# Patient Record
Sex: Female | Born: 1970
Health system: Southern US, Community
[De-identification: ages and names within clinical notes are randomized; demographics above are authoritative.]

## PROBLEM LIST (undated history)

## (undated) DIAGNOSIS — K219 Gastro-esophageal reflux disease without esophagitis: Secondary | ICD-10-CM

## (undated) DIAGNOSIS — R079 Chest pain, unspecified: Secondary | ICD-10-CM

## (undated) DIAGNOSIS — F419 Anxiety disorder, unspecified: Secondary | ICD-10-CM

## (undated) DIAGNOSIS — N390 Urinary tract infection, site not specified: Secondary | ICD-10-CM

## (undated) DIAGNOSIS — K7460 Unspecified cirrhosis of liver: Secondary | ICD-10-CM

## (undated) DIAGNOSIS — E538 Deficiency of other specified B group vitamins: Secondary | ICD-10-CM

## (undated) DIAGNOSIS — R7303 Prediabetes: Secondary | ICD-10-CM

## (undated) DIAGNOSIS — K760 Fatty (change of) liver, not elsewhere classified: Secondary | ICD-10-CM

## (undated) DIAGNOSIS — E559 Vitamin D deficiency, unspecified: Secondary | ICD-10-CM

## (undated) DIAGNOSIS — M549 Dorsalgia, unspecified: Secondary | ICD-10-CM

## (undated) DIAGNOSIS — Z8711 Personal history of peptic ulcer disease: Secondary | ICD-10-CM

## (undated) DIAGNOSIS — M2550 Pain in unspecified joint: Secondary | ICD-10-CM

## (undated) DIAGNOSIS — R1310 Dysphagia, unspecified: Secondary | ICD-10-CM

## (undated) DIAGNOSIS — F32A Depression, unspecified: Secondary | ICD-10-CM

## (undated) DIAGNOSIS — F329 Major depressive disorder, single episode, unspecified: Secondary | ICD-10-CM

## (undated) DIAGNOSIS — E669 Obesity, unspecified: Secondary | ICD-10-CM

## (undated) DIAGNOSIS — K829 Disease of gallbladder, unspecified: Secondary | ICD-10-CM

## (undated) DIAGNOSIS — K8020 Calculus of gallbladder without cholecystitis without obstruction: Secondary | ICD-10-CM

## (undated) DIAGNOSIS — K589 Irritable bowel syndrome without diarrhea: Secondary | ICD-10-CM

## (undated) HISTORY — DX: Personal history of peptic ulcer disease: Z87.11

## (undated) HISTORY — DX: Calculus of gallbladder without cholecystitis without obstruction: K80.20

## (undated) HISTORY — PX: TOTAL VAGINAL HYSTERECTOMY: SHX2548

## (undated) HISTORY — DX: Gastro-esophageal reflux disease without esophagitis: K21.9

## (undated) HISTORY — DX: Irritable bowel syndrome, unspecified: K58.9

## (undated) HISTORY — DX: Obesity, unspecified: E66.9

## (undated) HISTORY — PX: IUD REMOVAL: SHX5392

## (undated) HISTORY — DX: Urinary tract infection, site not specified: N39.0

## (undated) HISTORY — PX: BREAST BIOPSY: SHX20

## (undated) HISTORY — DX: Dysphagia, unspecified: R13.10

## (undated) HISTORY — DX: Fatty (change of) liver, not elsewhere classified: K76.0

## (undated) HISTORY — PX: OTHER SURGICAL HISTORY: SHX169

## (undated) HISTORY — DX: Vitamin D deficiency, unspecified: E55.9

## (undated) HISTORY — DX: Disease of gallbladder, unspecified: K82.9

## (undated) HISTORY — DX: Chest pain, unspecified: R07.9

## (undated) HISTORY — DX: Deficiency of other specified B group vitamins: E53.8

## (undated) HISTORY — PX: UPPER GASTROINTESTINAL ENDOSCOPY: SHX188

## (undated) HISTORY — DX: Anxiety disorder, unspecified: F41.9

## (undated) HISTORY — DX: Unspecified cirrhosis of liver: K74.60

## (undated) HISTORY — PX: REPAIR RECTOCELE: SUR1206

## (undated) HISTORY — DX: Prediabetes: R73.03

## (undated) HISTORY — DX: Depression, unspecified: F32.A

## (undated) HISTORY — DX: Pain in unspecified joint: M25.50

## (undated) HISTORY — DX: Major depressive disorder, single episode, unspecified: F32.9

## (undated) HISTORY — DX: Dorsalgia, unspecified: M54.9

---

## 2002-11-11 DIAGNOSIS — K8020 Calculus of gallbladder without cholecystitis without obstruction: Secondary | ICD-10-CM

## 2002-11-11 HISTORY — PX: CHOLECYSTECTOMY: SHX55

## 2002-11-11 HISTORY — DX: Calculus of gallbladder without cholecystitis without obstruction: K80.20

## 2006-11-21 ENCOUNTER — Ambulatory Visit: Payer: Self-pay | Admitting: Oncology

## 2007-01-13 ENCOUNTER — Ambulatory Visit: Payer: Self-pay | Admitting: Oncology

## 2008-05-31 HISTORY — PX: ESOPHAGOGASTRODUODENOSCOPY: SHX1529

## 2009-08-07 HISTORY — PX: COLONOSCOPY: SHX174

## 2011-05-27 ENCOUNTER — Other Ambulatory Visit: Payer: Self-pay | Admitting: Emergency Medicine

## 2011-05-27 ENCOUNTER — Ambulatory Visit
Admission: RE | Admit: 2011-05-27 | Discharge: 2011-05-27 | Disposition: A | Discharge status: Home / Self Care | Payer: Self-pay | Source: Ambulatory Visit | Attending: Emergency Medicine | Admitting: Emergency Medicine

## 2011-05-27 MED ORDER — IOHEXOL 300 MG/ML  SOLN: 125.0000 mL | Freq: Once | INTRAMUSCULAR | Status: AC | PRN

## 2018-08-10 ENCOUNTER — Encounter: Payer: Self-pay | Admitting: Gastroenterology

## 2018-08-18 ENCOUNTER — Encounter: Payer: Self-pay | Admitting: Gastroenterology

## 2018-08-19 ENCOUNTER — Ambulatory Visit: Payer: BLUE CROSS/BLUE SHIELD | Admitting: Gastroenterology

## 2018-08-19 ENCOUNTER — Encounter: Payer: Self-pay | Admitting: Gastroenterology

## 2018-08-19 VITALS — BP 108/70 | HR 74 | Ht 66.0 in | Wt 293.5 lb

## 2018-08-19 DIAGNOSIS — K760 Fatty (change of) liver, not elsewhere classified: Secondary | ICD-10-CM | POA: Diagnosis not present

## 2018-08-19 DIAGNOSIS — K580 Irritable bowel syndrome with diarrhea: Secondary | ICD-10-CM | POA: Diagnosis not present

## 2018-08-19 DIAGNOSIS — K625 Hemorrhage of anus and rectum: Secondary | ICD-10-CM | POA: Diagnosis not present

## 2018-08-19 MED ORDER — DICYCLOMINE HCL 20 MG PO TABS: ORAL_TABLET | ORAL | 3 refills | Status: DC

## 2018-08-19 NOTE — Patient Instructions (Signed)
If you are age 47 or older, your body mass index should be between 23-30. Your Body mass index is 47.37 kg/m. If this is out of the aforementioned range listed, please consider follow up with your Primary Care Provider.  If you are age 7 or younger, your body mass index should be between 19-25. Your Body mass index is 47.37 kg/m. If this is out of the aformentioned range listed, please consider follow up with your Primary Care Provider.   We have sent the following medications to your pharmacy for you to pick up at your convenience: Bentyl 20 mg 1/2 hour before lunch and at bedtime.   Please have your primary care doctor to add celiac screen to the labs he has drawn.   Please call Dr. Leland Her nurse Vaughan Basta, RN)  in 2 weeks at [phone]  to let her now how you are doing.   You will be due for a recall colonoscopy in 11/2018. We will send you a reminder in the mail when it gets closer to that time.   Thank you,  Dr. [name]

## 2018-08-19 NOTE — Progress Notes (Signed)
Chief Complaint: Diarrhea  Referring Provider:  Dr Jannette Fogo      ASSESSMENT AND PLAN;   #1. IBS with diarrhea (occ constipation), neg stool studies.  Element of Postcholecystectomy diarrhea, neg colon with Bx 07/2009.  #2. Rectal bleeding (resolved).  #3. Fatty liver with Nl LFTs (CT 04/2009)  Plan: - Proceed with "Diet 56" then, if still with problems, gluten free diet. - Gradual weight loss. - Bentyl 20 mg p.o. twice daily half an hour before meals and bedtime. - Proceed with colonoscopy for further evaluation.  This will be scheduled in January 2020. Earlier, if still with problems. - If still with problems, trial of cholestyramine 4 g twice daily. - if still with problems, will give her a trial of amitriptyline 25 mg p.o. nightly. - Patient will make appointment with Dr. Jannette Fogo- for annual physical examination and labs. Also, given her written instructions to get celiac panel. HPI:    Jacqueline Fowler is a 47 y.o. female  Seen at req of Dr Jannette Fogo C/O  diarhea with rare constipation x several years, getting worse over the last 1 year. 2-3/day after eating foods esp salads She also has minimal lower abdominal pain which gets better with defecation. Bloating which gets better with bowel movements as well No nocturnal symptoms No melena or hematochezia Previously had colonoscopy 07/2009-normal to TI, negative random colonic and TI biopsies. Rare bright red blood per rectum. No nausea, vomiting, heartburn, regurgitation, odynophagia or dysphagia. There is no melena. No unintentional weight loss.    Past Medical History:  Diagnosis Date  . Anxiety   . Depression   . Gallstones 2004  . IBS (irritable bowel syndrome)   . Obesity   . UTI (urinary tract infection)     Past Surgical History:  Procedure Laterality Date  . BREAST BIOPSY Right    age 17 or 31 cyst that was removed  . CHOLECYSTECTOMY  2004  . COLONOSCOPY  08/07/2009   Small internal hemorrhoids. Otherwise normal  colonoscopy to TI.   Marland Kitchen ESOPHAGOGASTRODUODENOSCOPY  05/31/2008   Esophageal stricture status post dilation. Mild gastritis.   . IUD REMOVAL      Family History  Problem Relation Age of Onset  . Colon cancer Neg Hx   . Esophageal cancer Neg Hx     Social History   Tobacco Use  . Smoking status: Former Research scientist (life sciences)  . Smokeless tobacco: Never Used  . Tobacco comment: quit 2001 smoking  Substance Use Topics  . Alcohol use: Yes    Comment: ocassionally  . Drug use: Never    Current Outpatient Medications  Medication Sig Dispense Refill  . Brexpiprazole (REXULTI) 2 MG TABS 1 tablet daily.    . busPIRone (BUSPAR) 15 MG tablet     . diazepam (VALIUM) 2 MG tablet   4  . omeprazole (PRILOSEC) 20 MG capsule daily.    . sertraline (ZOLOFT) 100 MG tablet daily.     No current facility-administered medications for this visit.     Not on File  Review of Systems:  Constitutional: Denies fever, chills, diaphoresis, appetite change and fatigue.  HEENT: Denies photophobia, eye pain, redness, hearing loss, ear pain, congestion, sore throat, rhinorrhea, sneezing, mouth sores, neck pain, neck stiffness and tinnitus.   Respiratory: Denies SOB, DOE, chest tightness,  and wheezing.   Cardiovascular: Denies chest pain, palpitations and leg swelling.  Genitourinary: Denies dysuria, urgency, frequency, hematuria, flank pain and difficulty urinating.  Musculoskeletal: Denies myalgias, has back pain. No joint  swelling, arthralgias and gait problem.  Skin: No rash.  Neurological: Denies dizziness, seizures, syncope, weakness, light-headedness, numbness and has headaches.  Hematological: Denies adenopathy. Easy bruising, personal or family bleeding history  Psychiatric/Behavioral: Has anxiety, depression, sleeping problems     Physical Exam:    BP 108/70   Pulse 74   Ht 5\' 6"  (1.676 m)   Wt 293 lb 8 oz (133.1 kg)   BMI 47.37 kg/m  Filed Weights   08/19/18 1010  Weight: 293 lb 8 oz (133.1 kg)     Constitutional:  Well-developed, in no acute distress. Psychiatric: Normal mood and affect. Behavior is normal. HEENT: Pupils normal.  Conjunctivae are normal. No scleral icterus. Neck supple.  Cardiovascular: Normal rate, regular rhythm. No edema Pulmonary/chest: Effort normal and breath sounds normal. No wheezing, rales or rhonchi. Abdominal: Soft, nondistended. Nontender. Bowel sounds active throughout. There are no masses palpable. No hepatomegaly. Rectal:  defered Neurological: Alert and oriented to person place and time. Skin: Skin is warm and dry. No rashes noted.    Carmell Austria, MD 08/19/2018, 10:25 AM  Cc: Dr Jannette Fogo

## 2018-10-02 ENCOUNTER — Telehealth: Payer: Self-pay

## 2018-10-02 NOTE — Telephone Encounter (Signed)
PA approved for Rexulti 2mg  through 09/30/2021

## 2018-12-15 ENCOUNTER — Encounter: Payer: Self-pay | Admitting: Emergency Medicine

## 2018-12-15 DIAGNOSIS — F39 Unspecified mood [affective] disorder: Secondary | ICD-10-CM | POA: Insufficient documentation

## 2018-12-15 DIAGNOSIS — F411 Generalized anxiety disorder: Secondary | ICD-10-CM | POA: Insufficient documentation

## 2019-01-07 ENCOUNTER — Ambulatory Visit: Payer: BLUE CROSS/BLUE SHIELD | Admitting: Physician Assistant

## 2019-01-07 ENCOUNTER — Encounter (INDEPENDENT_AMBULATORY_CARE_PROVIDER_SITE_OTHER): Payer: Self-pay

## 2019-01-07 ENCOUNTER — Other Ambulatory Visit (INDEPENDENT_AMBULATORY_CARE_PROVIDER_SITE_OTHER): Payer: BLUE CROSS/BLUE SHIELD

## 2019-01-07 ENCOUNTER — Encounter: Payer: Self-pay | Admitting: Physician Assistant

## 2019-01-07 VITALS — BP 112/74 | HR 78 | Ht 66.0 in | Wt 306.1 lb

## 2019-01-07 DIAGNOSIS — R194 Change in bowel habit: Secondary | ICD-10-CM

## 2019-01-07 DIAGNOSIS — R109 Unspecified abdominal pain: Secondary | ICD-10-CM

## 2019-01-07 DIAGNOSIS — K625 Hemorrhage of anus and rectum: Secondary | ICD-10-CM | POA: Diagnosis not present

## 2019-01-07 LAB — IGA: IgA: 114 mg/dL (ref 68–378)

## 2019-01-07 MED ORDER — CHOLESTYRAMINE 4 G PO PACK: 4.0000 g | PACK | Freq: Two times a day (BID) | ORAL | 12 refills | Status: DC

## 2019-01-07 MED ORDER — SOD PICOSULFATE-MAG OX-CIT ACD 10-3.5-12 MG-GM -GM/160ML PO SOLN: 1.0000 | ORAL | 0 refills | Status: DC

## 2019-01-07 NOTE — Progress Notes (Signed)
Thx for taking care of her. Agree with the plan

## 2019-01-07 NOTE — Progress Notes (Signed)
Chief Complaint: "Feeling tired and having irregular bowel movements"  HPI:    [name] is a 48 year old female, known to Dr. Lyndel Safe, with a past medical history of irritable bowel syndrome with diarrhea, depression and anxiety, who presents to clinic today with a complaint of "feeling tired and having irregular bowel movements".    08/19/2018 office visit with Dr. Lyndel Safe.  At that time complained of diarrhea with rare constipation over the past several years getting worse over the past year, also with minimal lower abdominal pain with defecation and some bloating.  Previously had colonoscopy 07/2009-normal TI, negative random colonic and TI biopsies, rare bright red blood per rectum.  At that time, it was discussed patient had an element of postcholecystectomy diarrhea with a negative colon biopsy 07/2009 and negative stool studies.  She was given Bentyl 20 mg p.o. twice daily half an hour before meals and bedtime and scheduled for a colonoscopy.  If still with problems recommended trial of Cholestyramine 4 g twice daily and then if still with problems Amitriptyline 25 mg p.o. nightly.    Today, the patient presents clinic and explains that the Bentyl is not helping her.  Admittedly she is only typically taking 20 mg at night and forgets to take one before eating lunch in the middle of the day.  Continues with urgent mostly loose stools at least 8-10 times a day associated with a generalized abdominal pressure/cramping and occasional blood when she wipes.  New to her is some mucus that she is also seeing at times.  Associated symptoms include nausea and fatigue.    Denies weight loss, vomiting or symptoms that awaken her from sleep.     Past Medical History:  Diagnosis Date  . Anxiety   . Depression   . Gallstones 2004  . IBS (irritable bowel syndrome)   . Obesity   . UTI (urinary tract infection)     Past Surgical History:  Procedure Laterality Date  . BREAST BIOPSY Right    age 57 or 40 cyst  that was removed  . CHOLECYSTECTOMY  2004  . COLONOSCOPY  08/07/2009   Small internal hemorrhoids. Otherwise normal colonoscopy to TI.   Marland Kitchen ESOPHAGOGASTRODUODENOSCOPY  05/31/2008   Esophageal stricture status post dilation. Mild gastritis.   . IUD REMOVAL      Current Outpatient Medications  Medication Sig Dispense Refill  . Brexpiprazole (REXULTI) 2 MG TABS 1 tablet daily.    . busPIRone (BUSPAR) 15 MG tablet     . diazepam (VALIUM) 2 MG tablet   4  . dicyclomine (BENTYL) 20 MG tablet Take one 1/2 hour before lunch and at bedtime. 60 tablet 3  . omeprazole (PRILOSEC) 20 MG capsule daily.    . sertraline (ZOLOFT) 100 MG tablet daily.     No current facility-administered medications for this visit.     Allergies as of 01/07/2019  . (No Known Allergies)    Family History  Problem Relation Age of Onset  . Colon cancer Neg Hx   . Esophageal cancer Neg Hx     Social History   Socioeconomic History  . Marital status: Married    Spouse name: Not on file  . Number of children: 4  . Years of education: Not on file  . Highest education level: Not on file  Occupational History  . Not on file  Social Needs  . Financial resource strain: Not on file  . Food insecurity:    Worry: Not on file  Inability: Not on file  . Transportation needs:    Medical: Not on file    Non-medical: Not on file  Tobacco Use  . Smoking status: Former Research scientist (life sciences)  . Smokeless tobacco: Never Used  . Tobacco comment: quit 2001 smoking  Substance and Sexual Activity  . Alcohol use: Yes    Comment: ocassionally  . Drug use: Never  . Sexual activity: Not on file  Lifestyle  . Physical activity:    Days per week: Not on file    Minutes per session: Not on file  . Stress: Not on file  Relationships  . Social connections:    Talks on phone: Not on file    Gets together: Not on file    Attends religious service: Not on file    Active member of club or organization: Not on file    Attends meetings  of clubs or organizations: Not on file    Relationship status: Not on file  . Intimate partner violence:    Fear of current or ex partner: Not on file    Emotionally abused: Not on file    Physically abused: Not on file    Forced sexual activity: Not on file  Other Topics Concern  . Not on file  Social History Narrative  . Not on file    Review of Systems:    Constitutional: No weight loss, fever or chills Cardiovascular: No chest pain  Respiratory: No SOB  Gastrointestinal: See HPI and otherwise negative   Physical Exam:  Vital signs: BP 112/74   Pulse 78   Ht 5\' 6"  (1.676 m)   Wt (!) 306 lb 2 oz (138.9 kg)   BMI 49.41 kg/m   Constitutional:   Pleasant obese Caucasian female appears to be in NAD, Well developed, Well nourished, alert and cooperative Respiratory: Respirations even and unlabored. Lungs clear to auscultation bilaterally.   No wheezes, crackles, or rhonchi.  Cardiovascular: Normal S1, S2. No MRG. Regular rate and rhythm. No peripheral edema, cyanosis or pallor.  Gastrointestinal:  Soft, nondistended, mild generalized ttp. No rebound or guarding. Normal bowel sounds. No appreciable masses or hepatomegaly. Psychiatric: Demonstrates good judgement and reason without abnormal affect or behaviors.  No recent labs or imaging.  Assessment: 1.  Change in bowel habits: 8-10 loose urgent stools per day, no better with Dicyclomine daily; Consider IBS-D +/- postcholecystectomy diarrhea 2.  Abdominal pain: Generalized, typically worse before bowel movement; likely IBS 3.  Rectal bleeding: Occasionally when the patient wipes, last colonoscopy in 2010; likely hemorrhoidal from frequent stools  Plan: 1.  Scheduled patient for colonoscopy in the [place] with Dr. Lyndel Safe, as this was recommended at her last appointment and never got scheduled.  Did discuss risks, benefits, limitations and alternatives and the patient agrees to proceed. 2.  Discussed that I still feel as though this is  likely IBS +/- some postcholecystectomy diarrhea. 3.  At this time will try Cholestyramine 4 g twice daily.  If this does not work could try increasing Dicyclomine to 20 mg 4 times daily to see if this helps with cramping and diarrhea, pending colonoscopy results. 4.  Ordered labs to consider celiac disease 5.  Patient to follow in clinic per recommendations from Dr. Lyndel Safe after time of procedure.  Ellouise Newer, PA-C [facility] 01/07/2019, 1:34 PM  Cc: Raelyn Number, MD

## 2019-01-07 NOTE — Patient Instructions (Addendum)
If you are age 48 or older, your body mass index should be between 23-30. Your Body mass index is 49.41 kg/m. If this is out of the aforementioned range listed, please consider follow up with your Primary Care Provider.  If you are age 35 or younger, your body mass index should be between 19-25. Your Body mass index is 49.41 kg/m. If this is out of the aformentioned range listed, please consider follow up with your Primary Care Provider.   You have been scheduled for a colonoscopy. Please follow written instructions given to you at your visit today.  Please pick up your prep supplies at the pharmacy within the next 1-3 days. If you use inhalers (even only as needed), please bring them with you on the day of your procedure. Your physician has requested that you go to www.startemmi.com and enter the access code given to you at your visit today. This web site gives a general overview about your procedure. However, you should still follow specific instructions given to you by our office regarding your preparation for the procedure.  We have sent the following medications to your pharmacy for you to pick up at your convenience: Clenpiq Cholestyramine  Your provider has requested that you go to the basement level for lab work before leaving today. Press "B" on the elevator. The lab is located at the first door on the left as you exit the elevator.   Thank you for choosing me and [facility].    Ellouise Newer, PA-C

## 2019-01-08 ENCOUNTER — Telehealth: Payer: Self-pay | Admitting: Physician Assistant

## 2019-01-08 NOTE — Telephone Encounter (Signed)
PT states that she needs pre Authorization for the suprep that was sent.

## 2019-01-08 NOTE — Telephone Encounter (Signed)
PA completed and coupon code given to pharmacist for [facility]. Patient advised.

## 2019-01-08 NOTE — Telephone Encounter (Signed)
Any medication related calls need to be forwarded to the [place].  Please route accordingly.  Thank you

## 2019-01-08 NOTE — Telephone Encounter (Signed)
Pt stated that pharmacy required PA even after pt gave them the Suprep coupon.

## 2019-01-13 ENCOUNTER — Encounter: Payer: Self-pay | Admitting: Gastroenterology

## 2019-01-13 ENCOUNTER — Other Ambulatory Visit: Payer: Self-pay

## 2019-01-13 ENCOUNTER — Ambulatory Visit (AMBULATORY_SURGERY_CENTER): Payer: BLUE CROSS/BLUE SHIELD | Admitting: Gastroenterology

## 2019-01-13 VITALS — BP 131/76 | HR 80 | Temp 97.3°F | Resp 16 | Ht 66.0 in | Wt 306.0 lb

## 2019-01-13 DIAGNOSIS — K648 Other hemorrhoids: Secondary | ICD-10-CM

## 2019-01-13 DIAGNOSIS — K635 Polyp of colon: Secondary | ICD-10-CM | POA: Diagnosis not present

## 2019-01-13 DIAGNOSIS — D122 Benign neoplasm of ascending colon: Secondary | ICD-10-CM

## 2019-01-13 DIAGNOSIS — R194 Change in bowel habit: Secondary | ICD-10-CM

## 2019-01-13 MED ORDER — SODIUM CHLORIDE 0.9 % IV SOLN: 500.0000 mL | Freq: Once | INTRAVENOUS | Status: DC

## 2019-01-13 NOTE — Progress Notes (Signed)
No problems noted in the recovery room. maw 

## 2019-01-13 NOTE — Progress Notes (Signed)
Pt awake. VSS report given to RN. No anesthetic complications noted 

## 2019-01-13 NOTE — Patient Instructions (Addendum)
YOU HAD AN ENDOSCOPIC PROCEDURE TODAY AT [facility]:   Refer to the procedure report that was given to you for any specific questions about what was found during the examination.  If the procedure report does not answer your questions, please call your gastroenterologist to clarify.  If you requested that your care partner not be given the details of your procedure findings, then the procedure report has been included in a sealed envelope for you to review at your convenience later.  YOU SHOULD EXPECT: Some feelings of bloating in the abdomen. Passage of more gas than usual.  Walking can help get rid of the air that was put into your GI tract during the procedure and reduce the bloating. If you had a lower endoscopy (such as a colonoscopy or flexible sigmoidoscopy) you may notice spotting of blood in your stool or on the toilet paper. If you underwent a bowel prep for your procedure, you may not have a normal bowel movement for a few days.  Please Note:  You might notice some irritation and congestion in your nose or some drainage.  This is from the oxygen used during your procedure.  There is no need for concern and it should clear up in a day or so.  SYMPTOMS TO REPORT IMMEDIATELY:   Following lower endoscopy (colonoscopy or flexible sigmoidoscopy):  Excessive amounts of blood in the stool  Significant tenderness or worsening of abdominal pains  Swelling of the abdomen that is new, acute  Fever of 100F or higher    For urgent or emergent issues, a gastroenterologist can be reached at any hour by calling [phone].   DIET:  We do recommend a small meal at first, but then you may proceed to your regular diet.  Drink plenty of fluids but you should avoid alcoholic beverages for 24 hours.  ACTIVITY:  You should plan to take it easy for the rest of today and you should NOT DRIVE or use heavy machinery until tomorrow (because of the sedation medicines used during the test).     FOLLOW UP: Our staff will call the number listed on your records the next business day following your procedure to check on you and address any questions or concerns that you may have regarding the information given to you following your procedure. If we do not reach you, we will leave a message.  However, if you are feeling well and you are not experiencing any problems, there is no need to return our call.  We will assume that you have returned to your regular daily activities without incident.  If any biopsies were taken you will be contacted by phone or by letter within the next 1-3 weeks.  Please call us at [phone] if you have not heard about the biopsies in 3 weeks.    SIGNATURES/CONFIDENTIALITY: You and/or your care partner have signed paperwork which will be entered into your electronic medical record.  These signatures attest to the fact that that the information above on your After Visit Summary has been reviewed and is understood.  Full responsibility of the confidentiality of this discharge information lies with you and/or your care-partner.    Handouts were given to your care partner on polyps and hemorrhoids. You may resume your current medications today. Await biopsy results. Return to GI clinic in 12 weeks.  Dr. Steve Rattler staff will call you with this appointment. Please call if any questions or concerns.

## 2019-01-13 NOTE — Op Note (Signed)
[facility] Patient Name: Jacqueline Fowler Procedure Date: 01/13/2019 10:03 AM MRN: [id number] Endoscopist: [name] , MD Age: 48 Referring MD:  Date of Birth: [birth year] Gender: Female Account #: [id number] Procedure:                Colonoscopy Indications:              Clinically significant diarrhea of unexplained                            origin, history of rectal bleeding. Medicines:                Monitored Anesthesia Care Procedure:                Pre-Anesthesia Assessment:                           - Prior to the procedure, a History and Physical                            was performed, and patient medications and                            allergies were reviewed. The patient's tolerance of                            previous anesthesia was also reviewed. The risks                            and benefits of the procedure and the sedation                            options and risks were discussed with the patient.                            All questions were answered, and informed consent                            was obtained. Prior Anticoagulants: The patient has                            taken no previous anticoagulant or antiplatelet                            agents. ASA Grade Assessment: II - A patient with                            mild systemic disease. After reviewing the risks                            and benefits, the patient was deemed in                            satisfactory condition to undergo the procedure.  After obtaining informed consent, the colonoscope                            was passed under direct vision. Throughout the                            procedure, the patient's blood pressure, pulse, and                            oxygen saturations were monitored continuously. The                            Colonoscope was introduced through the anus and                            advanced to the 2 cm into the  ileum. The                            colonoscopy was performed without difficulty. The                            patient tolerated the procedure well. The quality                            of the bowel preparation was excellent. The                            terminal ileum, ileocecal valve, appendiceal                            orifice, and rectum were photographed. Scope In: 10:26:32 AM Scope Out: 10:37:27 AM Scope Withdrawal Time: 0 hours 9 minutes 4 seconds  Total Procedure Duration: 0 hours 10 minutes 55 seconds  Findings:                 A 6 mm polyp was found in the mid ascending colon,                            best visualized on the retroflexed exam. The polyp                            was sessile. The polyp was removed with a cold                            snare. Resection and retrieval were complete.                            Estimated blood loss: none.                           Non-bleeding internal hemorrhoids were found during                            retroflexion. The hemorrhoids were small.  Biopsies for histology were taken with a cold                            forceps from the entire colon for evaluation of                            microscopic colitis. Estimated blood loss: none.                           The terminal ileum appeared normal. Biopsies were                            taken with a cold forceps for histology. Estimated                            blood loss: none.                           The exam was otherwise without abnormality on                            direct and retroflexion views. Complications:            No immediate complications. Estimated Blood Loss:     Estimated blood loss: none. Impression:               -Colonic polyp status post polypectomy.                           -Small internal hemorrhoids.                           -Otherwise normal colonoscopy to TI. Recommendation:           - Patient has a  contact number available for                            emergencies. The signs and symptoms of potential                            delayed complications were discussed with the                            patient. Return to normal activities tomorrow.                            Written discharge instructions were provided to the                            patient.                           - Resume previous diet.                           - Await pathology results.                           -  Repeat colonoscopy for surveillance based on                            pathology results.                           - Return to GI clinic in 12 weeks. [name], MD 01/13/2019 10:44:06 AM This report has been signed electronically.

## 2019-01-13 NOTE — Progress Notes (Signed)
Called to room to assist during endoscopic procedure.  Patient ID and intended procedure confirmed with present staff. Received instructions for my participation in the procedure from the performing physician.  

## 2019-01-14 ENCOUNTER — Telehealth: Payer: Self-pay | Admitting: *Deleted

## 2019-01-14 ENCOUNTER — Telehealth: Payer: Self-pay

## 2019-01-14 NOTE — Telephone Encounter (Signed)
  Follow up Call-  Call back number 01/13/2019  Post procedure Call Back phone  # [phone]  Permission to leave phone message Yes  Some recent data might be hidden     Patient questions:  Do you have a fever, pain , or abdominal swelling? No. Pain Score  0 *  Have you tolerated food without any problems? Yes.    Have you been able to return to your normal activities? Yes.    Do you have any questions about your discharge instructions: Diet   No. Medications  No. Follow up visit  No.  Do you have questions or concerns about your Care? No.  Actions: * If pain score is 4 or above: No action needed, pain <4.

## 2019-01-14 NOTE — Telephone Encounter (Signed)
Called [phone] and left a messaged we tried to reach pt for a follow up call. maw

## 2019-01-15 ENCOUNTER — Ambulatory Visit: Payer: BLUE CROSS/BLUE SHIELD | Admitting: Gastroenterology

## 2019-01-15 ENCOUNTER — Encounter

## 2019-01-17 ENCOUNTER — Encounter: Payer: Self-pay | Admitting: Gastroenterology

## 2019-01-21 ENCOUNTER — Telehealth: Payer: Self-pay | Admitting: Gastroenterology

## 2019-01-21 NOTE — Telephone Encounter (Signed)
Pt requested a CB to discuss test results concerning Crohn's.

## 2019-01-21 NOTE — Telephone Encounter (Signed)
Left message on machine to call back  

## 2019-01-22 NOTE — Telephone Encounter (Signed)
Pt is returning your call

## 2019-01-25 NOTE — Telephone Encounter (Signed)
I spoke with the pt and explained the r/o was Rule our crohns and we discussed the letter she received with results.  The pt has been advised of the information and verbalized understanding.

## 2019-01-28 NOTE — Telephone Encounter (Signed)
Can try Levbid 0.375mg  po bid (#60), 6 refills to see if it helps. Pl call in prescription

## 2019-01-29 ENCOUNTER — Other Ambulatory Visit: Payer: Self-pay

## 2019-01-29 MED ORDER — HYOSCYAMINE SULFATE ER 0.375 MG PO TB12: 0.3750 mg | ORAL_TABLET | Freq: Two times a day (BID) | ORAL | 6 refills | Status: DC

## 2019-01-29 NOTE — Progress Notes (Unsigned)
Sent prescription to patients pharmacy.  

## 2019-03-15 ENCOUNTER — Other Ambulatory Visit: Payer: Self-pay | Admitting: Psychiatry

## 2019-03-25 ENCOUNTER — Other Ambulatory Visit: Payer: Self-pay

## 2019-03-25 ENCOUNTER — Encounter: Payer: Self-pay | Admitting: Psychiatry

## 2019-03-25 ENCOUNTER — Ambulatory Visit: Payer: BLUE CROSS/BLUE SHIELD | Admitting: Psychiatry

## 2019-03-25 DIAGNOSIS — F3342 Major depressive disorder, recurrent, in full remission: Secondary | ICD-10-CM

## 2019-03-25 MED ORDER — BREXPIPRAZOLE 2 MG PO TABS: 2.0000 mg | ORAL_TABLET | Freq: Every day | ORAL | 3 refills | Status: DC

## 2019-03-25 MED ORDER — SERTRALINE HCL 100 MG PO TABS: 150.0000 mg | ORAL_TABLET | Freq: Every day | ORAL | 3 refills | Status: DC

## 2019-03-25 NOTE — Progress Notes (Signed)
Jacqueline Fowler [id number] 08-11-1971 48 y.o.  Virtual Visit via Telephone Note  I connected with@ on 03/25/19 at 10:00 AM EDT by telephone and verified that I am speaking with the correct person using two identifiers.   I discussed the limitations, risks, security and privacy concerns of performing an evaluation and management service by telephone and the availability of in person appointments. I also discussed with the patient that there may be a patient responsible charge related to this service. The patient expressed understanding and agreed to proceed.   I discussed the assessment and treatment plan with the patient. The patient was provided an opportunity to ask questions and all were answered. The patient agreed with the plan and demonstrated an understanding of the instructions.   The patient was advised to call back or seek an in-person evaluation if the symptoms worsen or if the condition fails to improve as anticipated.  I provided 30 minutes of non-face-to-face time during this encounter.  The patient was located at home.  The provider was located at home.   Thayer Headings, PMHNP   Subjective:   Patient ID:  Jacqueline Fowler is a 48 y.o. (DOB 1971/03/24) female.  Chief Complaint:  Chief Complaint  Patient presents with  . Follow-up    h/o Anxiety and Depression    HPI Jacqueline Fowler presents for follow-up of depression and anxiety. "I'm in such a better place." She reports that her mood has been "good." She reports that she has felt discouraged some days due to frustration of trying to lose weight. She reports that she lost 15 lbs in 16 days. She reports that her appetite has been good. She reports that her sleep has improved and sleeping through the night now. Denies recent anxiety. She reports that she was able to wean herself off Diazepam and has not needed to take it prn in a long-time. She reports that her energy and motivation have been good. She reports adequate  concentration. Denies SI.   She reports that she typically only takes Buspar once daily because it is difficult for her to take BID.   She reports improved relationships with her older children and gets to see grandchildren regularly. Reports that she is about to celebrate her 70th wedding anniversary. She reports that she has good social support.    Review of Systems:  Review of Systems  Musculoskeletal: Negative for gait problem.  Neurological: Negative for tremors and headaches.  Psychiatric/Behavioral:       Please refer to HPI    Medications: I have reviewed the patient's current medications.  Current Outpatient Medications  Medication Sig Dispense Refill  . Brexpiprazole (REXULTI) 2 MG TABS Take 2 mg by mouth daily. 90 tablet 3  . busPIRone (BUSPAR) 15 MG tablet Take 15 mg by mouth daily.     . hyoscyamine (LEVBID) 0.375 MG 12 hr tablet Take 1 tablet (0.375 mg total) by mouth 2 (two) times daily. 60 tablet 6  . omeprazole (PRILOSEC) 20 MG capsule daily.    . cholestyramine (QUESTRAN) 4 g packet Take 1 packet (4 g total) by mouth 2 (two) times daily. (Patient not taking: Reported on 03/25/2019) 60 each 12  . diazepam (VALIUM) 2 MG tablet Take 2 mg by mouth as needed.   4  . dicyclomine (BENTYL) 20 MG tablet Take one 1/2 hour before lunch and at bedtime. (Patient not taking: Reported on 01/13/2019) 60 tablet 3  . sertraline (ZOLOFT) 100 MG tablet Take 1.5 tablets (150  mg total) by mouth daily. 135 tablet 3   No current facility-administered medications for this visit.     Medication Side Effects: Other: Dry mouth upon awakening  Allergies: No Known Allergies  Past Medical History:  Diagnosis Date  . Anxiety   . Depression   . Gallstones 2004  . IBS (irritable bowel syndrome)   . Obesity   . UTI (urinary tract infection)     Family History  Problem Relation Age of Onset  . Colon cancer Neg Hx   . Esophageal cancer Neg Hx   . Rectal cancer Neg Hx   . Stomach cancer  Neg Hx     Social History   Socioeconomic History  . Marital status: Married    Spouse name: Not on file  . Number of children: 4  . Years of education: Not on file  . Highest education level: Not on file  Occupational History  . Not on file  Social Needs  . Financial resource strain: Not on file  . Food insecurity:    Worry: Not on file    Inability: Not on file  . Transportation needs:    Medical: Not on file    Non-medical: Not on file  Tobacco Use  . Smoking status: Former Research scientist (life sciences)  . Smokeless tobacco: Never Used  . Tobacco comment: quit 2001 smoking  Substance and Sexual Activity  . Alcohol use: Yes    Alcohol/week: 1.0 standard drinks    Types: 1 Glasses of wine per week    Comment: ocassionally  . Drug use: Never  . Sexual activity: Not on file  Lifestyle  . Physical activity:    Days per week: Not on file    Minutes per session: Not on file  . Stress: Not on file  Relationships  . Social connections:    Talks on phone: Not on file    Gets together: Not on file    Attends religious service: Not on file    Active member of club or organization: Not on file    Attends meetings of clubs or organizations: Not on file    Relationship status: Not on file  . Intimate partner violence:    Fear of current or ex partner: Not on file    Emotionally abused: Not on file    Physically abused: Not on file    Forced sexual activity: Not on file  Other Topics Concern  . Not on file  Social History Narrative  . Not on file    Past Medical History, Surgical history, Social history, and Family history were reviewed and updated as appropriate.   Please see review of systems for further details on the patient's review from today.   Objective:   Physical Exam:  There were no vitals taken for this visit.  Physical Exam Neurological:     Mental Status: She is alert and oriented to person, place, and time.     Cranial Nerves: No dysarthria.  Psychiatric:         Attention and Perception: Attention normal.        Mood and Affect: Mood normal.        Speech: Speech normal.        Behavior: Behavior is cooperative.        Thought Content: Thought content normal. Thought content is not paranoid or delusional. Thought content does not include homicidal or suicidal ideation. Thought content does not include homicidal or suicidal plan.  Cognition and Memory: Cognition and memory normal.        Judgment: Judgment normal.     Lab Review:  No results found for: NA, K, CL, CO2, GLUCOSE, BUN, CREATININE, CALCIUM, PROT, ALBUMIN, AST, ALT, ALKPHOS, BILITOT, GFRNONAA, GFRAA  No results found for: WBC, RBC, HGB, HCT, PLT, MCV, MCH, MCHC, RDW, LYMPHSABS, MONOABS, EOSABS, BASOSABS  No results found for: POCLITH, LITHIUM   No results found for: PHENYTOIN, PHENOBARB, VALPROATE, CBMZ   .res Assessment: Plan:   Gust treatment plan with patient and she reports, "I feel good the way I am doing, so I would rather not make any changes."  Discussed that taking BuSpar once daily likely has minimum benefit and she may be able to discontinue.  Discussed that BuSpar could be resumed if she experiences any worsening in anxiety signs and symptoms.  Will decrease Buspar to 1/2 tab po qd until current supply is depleted, then stop.  Continue Rexulti 2 mg daily for mood and anxiety. Continue Zoloft 150 mg daily for mood and anxiety. Continue Valium 2 mg as needed. Patient to follow-up in 1 year or sooner if clinically indicated. Patient advised to contact office with any questions, adverse effects, or acute worsening in signs and symptoms.   Recurrent major depressive disorder, in full remission ([place]) - Plan: sertraline (ZOLOFT) 100 MG tablet, Brexpiprazole (REXULTI) 2 MG TABS  Please see After Visit Summary for patient specific instructions.  Future Appointments  Date Time Provider [place]  03/24/2020 10:00 AM Thayer Headings, PMHNP [facility] None    No  orders of the defined types were placed in this encounter.     -------------------------------

## 2019-09-28 ENCOUNTER — Telehealth: Payer: Self-pay

## 2019-09-28 NOTE — Telephone Encounter (Signed)
Prior authorization submitted and approved for Rexulti 2 mg 1/day effective 09/28/2019-09/26/2022 through [employer] to [employer].

## 2019-12-03 DIAGNOSIS — M150 Primary generalized (osteo)arthritis: Secondary | ICD-10-CM | POA: Diagnosis not present

## 2019-12-03 DIAGNOSIS — F419 Anxiety disorder, unspecified: Secondary | ICD-10-CM | POA: Diagnosis not present

## 2019-12-03 DIAGNOSIS — R7303 Prediabetes: Secondary | ICD-10-CM | POA: Diagnosis not present

## 2019-12-03 DIAGNOSIS — R41840 Attention and concentration deficit: Secondary | ICD-10-CM | POA: Diagnosis not present

## 2019-12-03 DIAGNOSIS — Z79899 Other long term (current) drug therapy: Secondary | ICD-10-CM | POA: Diagnosis not present

## 2019-12-03 DIAGNOSIS — R5383 Other fatigue: Secondary | ICD-10-CM | POA: Diagnosis not present

## 2019-12-03 DIAGNOSIS — K219 Gastro-esophageal reflux disease without esophagitis: Secondary | ICD-10-CM | POA: Diagnosis not present

## 2019-12-15 DIAGNOSIS — M25512 Pain in left shoulder: Secondary | ICD-10-CM | POA: Diagnosis not present

## 2019-12-15 DIAGNOSIS — M25511 Pain in right shoulder: Secondary | ICD-10-CM | POA: Diagnosis not present

## 2019-12-20 ENCOUNTER — Other Ambulatory Visit: Payer: Self-pay | Admitting: Gastroenterology

## 2019-12-27 DIAGNOSIS — Z1231 Encounter for screening mammogram for malignant neoplasm of breast: Secondary | ICD-10-CM | POA: Diagnosis not present

## 2019-12-27 DIAGNOSIS — Z124 Encounter for screening for malignant neoplasm of cervix: Secondary | ICD-10-CM | POA: Diagnosis not present

## 2019-12-27 DIAGNOSIS — Z01419 Encounter for gynecological examination (general) (routine) without abnormal findings: Secondary | ICD-10-CM | POA: Diagnosis not present

## 2020-01-06 DIAGNOSIS — N6311 Unspecified lump in the right breast, upper outer quadrant: Secondary | ICD-10-CM | POA: Diagnosis not present

## 2020-01-06 DIAGNOSIS — R921 Mammographic calcification found on diagnostic imaging of breast: Secondary | ICD-10-CM | POA: Diagnosis not present

## 2020-01-06 DIAGNOSIS — N6313 Unspecified lump in the right breast, lower outer quadrant: Secondary | ICD-10-CM | POA: Diagnosis not present

## 2020-01-20 DIAGNOSIS — M79602 Pain in left arm: Secondary | ICD-10-CM | POA: Diagnosis not present

## 2020-01-20 DIAGNOSIS — M25512 Pain in left shoulder: Secondary | ICD-10-CM | POA: Diagnosis not present

## 2020-01-20 DIAGNOSIS — M7542 Impingement syndrome of left shoulder: Secondary | ICD-10-CM | POA: Diagnosis not present

## 2020-01-28 DIAGNOSIS — M25512 Pain in left shoulder: Secondary | ICD-10-CM | POA: Diagnosis not present

## 2020-02-03 ENCOUNTER — Other Ambulatory Visit: Payer: Self-pay | Admitting: Specialist

## 2020-02-03 DIAGNOSIS — M7502 Adhesive capsulitis of left shoulder: Secondary | ICD-10-CM

## 2020-02-10 ENCOUNTER — Other Ambulatory Visit: Payer: Self-pay

## 2020-02-10 ENCOUNTER — Ambulatory Visit
Admission: RE | Admit: 2020-02-10 | Discharge: 2020-02-10 | Disposition: A | Discharge status: Home / Self Care | Payer: BC Managed Care – PPO | Source: Ambulatory Visit | Attending: Specialist | Admitting: Specialist

## 2020-02-10 DIAGNOSIS — M7502 Adhesive capsulitis of left shoulder: Secondary | ICD-10-CM | POA: Diagnosis not present

## 2020-02-10 MED ORDER — IOPAMIDOL (ISOVUE-M 200) INJECTION 41%
1.0000 mL | Freq: Once | INTRAMUSCULAR | Status: AC
  Administered 2020-02-10: 1 mL via EPIDURAL

## 2020-02-10 MED ORDER — METHYLPREDNISOLONE ACETATE 40 MG/ML INJ SUSP (RADIOLOG
120.0000 mg | Freq: Once | INTRAMUSCULAR | Status: AC
  Administered 2020-02-10: 120 mg via EPIDURAL

## 2020-02-14 DIAGNOSIS — Z01812 Encounter for preprocedural laboratory examination: Secondary | ICD-10-CM | POA: Diagnosis not present

## 2020-03-15 DIAGNOSIS — B974 Respiratory syncytial virus as the cause of diseases classified elsewhere: Secondary | ICD-10-CM | POA: Diagnosis not present

## 2020-03-15 DIAGNOSIS — Z20828 Contact with and (suspected) exposure to other viral communicable diseases: Secondary | ICD-10-CM | POA: Diagnosis not present

## 2020-03-15 DIAGNOSIS — J101 Influenza due to other identified influenza virus with other respiratory manifestations: Secondary | ICD-10-CM | POA: Diagnosis not present

## 2020-03-15 DIAGNOSIS — U071 COVID-19: Secondary | ICD-10-CM | POA: Diagnosis not present

## 2020-03-20 HISTORY — PX: OTHER SURGICAL HISTORY: SHX169

## 2020-03-24 ENCOUNTER — Ambulatory Visit: Payer: BLUE CROSS/BLUE SHIELD | Admitting: Psychiatry

## 2020-03-29 ENCOUNTER — Telehealth: Payer: Self-pay | Admitting: Psychiatry

## 2020-03-29 ENCOUNTER — Encounter: Payer: Self-pay | Admitting: Psychiatry

## 2020-03-29 ENCOUNTER — Telehealth (INDEPENDENT_AMBULATORY_CARE_PROVIDER_SITE_OTHER): Payer: BC Managed Care – PPO | Admitting: Psychiatry

## 2020-03-29 VITALS — Wt 182.6 lb

## 2020-03-29 DIAGNOSIS — F3342 Major depressive disorder, recurrent, in full remission: Secondary | ICD-10-CM | POA: Diagnosis not present

## 2020-03-29 DIAGNOSIS — F419 Anxiety disorder, unspecified: Secondary | ICD-10-CM

## 2020-03-29 MED ORDER — BREXPIPRAZOLE 2 MG PO TABS: 2.0000 mg | ORAL_TABLET | Freq: Every day | ORAL | 3 refills | Status: DC

## 2020-03-29 MED ORDER — SERTRALINE HCL 100 MG PO TABS: 150.0000 mg | ORAL_TABLET | Freq: Every day | ORAL | 3 refills | Status: DC

## 2020-03-29 NOTE — Telephone Encounter (Signed)
[name], schmeiser are scheduled for a virtual visit with your provider today.    Just as we do with appointments in the office, we must obtain your consent to participate.  Your consent will be active for this visit and any virtual visit you may have with one of our providers in the next 365 days.    If you have a MyChart account, I can also send a copy of this consent to you electronically.  All virtual visits are billed to your insurance company just like a traditional visit in the office.  As this is a virtual visit, video technology does not allow for your provider to perform a traditional examination.  This may limit your provider's ability to fully assess your condition.  If your provider identifies any concerns that need to be evaluated in person or the need to arrange testing such as labs, EKG, etc, we will make arrangements to do so.    Although advances in technology are sophisticated, we cannot ensure that it will always work on either your end or our end.  If the connection with a video visit is poor, we may have to switch to a telephone visit.  With either a video or telephone visit, we are not always able to ensure that we have a secure connection.   I need to obtain your verbal consent now.   Are you willing to proceed with your visit today?   Jacqueline Fowler has provided verbal consent on 03/29/2020 for a virtual visit (video or telephone).   Thayer Headings, PMHNP 03/29/2020  12:54 PM

## 2020-03-29 NOTE — Progress Notes (Signed)
Jacqueline Fowler [id number] 04-Oct-1971 49 y.o.  Virtual Visit via Video Note  I connected with pt @ on 03/29/20 at 10:00 AM EDT by a video enabled telemedicine application and verified that I am speaking with the correct person using two identifiers.   I discussed the limitations of evaluation and management by telemedicine and the availability of in person appointments. The patient expressed understanding and agreed to proceed.  I discussed the assessment and treatment plan with the patient. The patient was provided an opportunity to ask questions and all were answered. The patient agreed with the plan and demonstrated an understanding of the instructions.   The patient was advised to call back or seek an in-person evaluation if the symptoms worsen or if the condition fails to improve as anticipated.  I provided 30 minutes of non-face-to-face time during this encounter.  The patient was located at home.  The provider was located at [place].   Thayer Headings, PMHNP   Subjective:   Patient ID:  Jacqueline Fowler is a 49 y.o. (DOB [birth year]) female.  Chief Complaint:  Chief Complaint  Patient presents with  . Follow-up    h/o anxiety and depression    HPI Jacqueline Fowler presents for follow-up of depression and anxiety. She reports that she lost 125 lbs and had recent cosmetic surgery. She reports that she has been doing well. She reports that she no longer has diabetes. She reports that her mood has been good. "I haven't felt this good in a really long time." She reports occasional periods of "overwhelming sadness." She reports that these periods are infrequent. She reports that issues from her past have been occasionally re-surfacing. She does not have a relationship with her mother and is not close to one of her brother. Has a strained relationship with one son and has a good relationship with 2 of her other sons. She has seen during therapists in the past. She reports that she  has periods of anxiety when things come up from the past. She reports having some intrusive memories. Denies re-experiencing past events. Denies exaggerated startle response. She reports, "I just want contentment and peace." Denies persistent anxiety. Denies panic s/s. She reports that she has been sleeping well since she has started wt loss program. Has occasional vivid dreams. She reports that her appetite has been stable. Energy and motivation have been good. She reports that she has occ brain fog. She reports "improved mental clarity." She reports improved concentration. Denies SI.   She has become a Engineer, maintenance through the program, [employer],  she used for weight loss. She plans to start exercising once she recovers from surgery.   Review of Systems:  Review of Systems  Musculoskeletal: Negative for gait problem.  Neurological: Negative for tremors.  Psychiatric/Behavioral:       Please refer to HPI    Medications: I have reviewed the patient's current medications.  Current Outpatient Medications  Medication Sig Dispense Refill  . diazepam (VALIUM) 2 MG tablet Take 2 mg by mouth as needed.   4  . hyoscyamine (LEVBID) 0.375 MG 12 hr tablet TAKE ONE (1) TABLET BY MOUTH TWO (2) TIMES DAILY 60 tablet 6  . brexpiprazole (REXULTI) 2 MG TABS tablet Take 1 tablet (2 mg total) by mouth daily. 90 tablet 3  . sertraline (ZOLOFT) 100 MG tablet Take 1.5 tablets (150 mg total) by mouth daily. 135 tablet 3   No current facility-administered medications for this visit.    Medication  Side Effects: None  Denies any involuntary movements.   Allergies: No Known Allergies  Past Medical History:  Diagnosis Date  . Anxiety   . Depression   . Gallstones 2004  . IBS (irritable bowel syndrome)   . Obesity   . UTI (urinary tract infection)     Family History  Problem Relation Age of Onset  . Colon cancer Neg Hx   . Esophageal cancer Neg Hx   . Rectal cancer Neg Hx   . Stomach cancer Neg Hx      Social History   Socioeconomic History  . Marital status: Married    Spouse name: Not on file  . Number of children: 4  . Years of education: Not on file  . Highest education level: Not on file  Occupational History  . Not on file  Tobacco Use  . Smoking status: Former Research scientist (life sciences)  . Smokeless tobacco: Never Used  . Tobacco comment: quit 2001 smoking  Substance and Sexual Activity  . Alcohol use: Yes    Alcohol/week: 1.0 standard drinks    Types: 1 Glasses of wine per week    Comment: ocassionally  . Drug use: Never  . Sexual activity: Not on file  Other Topics Concern  . Not on file  Social History Narrative  . Not on file   Social Determinants of Health   Financial Resource Strain:   . Difficulty of Paying Living Expenses:   Food Insecurity:   . Worried About Charity fundraiser in the Last Year:   . Arboriculturist in the Last Year:   Transportation Needs:   . Film/video editor (Medical):   Marland Kitchen Lack of Transportation (Non-Medical):   Physical Activity:   . Days of Exercise per Week:   . Minutes of Exercise per Session:   Stress:   . Feeling of Stress :   Social Connections:   . Frequency of Communication with Friends and Family:   . Frequency of Social Gatherings with Friends and Family:   . Attends Religious Services:   . Active Member of Clubs or Organizations:   . Attends Archivist Meetings:   Marland Kitchen Marital Status:   Intimate Partner Violence:   . Fear of Current or Ex-Partner:   . Emotionally Abused:   Marland Kitchen Physically Abused:   . Sexually Abused:     Past Medical History, Surgical history, Social history, and Family history were reviewed and updated as appropriate.   Please see review of systems for further details on the patient's review from today.   Objective:   Physical Exam:  Wt 182 lb 9.6 oz (82.8 kg)   BMI 29.47 kg/m   Physical Exam Neurological:     Mental Status: She is alert and oriented to person, place, and time.      Cranial Nerves: No dysarthria.  Psychiatric:        Attention and Perception: Attention and perception normal.        Mood and Affect: Mood normal.        Speech: Speech normal.        Behavior: Behavior is cooperative.        Thought Content: Thought content normal. Thought content is not paranoid or delusional. Thought content does not include homicidal or suicidal ideation. Thought content does not include homicidal or suicidal plan.        Cognition and Memory: Cognition and memory normal.        Judgment: Judgment normal.  Comments: Insight intact     Lab Review:  No results found for: NA, K, CL, CO2, GLUCOSE, BUN, CREATININE, CALCIUM, PROT, ALBUMIN, AST, ALT, ALKPHOS, BILITOT, GFRNONAA, GFRAA  No results found for: WBC, RBC, HGB, HCT, PLT, MCV, MCH, MCHC, RDW, LYMPHSABS, MONOABS, EOSABS, BASOSABS  No results found for: POCLITH, LITHIUM   No results found for: PHENYTOIN, PHENOBARB, VALPROATE, CBMZ   .res Assessment: Plan:   Patient seen for 30 minutes and time spent discussing possible treatment options for anxiety and patient having some intrusive memories about the past and stress related to family dynamics.  Discussed that EMDR may be helpful for these signs and symptoms and briefly discussed overview of EMDR.  Discussed possible therapy referrals.  Patient reports that she will consider contacting therapists, based on insurance coverage. Continue Rexulti 2 mg daily for depression and anxiety. Continue sertraline 150 mg daily for depression and anxiety. Patient to follow-up in 1 year or sooner if clinically indicated. Patient advised to contact office with any questions, adverse effects, or acute worsening in signs and symptoms.  Jacqueline Fowler was seen today for follow-up.  Diagnoses and all orders for this visit:  Anxiety disorder, unspecified type  Recurrent major depressive disorder, in full remission ([place]) -     brexpiprazole (REXULTI) 2 MG TABS tablet; Take 1 tablet (2 mg  total) by mouth daily. -     sertraline (ZOLOFT) 100 MG tablet; Take 1.5 tablets (150 mg total) by mouth daily.     Please see After Visit Summary for patient specific instructions.  No future appointments.  No orders of the defined types were placed in this encounter.     -------------------------------

## 2020-06-08 ENCOUNTER — Other Ambulatory Visit: Payer: Self-pay | Admitting: Orthopedic Surgery

## 2020-06-08 DIAGNOSIS — M7502 Adhesive capsulitis of left shoulder: Secondary | ICD-10-CM

## 2020-06-14 ENCOUNTER — Ambulatory Visit
Admission: RE | Admit: 2020-06-14 | Discharge: 2020-06-14 | Disposition: A | Discharge status: Home / Self Care | Payer: BC Managed Care – PPO | Source: Ambulatory Visit | Attending: Orthopedic Surgery | Admitting: Orthopedic Surgery

## 2020-06-14 DIAGNOSIS — M7502 Adhesive capsulitis of left shoulder: Secondary | ICD-10-CM | POA: Diagnosis not present

## 2020-06-14 DIAGNOSIS — M7061 Trochanteric bursitis, right hip: Secondary | ICD-10-CM | POA: Diagnosis not present

## 2020-06-14 MED ORDER — IOPAMIDOL (ISOVUE-M 200) INJECTION 41%
1.0000 mL | Freq: Once | INTRAMUSCULAR | Status: AC
  Administered 2020-06-14: 1 mL via INTRA_ARTICULAR

## 2020-08-30 DIAGNOSIS — K2100 Gastro-esophageal reflux disease with esophagitis, without bleeding: Secondary | ICD-10-CM | POA: Diagnosis not present

## 2020-08-30 DIAGNOSIS — K273 Acute peptic ulcer, site unspecified, without hemorrhage or perforation: Secondary | ICD-10-CM | POA: Diagnosis not present

## 2020-08-31 DIAGNOSIS — R1033 Periumbilical pain: Secondary | ICD-10-CM | POA: Diagnosis not present

## 2020-08-31 DIAGNOSIS — R1030 Lower abdominal pain, unspecified: Secondary | ICD-10-CM | POA: Diagnosis not present

## 2020-09-11 ENCOUNTER — Telehealth: Payer: Self-pay | Admitting: Gastroenterology

## 2020-09-11 NOTE — Telephone Encounter (Signed)
Patient called states she has a few question prior too schedule has been having abdominal pain also has a referral for that placed in Epic

## 2020-09-12 NOTE — Telephone Encounter (Signed)
Spoke to patient who reports Rt side abdominal pain near bellybutton. She had a tummy tuck back in 6/21. Patient reports intermittent burning and a pulling sensation. She was advised that her pain may be related to scar tissue from her surgery and to apply a heating pad to the area and take motrin as needed. She also contacted her surgeon who also felt it was sacr tissue related. Patient is requesting an appointment if her symptoms continue. Appointment scheduled for next available. She will Cancell if her SX improve.

## 2020-10-27 ENCOUNTER — Ambulatory Visit: Payer: BC Managed Care – PPO | Admitting: Gastroenterology

## 2020-12-19 ENCOUNTER — Telehealth: Payer: Self-pay | Admitting: Gastroenterology

## 2020-12-19 NOTE — Telephone Encounter (Signed)
Pt states she was informed by her pharmacy that the insurance will not cover the LEVBID, caller would like to know if there is an alternative recommendation.  [facility]

## 2020-12-21 NOTE — Telephone Encounter (Signed)
Spoke to patient to inform her that she will need to contact her insurance company to find out what alternative medication they will cover. Patient voiced understanding.

## 2020-12-21 NOTE — Telephone Encounter (Signed)
Patient is calling to follow up on previous message please call her once done per her request

## 2020-12-26 ENCOUNTER — Telehealth: Payer: Self-pay | Admitting: Gastroenterology

## 2020-12-26 NOTE — Telephone Encounter (Signed)
Pt is requesting a call back from a nurse, pt states her LEVBID Is being denied by her insurance, pt would like a list of possible alternatives to replace this medicine.

## 2020-12-27 NOTE — Telephone Encounter (Signed)
Would you like to change this medication to something else?

## 2020-12-29 ENCOUNTER — Telehealth: Payer: Self-pay | Admitting: General Surgery

## 2020-12-29 NOTE — Telephone Encounter (Signed)
The patient contacted the office regarding Levbid and alternatives to this medication. She was told to contact her insurance to receive alternatives and has received a PDF that gives all of the medication alternatives. She will send it to my attention through mychart.

## 2021-01-05 DIAGNOSIS — Z01419 Encounter for gynecological examination (general) (routine) without abnormal findings: Secondary | ICD-10-CM | POA: Diagnosis not present

## 2021-01-05 DIAGNOSIS — Z1231 Encounter for screening mammogram for malignant neoplasm of breast: Secondary | ICD-10-CM | POA: Diagnosis not present

## 2021-01-08 DIAGNOSIS — M5459 Other low back pain: Secondary | ICD-10-CM | POA: Diagnosis not present

## 2021-01-10 ENCOUNTER — Other Ambulatory Visit: Payer: Self-pay | Admitting: Gastroenterology

## 2021-01-10 MED ORDER — HYOSCYAMINE SULFATE SL 0.125 MG SL SUBL
0.1250 mg | SUBLINGUAL_TABLET | Freq: Four times a day (QID) | SUBLINGUAL | 2 refills | Status: DC | PRN
Start: 2021-01-10 — End: 2021-05-21

## 2021-01-10 NOTE — Telephone Encounter (Signed)
Lets try Levsin 0.125 mg (generic) s/l every 4-6 hours as needed, 120, 2 refills RG

## 2021-01-10 NOTE — Telephone Encounter (Signed)
Medication sent to pharmacy. Patient will be notified when ready for pick up.

## 2021-01-11 NOTE — Telephone Encounter (Signed)
Dr Lyndel Safe perscribed hyoscyamine.

## 2021-02-28 DIAGNOSIS — M7542 Impingement syndrome of left shoulder: Secondary | ICD-10-CM | POA: Diagnosis not present

## 2021-02-28 DIAGNOSIS — M542 Cervicalgia: Secondary | ICD-10-CM | POA: Diagnosis not present

## 2021-03-09 ENCOUNTER — Telehealth: Payer: Self-pay

## 2021-03-09 NOTE — Telephone Encounter (Signed)
Prior approval for renewal on REXULTI 2 MG received effective 03/09/2021-03/08/2022 with [facility]

## 2021-03-23 DIAGNOSIS — Z1231 Encounter for screening mammogram for malignant neoplasm of breast: Secondary | ICD-10-CM | POA: Diagnosis not present

## 2021-03-29 ENCOUNTER — Other Ambulatory Visit: Payer: Self-pay

## 2021-03-29 ENCOUNTER — Telehealth: Payer: Self-pay | Admitting: Psychiatry

## 2021-03-29 ENCOUNTER — Ambulatory Visit (INDEPENDENT_AMBULATORY_CARE_PROVIDER_SITE_OTHER): Payer: BC Managed Care – PPO | Admitting: Psychiatry

## 2021-03-29 ENCOUNTER — Encounter: Payer: Self-pay | Admitting: Psychiatry

## 2021-03-29 DIAGNOSIS — F3342 Major depressive disorder, recurrent, in full remission: Secondary | ICD-10-CM

## 2021-03-29 DIAGNOSIS — F419 Anxiety disorder, unspecified: Secondary | ICD-10-CM | POA: Diagnosis not present

## 2021-03-29 MED ORDER — SERTRALINE HCL 100 MG PO TABS: 200.0000 mg | ORAL_TABLET | Freq: Every day | ORAL | 0 refills | Status: DC

## 2021-03-29 MED ORDER — BREXPIPRAZOLE 2 MG PO TABS: 2.0000 mg | ORAL_TABLET | Freq: Every day | ORAL | 1 refills | Status: DC

## 2021-03-29 NOTE — Telephone Encounter (Signed)
Pt would like the rx for [facility]ft [facility] [address], [place] ,[place]

## 2021-03-29 NOTE — Patient Instructions (Signed)
Recommend calling [facility] at [phone] and asking to schedule with Bambi Cottle, LCSW.  Lina Sayre, [facility] in our office at [place] would be a good fit.

## 2021-03-29 NOTE — Progress Notes (Signed)
   03/29/21 1036  Facial and Oral Movements  Muscles of Facial Expression 0  Lips and Perioral Area 0  Jaw 0  Tongue 0  Extremity Movements  Upper (arms, wrists, hands, fingers) 0  Lower (legs, knees, ankles, toes) 0  Trunk Movements  Neck, shoulders, hips 0  Overall Severity  Severity of abnormal movements (highest score from questions above) 0  Incapacitation due to abnormal movements 0  Patient's awareness of abnormal movements (rate only patient's report) 0  AIMS Total Score  AIMS Total Score 0

## 2021-03-29 NOTE — Progress Notes (Signed)
Jacqueline Fowler [id number] 1971-08-14 50 y.o.  Subjective:   Patient ID:  Jacqueline Fowler is a 50 y.o. (DOB 11/24/1970) female.  Chief Complaint:  Chief Complaint  Patient presents with  . Depression  . Anxiety    HPI Jacqueline Fowler presents to the office today for follow-up of anxiety and depression. She has noticed some anxiety recently. She reports frequent worry, rumination, and catastrophic thinking. She report that she has had difficulty with focus. Has had increased depression. She reports times where she wants to stay in bed and cry. Low energy and low motivation. Has been wanting to go back to bed after she has gotten up and had breakfast. Has been socially withdrawn. She reports occasional flashbacks of son's death. Denies panic attacks.  She reports middle of the night awakenings and that sometimes she has difficulty returning to sleep. She estimates sleeping about 5-6 hours. Appetite has been consistent. Diminished interest and enjoyment in things. Denies SI. She reports, "sometimes I think the world would be better off if I wasn't in it."  She and her best friend of over 72 years recently ended their friendship. She does not have a relationship with her father and a strained relationship with her mother. She does not have a relationship with her brothers and her oldet son. Reports that oldest son blames her for biological father abandoning him and the death of his brother. Has not been able to see his children.    Lost 125 lbs. 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 She is now working remotely. Husband has been helping her with her work and this is going well. Son is graduating HS next week.   AIMS   Flowsheet Row Office Visit from 03/29/2021 in [facility]  AIMS Total Score 0       Review of Systems:  Review of Systems  Musculoskeletal: Negative for gait problem.  Neurological: Negative for tremors.  Psychiatric/Behavioral:       Please refer to HPI     Medications: I have reviewed the patient's current medications.  Current Outpatient Medications  Medication Sig Dispense Refill  . hyoscyamine (LEVBID) 0.375 MG 12 hr tablet TAKE ONE (1) TABLET BY MOUTH TWO (2) TIMES DAILY (Patient taking differently: every 12 (twelve) hours as needed.) 60 tablet 6  . Hyoscyamine Sulfate SL (LEVSIN/SL) 0.125 MG SUBL Place 0.125 mg under the tongue every 6 (six) hours as needed. 120 tablet 2  . ibuprofen (ADVIL) 200 MG tablet Take 200 mg by mouth every 6 (six) hours as needed.    Marland Kitchen omeprazole (PRILOSEC OTC) 20 MG tablet Take 20 mg by mouth daily.    . brexpiprazole (REXULTI) 2 MG TABS tablet Take 1 tablet (2 mg total) by mouth daily. 90 tablet 1  . sertraline (ZOLOFT) 100 MG tablet Take 2 tablets (200 mg total) by mouth daily. 180 tablet 0   No current facility-administered medications for this visit.    Medication Side Effects: None  Allergies: No Known Allergies  Past Medical History:  Diagnosis Date  . Anxiety   . Depression   . Gallstones 2004  . IBS (irritable bowel syndrome)   . Obesity   . UTI (urinary tract infection)     Past Medical History, Surgical history, Social history, and Family history were reviewed and updated as appropriate.   Please see review of systems for further details on the patient's review from today.   Objective:   Physical Exam:  There were  no vitals taken for this visit.  Physical Exam Constitutional:      General: She is not in acute distress. Musculoskeletal:        General: No deformity.  Neurological:     Mental Status: She is alert and oriented to person, place, and time.     Coordination: Coordination normal.  Psychiatric:        Attention and Perception: Attention and perception normal. She does not perceive auditory or visual hallucinations.        Mood and Affect: Mood is anxious and depressed. Affect is not labile, blunt, angry or inappropriate.        Speech: Speech normal.         Behavior: Behavior normal.        Thought Content: Thought content normal. Thought content is not paranoid or delusional. Thought content does not include homicidal or suicidal ideation. Thought content does not include homicidal or suicidal plan.        Cognition and Memory: Cognition and memory normal.        Judgment: Judgment normal.     Comments: Insight intact     Lab Review:  No results found for: NA, K, CL, CO2, GLUCOSE, BUN, CREATININE, CALCIUM, PROT, ALBUMIN, AST, ALT, ALKPHOS, BILITOT, GFRNONAA, GFRAA  No results found for: WBC, RBC, HGB, HCT, PLT, MCV, MCH, MCHC, RDW, LYMPHSABS, MONOABS, EOSABS, BASOSABS  No results found for: POCLITH, LITHIUM   No results found for: PHENYTOIN, PHENOBARB, VALPROATE, CBMZ   .res Assessment: Plan:   Pt seen for 30 minutes and time spent discussing treatment options. Discussed increasing Sertraline to 200 mg po qd to improve depression and anxiety. Discussed considering switching Sertraline to a different medication if s/s are unimproved after dose increase.  Continue Rexulti 2 mg po qd for augmentation of depression.  Discussed potential benefits of therapy and patient agrees that therapy would likely be helpful. Therapy referrals provided.  Pt to follow-up in 1-2 months or sooner if clinically indicated.  Patient advised to contact office with any questions, adverse effects, or acute worsening in signs and symptoms.   Jacqueline Fowler was seen today for depression and anxiety.  Diagnoses and all orders for this visit:  Recurrent major depressive disorder, in full remission ([place]) -     sertraline (ZOLOFT) 100 MG tablet; Take 2 tablets (200 mg total) by mouth daily. -     brexpiprazole (REXULTI) 2 MG TABS tablet; Take 1 tablet (2 mg total) by mouth daily.  Anxiety disorder, unspecified type     Please see After Visit Summary for patient specific instructions.  Future Appointments  Date Time Provider [place]  05/10/2021 11:00 AM  Thayer Headings, PMHNP CP-CP None    No orders of the defined types were placed in this encounter.   -------------------------------

## 2021-03-29 NOTE — Telephone Encounter (Signed)
Rx cancelled and resent 

## 2021-04-06 ENCOUNTER — Other Ambulatory Visit: Payer: Self-pay | Admitting: Psychiatry

## 2021-04-06 DIAGNOSIS — F3342 Major depressive disorder, recurrent, in full remission: Secondary | ICD-10-CM

## 2021-04-16 DIAGNOSIS — F321 Major depressive disorder, single episode, moderate: Secondary | ICD-10-CM | POA: Diagnosis not present

## 2021-04-16 DIAGNOSIS — M150 Primary generalized (osteo)arthritis: Secondary | ICD-10-CM | POA: Diagnosis not present

## 2021-04-16 DIAGNOSIS — Z79899 Other long term (current) drug therapy: Secondary | ICD-10-CM | POA: Diagnosis not present

## 2021-04-16 DIAGNOSIS — M797 Fibromyalgia: Secondary | ICD-10-CM | POA: Diagnosis not present

## 2021-04-16 DIAGNOSIS — F418 Other specified anxiety disorders: Secondary | ICD-10-CM | POA: Diagnosis not present

## 2021-04-18 ENCOUNTER — Telehealth: Payer: Self-pay | Admitting: Gastroenterology

## 2021-04-18 NOTE — Telephone Encounter (Signed)
Pt states she has IBS and lately she has been having issues making it to the bathroom on time. She has had some episodes of fecal incontinence and is afraid to leave the house. Pt scheduled to see Nicoletta Ba PA tomorrow at 1:30pm. Pt aware of appt.

## 2021-04-18 NOTE — Telephone Encounter (Signed)
Patient called having a lot of issues with IBS and requested to speak with a nurse.

## 2021-04-19 ENCOUNTER — Ambulatory Visit: Payer: BC Managed Care – PPO | Admitting: Physician Assistant

## 2021-04-19 ENCOUNTER — Encounter: Payer: Self-pay | Admitting: Physician Assistant

## 2021-04-19 ENCOUNTER — Other Ambulatory Visit (INDEPENDENT_AMBULATORY_CARE_PROVIDER_SITE_OTHER): Payer: BC Managed Care – PPO

## 2021-04-19 VITALS — BP 102/70 | HR 72 | Ht 66.0 in | Wt 232.0 lb

## 2021-04-19 DIAGNOSIS — R159 Full incontinence of feces: Secondary | ICD-10-CM

## 2021-04-19 DIAGNOSIS — R197 Diarrhea, unspecified: Secondary | ICD-10-CM

## 2021-04-19 DIAGNOSIS — R109 Unspecified abdominal pain: Secondary | ICD-10-CM

## 2021-04-19 LAB — C-REACTIVE PROTEIN: CRP: 4 mg/dL (ref 0.5–20.0)

## 2021-04-19 MED ORDER — HYOSCYAMINE SULFATE ER 0.375 MG PO TB12: 0.3750 mg | ORAL_TABLET | Freq: Two times a day (BID) | ORAL | 6 refills | Status: DC

## 2021-04-19 NOTE — Patient Instructions (Signed)
If you are age 50 or younger, your body mass index should be between 19-25. Your Body mass index is 37.45 kg/m. If this is out of the aformentioned range listed, please consider follow up with your Primary Care Provider.   __________________________________________________________  The [facility] providers would like to encourage you to use [facility] to communicate with providers for non-urgent requests or questions.  Due to long hold times on the telephone, sending your provider a message by [facility] may be a faster and more efficient way to get a response.  Please allow 48 business hours for a response.  Please remember that this is for non-urgent requests.   Your provider has requested that you go to the basement level for lab work before leaving today. Press "B" on the elevator. The lab is located at the first door on the left as you exit the elevator.  You have been scheduled for a CT scan of the abdomen and pelvis at [facility] ([address]---this is in the same building as [employer]).   You are scheduled on 04/25/2021 at 2:00 pm. You should arrive 15 minutes prior to your appointment time for registration. Please follow the written instructions below on the day of your exam:  WARNING: IF YOU ARE ALLERGIC TO IODINE/X-RAY DYE, PLEASE NOTIFY RADIOLOGY IMMEDIATELY AT [phone]! YOU WILL BE GIVEN A 13 HOUR PREMEDICATION PREP.  1) Do not eat after 10:00 am (4 hours prior to your test) 2) You have been given 2 bottles of oral contrast to drink. The solution may taste better if refrigerated, but do NOT add ice or any other liquid to this solution. Shake well before drinking.    Drink 1 bottle of contrast @ 12:00 pm (2 hours prior to your exam)  Drink 1 bottle of contrast @ 1:00 pm (1 hour prior to your exam)  You may take any medications as prescribed with a small amount of water, if necessary. If you take any of the following medications: METFORMIN, GLUCOPHAGE,  GLUCOVANCE, AVANDAMET, RIOMET, FORTAMET, [place] MET, JANUMET, GLUMETZA or METAGLIP, you MAY be asked to HOLD this medication 48 hours AFTER the exam.  The purpose of you drinking the oral contrast is to aid in the visualization of your intestinal tract. The contrast solution may cause some diarrhea. Depending on your individual set of symptoms, you may also receive an intravenous injection of x-ray contrast/dye. Plan on being at [facility] for 30 minutes or longer, depending on the type of exam you are having performed.  This test typically takes 30-45 minutes to complete.  If you have any questions regarding your exam or if you need to reschedule, you may call the CT department at [phone] between the hours of 8:00 am and 5:00 pm, Monday-Friday.  ________________________________________________________________________  Rhona Raider twice daily You can take Imodium 1 tablet every morning and take up to 4 tablets a day.  Follow up pending the results of you labs and CT.  Thank you for entrusting me with your care and choosing [facility].  Amy Esterwood, PA-C

## 2021-04-19 NOTE — Progress Notes (Signed)
Subjective:    Patient ID: Jacqueline Fowler, female    DOB: 1971-08-27, 50 y.o.   MRN: [id number]  HPI Jacqueline Fowler is a pleasant 50 year old white female, established with Dr. Lyndel Safe who comes in today with complaints of severe diarrhea and episodes of fecal incontinence. She was seen in the office in February 2020 last and at that time was felt to have diagnosis of IBS-D.  At that time she was complaining of 8-10 bowel movements per day.  She is status post remote cholecystectomy in 2004 but says she did not develop diarrhea until multiple years later. She eventually underwent colonoscopy in March 2020 with finding of a 6 mm polyp in the mid ascending colon which was a sessile serrated polyp with no dysplasia.  The terminal ileum was normal-appearing and ileal biopsies were negative, random biopsies were done which were negative for microscopic colitis.  She was noted to have small internal hemorrhoids. She had been given a trial of dicyclomine but did not find that helpful.  There was discussion about Questran but I do not think she ever tried that. Today she says that she had taken Levbid in the past which she found helpful though it did not stop the diarrhea it certainly helped but says that when that was sent in a couple of years ago her insurance would not cover. Over the past 3 to 4 months she says she has had significant increase in symptoms and associated abdominal pain and cramping.  At times she has cramping is so bad that she develops diaphoresis.  She is also had some extreme urgency and had a recent episode of diarrhea while she was sleeping and did not have any warning.  She says she usually has at least 6 bowel movements each morning and then further episodes generally after eating later in the day.  She has seen occasional scant amounts of red blood primarily on the tissue.  She also had an episode recently of tenesmus type sensation without any bowel movements. She says in general she feels  poorly, has had significant fatigue and has had a lot of generalized muscle aches etc. over the past few months.  Weight overall has been stable.  She does not have any specific intolerances and says the diarrhea occurs constantly and chronically no matter what she eats. She has not had any recent antibiotics, no recent changes in meds, new supplements etc. He did have some labs done by her PCP recently in [place] and showed me these on her phone today.  She had an autoimmune panel done which appears to be negative including ANA and rheumatoid factor, TSH of 2.3 sed rate 37 WBC 5.6/hemoglobin 13 hematocrit of 39 c-Met with creatinine 0.55 AST 42 ALT of 45.  Review of Systems Pertinent positive and negative review of systems were noted in the above HPI section.  All other review of systems was otherwise negative.   Outpatient Encounter Medications as of 04/19/2021  Medication Sig   brexpiprazole (REXULTI) 2 MG TABS tablet Take 1 tablet (2 mg total) by mouth daily.   hyoscyamine (LEVBID) 0.375 MG 12 hr tablet Take 1 tablet (0.375 mg total) by mouth 2 (two) times daily.   Hyoscyamine Sulfate SL (LEVSIN/SL) 0.125 MG SUBL Place 0.125 mg under the tongue every 6 (six) hours as needed.   ibuprofen (ADVIL) 200 MG tablet Take 200 mg by mouth every 6 (six) hours as needed.   omeprazole (PRILOSEC OTC) 20 MG tablet Take 20 mg by  mouth daily.   sertraline (ZOLOFT) 100 MG tablet Take 1 tablet (100 mg total) by mouth in the morning and at bedtime.   [DISCONTINUED] hyoscyamine (LEVBID) 0.375 MG 12 hr tablet TAKE ONE (1) TABLET BY MOUTH TWO (2) TIMES DAILY (Patient taking differently: every 12 (twelve) hours as needed.)   No facility-administered encounter medications on file as of 04/19/2021.   No Known Allergies Patient Active Problem List   Diagnosis Date Noted   GAD (generalized anxiety disorder) 12/15/2018   Mood disorder ([place]) 12/15/2018   Social History   Socioeconomic History   Marital status: Married     Spouse name: Not on file   Number of children: 4   Years of education: Not on file   Highest education level: Not on file  Occupational History   Occupation: [employer]  Tobacco Use   Smoking status: Former    Pack years: 0.00   Smokeless tobacco: Never   Tobacco comments:    quit 2001 smoking  Vaping Use   Vaping Use: Never used  Substance and Sexual Activity   Alcohol use: Yes    Alcohol/week: 1.0 standard drink    Types: 1 Glasses of wine per week    Comment: ocassionally   Drug use: Never   Sexual activity: Not on file  Other Topics Concern   Not on file  Social History Narrative   Not on file   Social Determinants of Health   Financial Resource Strain: Not on file  Food Insecurity: Not on file  Transportation Needs: Not on file  Physical Activity: Not on file  Stress: Not on file  Social Connections: Not on file  Intimate Partner Violence: Not on file    Ms. Mazzarella's family history includes Depression in her son.      Objective:    Vitals:   04/19/21 1335  BP: 102/70  Pulse: 72    Physical Exam Well-developed well-nourished white female in no acute distress.  Height, Weight, 232 BMI 37.4  HEENT; nontraumatic normocephalic, EOMI, PE R LA, sclera anicteric. Oropharynx; not examined today Neck; supple, no JVD Cardiovascular; regular rate and rhythm with S1-S2, no murmur rub or gallop Pulmonary; Clear bilaterally Abdomen; soft, there is some mild rather generalized tenderness, no guarding or rebound, some more focal tenderness in the periumbilical area ,nondistended, no palpable mass or hepatosplenomegaly, bowel sounds are active Rectal; not done today Skin; benign exam, no jaundice rash or appreciable lesions Extremities; no clubbing cyanosis or edema skin warm and dry Neuro/Psych; alert and oriented x4, grossly nonfocal mood and affect appropriate        Assessment & Plan:   #24 50 year old white female with history of chronic diarrhea  with worsening symptoms over the past 3 to 4 months to the point of occasional episodes of incontinence.  Generally having 8-10 bowel movements per day.  She has also developed some generalized abdominal discomfort, frequent cramping, fatigue and myalgias  Etiology of her symptoms is not clear, she does have diagnosis of probable IBS-D with previous colonoscopy and negative random biopsies 01/2019  She is status post remote cholecystectomy 2004 but did not develop diarrhea symptoms until several years later.  Consider IBS-D with super imposed infectious etiology, rule out pancreatic insufficiency, rule out SIBO, rule out possible IBD though prior ileal biopsy negative.  #2 generalized anxiety disorder #3.  History of sessile serrated polyp, 6 mm at colonoscopy March 2020  Plan; start Levbid 1 p.o. twice daily Patient advised to start 1  Imodium every morning, then may take up to a total of 4/day Will check CRP, TTG/IgA, fecal lactoferrin, fecal elastase, GI path panel Schedule for CT of the abdomen and pelvis. If all of the above are unremarkable will need breath testing to rule out SIBO, Further recommendations pending results of above.   Brok Stocking S Shuntia Exton PA-C 04/19/2021   Cc: Bonnita Nasuti, MD

## 2021-04-20 ENCOUNTER — Telehealth: Payer: Self-pay | Admitting: Physician Assistant

## 2021-04-20 DIAGNOSIS — M797 Fibromyalgia: Secondary | ICD-10-CM | POA: Diagnosis not present

## 2021-04-20 LAB — IGA: Immunoglobulin A: 97 mg/dL (ref 47–310)

## 2021-04-20 LAB — TISSUE TRANSGLUTAMINASE, IGA: (tTG) Ab, IgA: <1 U/mL

## 2021-04-20 NOTE — Telephone Encounter (Signed)
Spoke with the patient. Explained the test takes at least 3 to 4 days to result after it is received by the main lab. She understands this will delay her treatment. Due to the high price of travel, she feels she does not have a choice.

## 2021-04-20 NOTE — Telephone Encounter (Signed)
Inbound call from pt stating that she wanted to know if she can bring her stool sample on Wednesday while she is in [facility] for her CT or does she need to bring it right away. Pt stated that she lives in [place]. Please advise. Thank you.

## 2021-04-25 ENCOUNTER — Other Ambulatory Visit: Payer: BC Managed Care – PPO

## 2021-04-25 ENCOUNTER — Other Ambulatory Visit: Payer: Self-pay

## 2021-04-25 ENCOUNTER — Ambulatory Visit (INDEPENDENT_AMBULATORY_CARE_PROVIDER_SITE_OTHER)
Admission: RE | Admit: 2021-04-25 | Discharge: 2021-04-25 | Disposition: A | Discharge status: Home / Self Care | Payer: BC Managed Care – PPO | Source: Ambulatory Visit | Attending: Physician Assistant | Admitting: Physician Assistant

## 2021-04-25 DIAGNOSIS — R159 Full incontinence of feces: Secondary | ICD-10-CM

## 2021-04-25 DIAGNOSIS — R197 Diarrhea, unspecified: Secondary | ICD-10-CM

## 2021-04-25 DIAGNOSIS — K766 Portal hypertension: Secondary | ICD-10-CM | POA: Diagnosis not present

## 2021-04-25 DIAGNOSIS — R109 Unspecified abdominal pain: Secondary | ICD-10-CM | POA: Diagnosis not present

## 2021-04-25 DIAGNOSIS — K449 Diaphragmatic hernia without obstruction or gangrene: Secondary | ICD-10-CM | POA: Diagnosis not present

## 2021-04-25 DIAGNOSIS — K7469 Other cirrhosis of liver: Secondary | ICD-10-CM | POA: Diagnosis not present

## 2021-04-25 DIAGNOSIS — N200 Calculus of kidney: Secondary | ICD-10-CM | POA: Diagnosis not present

## 2021-04-26 ENCOUNTER — Other Ambulatory Visit: Payer: Self-pay

## 2021-04-26 ENCOUNTER — Other Ambulatory Visit (INDEPENDENT_AMBULATORY_CARE_PROVIDER_SITE_OTHER): Payer: BC Managed Care – PPO

## 2021-04-26 ENCOUNTER — Telehealth: Payer: Self-pay | Admitting: Physician Assistant

## 2021-04-26 DIAGNOSIS — R933 Abnormal findings on diagnostic imaging of other parts of digestive tract: Secondary | ICD-10-CM | POA: Diagnosis not present

## 2021-04-26 DIAGNOSIS — R109 Unspecified abdominal pain: Secondary | ICD-10-CM | POA: Diagnosis not present

## 2021-04-26 LAB — FERRITIN: Ferritin: 116.4 ng/mL (ref 10.0–291.0)

## 2021-04-26 LAB — HEPATIC FUNCTION PANEL
ALT: 26 U/L (ref 0–35)
AST: 23 U/L (ref 0–37)
Albumin: 4 g/dL (ref 3.5–5.2)
Alkaline Phosphatase: 78 U/L (ref 39–117)
Bilirubin, Direct: 0.1 mg/dL (ref 0.0–0.3)
Total Bilirubin: 0.4 mg/dL (ref 0.2–1.2)
Total Protein: 6.9 g/dL (ref 6.0–8.3)

## 2021-04-26 LAB — PROTIME-INR
INR: 1.1 ratio — ABNORMAL HIGH (ref 0.8–1.0)
Prothrombin Time: 12 s (ref 9.6–13.1)

## 2021-04-26 NOTE — Telephone Encounter (Signed)
Jacqueline Fowler I reviewed the CT scan, this was done for diarrhea evaluation by Amy.  Her bowel looks okay but 2 main findings incidentally noted - she does appear to have cirrhosis of the liver based on imaging. To get to this point she has had some longstanding inflammatory process of her liver, not sure if she has been evaluated for that in the past or told she had cirrhosis or a liver problem. Her ALT was mildly elevated on recent draw. Given imaging findings she warrants blood work to rule out chronic liver diseases as outlined below. She should also minimize / stop alcohol use, not sure how much alcohol she drinks at baseline. She had fatty liver noted on prior CT imaging of her chest, not sure if that is the cause or not. There is a lot to discuss about the cirrhosis finding and long term management and she warrants a follow up clinic with Amy or Dr. Lyndel Safe to have that evaluated, we can do labs in the interim to evaluate for potential causes - she has some nonspecific lymphadenopathy noted in her abdomen, unclear what that is due to. This will warrant some follow up, likely with repeat imaging over time. We are awaiting results of her stool studies and hopefully she has submitted that - can you have her go to the lab for following serologies: LFTs, INR, hep B surface antigen, hep B surface antibody, hep C antibody, hep A total antibody, TIBC / ferritin, ceruloplasmin, alpha one antitrypsin, IgG, ANA, smooth muscle antibody - to start. Can you order these under Amy or Dr. Lyndel Safe and help coordinate follow up.  Amy, FYI, not sure if you want to call her when you get back in the office since you know her

## 2021-04-26 NOTE — Telephone Encounter (Signed)
Discussed with the patient. She is very grateful for Dr Doyne Keel attention to this. She agrees to this plan of action. She lives in [place] and asks if her follow up can be with Dr Lyndel Safe. Appointments for both Amy and Dr Lyndel Safe are on 05/29/21. Appointment arranged with Dr Lyndel Safe. She understands she will have to come to [place] office for the labs to be drawn. Again the patient says thank you to all involved. I am including Dr Lyndel Safe on the message.

## 2021-04-26 NOTE — Telephone Encounter (Signed)
Inbound call from pt requesting a call back stating she has her results from her CT but need someone to explain what it means. Please advise. Thanks

## 2021-04-26 NOTE — Telephone Encounter (Signed)
DOD Spoke with the patient. She understands the provider is out of the office until Monday.  She had a nurse friend look at the results. She is so upset, she could not sleep last night. Would you please review and let me know if this can wait for Dr Lyndel Safe and Nicoletta Ba, PA until Monday?

## 2021-04-27 DIAGNOSIS — M797 Fibromyalgia: Secondary | ICD-10-CM | POA: Diagnosis not present

## 2021-04-27 LAB — IRON AND TIBC
Iron Saturation: 15 % (ref 15–55)
Iron: 63 ug/dL (ref 27–159)
Total Iron Binding Capacity: 418 ug/dL (ref 250–450)
UIBC: 355 ug/dL (ref 131–425)

## 2021-04-29 LAB — GI PROFILE, STOOL, PCR

## 2021-04-30 ENCOUNTER — Ambulatory Visit (INDEPENDENT_AMBULATORY_CARE_PROVIDER_SITE_OTHER): Payer: BC Managed Care – PPO | Admitting: Psychology

## 2021-04-30 ENCOUNTER — Telehealth: Payer: Self-pay | Admitting: Gastroenterology

## 2021-04-30 ENCOUNTER — Telehealth: Payer: Self-pay | Admitting: Physician Assistant

## 2021-04-30 DIAGNOSIS — F331 Major depressive disorder, recurrent, moderate: Secondary | ICD-10-CM | POA: Diagnosis not present

## 2021-04-30 MED ORDER — AZITHROMYCIN 500 MG PO TABS: 500.0000 mg | ORAL_TABLET | Freq: Every day | ORAL | 0 refills | Status: DC

## 2021-04-30 MED ORDER — ONDANSETRON HCL 4 MG PO TABS: 4.0000 mg | ORAL_TABLET | Freq: Three times a day (TID) | ORAL | 0 refills | Status: DC | PRN

## 2021-04-30 NOTE — Telephone Encounter (Signed)
Inbound call from pt requesting a call back stating that she is having nausea, pain, and burning in her stomach. Please advise. Thanks

## 2021-04-30 NOTE — Telephone Encounter (Signed)
Called patient and told her about the medications that were sent and if he has any worsening problems or still have problems to go to the ED and she voiced understanding

## 2021-04-30 NOTE — Telephone Encounter (Signed)
Inbound call from patient. States she have questions about the message in [place] regarding her lab results.. Best contact number [phone]

## 2021-04-30 NOTE — Telephone Encounter (Signed)
Spoke with the patient. She feels nauseated. No vomiting. Her stomach "burns" and "feels unsettled." She has been to the bathroom to move her bowels  "6 times in 2 hours." States "it is not exactly diarrhea" and "I really just don't feel good." Denies fever, bloody stools or body aches. No URI symptoms. Stool culture + for Campylobacter. Please advise in Amy Esterwood's absence. Thanks

## 2021-04-30 NOTE — Telephone Encounter (Signed)
Stool + for Campylobacter Plan: -Azithromycin 500 mg p.o. QD x 3 days. -Keep well-hydrated -For nausea, can use Zofran 4 mg ODT Q8hrs prn #10, NR -If any worsening problems, come over to ED as she may need IV fluids and labs.   RG

## 2021-05-01 LAB — ANTI-NUCLEAR AB-TITER (ANA TITER): ANA Titer 1: 1:160 {titer} — ABNORMAL HIGH

## 2021-05-01 LAB — HEPATITIS A ANTIBODY, TOTAL: Hepatitis A AB,Total: NONREACTIVE

## 2021-05-01 LAB — ANA: Anti Nuclear Antibody (ANA): POSITIVE — AB

## 2021-05-01 LAB — IGG: IgG (Immunoglobin G), Serum: 830 mg/dL (ref 600–1640)

## 2021-05-01 LAB — CERULOPLASMIN: Ceruloplasmin: 35 mg/dL (ref 18–53)

## 2021-05-01 LAB — HEPATITIS C ANTIBODY
Hepatitis C Ab: NONREACTIVE
SIGNAL TO CUT-OFF: 0.01 (ref <1.00)

## 2021-05-01 LAB — HEPATITIS B SURFACE ANTIGEN: Hepatitis B Surface Ag: NONREACTIVE

## 2021-05-01 LAB — ALPHA-1-ANTITRYPSIN: A-1 Antitrypsin, Ser: 111 mg/dL (ref 83–199)

## 2021-05-01 LAB — ANTI-SMOOTH MUSCLE ANTIBODY, IGG: Actin (Smooth Muscle) Antibody (IGG): <20 U (ref <20)

## 2021-05-01 LAB — HEPATITIS B SURFACE ANTIBODY,QUALITATIVE: Hep B S Ab: NONREACTIVE

## 2021-05-01 NOTE — Telephone Encounter (Signed)
Questions answered.

## 2021-05-02 LAB — FECAL LACTOFERRIN, QUANT
Fecal Lactoferrin: NEGATIVE
MICRO NUMBER:: 12010857
SPECIMEN QUALITY:: ADEQUATE

## 2021-05-02 LAB — PANCREATIC ELASTASE, FECAL: Pancreatic Elastase-1, Stool: >500 mcg/g

## 2021-05-03 DIAGNOSIS — M797 Fibromyalgia: Secondary | ICD-10-CM | POA: Diagnosis not present

## 2021-05-07 ENCOUNTER — Ambulatory Visit (INDEPENDENT_AMBULATORY_CARE_PROVIDER_SITE_OTHER): Payer: BC Managed Care – PPO | Admitting: Psychology

## 2021-05-07 DIAGNOSIS — F331 Major depressive disorder, recurrent, moderate: Secondary | ICD-10-CM | POA: Diagnosis not present

## 2021-05-08 ENCOUNTER — Other Ambulatory Visit: Payer: Self-pay | Admitting: Psychiatry

## 2021-05-08 DIAGNOSIS — F3342 Major depressive disorder, recurrent, in full remission: Secondary | ICD-10-CM

## 2021-05-10 ENCOUNTER — Other Ambulatory Visit: Payer: Self-pay

## 2021-05-10 ENCOUNTER — Ambulatory Visit: Payer: BC Managed Care – PPO | Admitting: Psychiatry

## 2021-05-10 ENCOUNTER — Encounter: Payer: Self-pay | Admitting: Psychiatry

## 2021-05-10 DIAGNOSIS — F3342 Major depressive disorder, recurrent, in full remission: Secondary | ICD-10-CM

## 2021-05-10 DIAGNOSIS — F419 Anxiety disorder, unspecified: Secondary | ICD-10-CM | POA: Diagnosis not present

## 2021-05-10 DIAGNOSIS — G4700 Insomnia, unspecified: Secondary | ICD-10-CM | POA: Diagnosis not present

## 2021-05-10 MED ORDER — BUSPIRONE HCL 15 MG PO TABS: ORAL_TABLET | ORAL | 2 refills | Status: DC

## 2021-05-10 MED ORDER — SERTRALINE HCL 100 MG PO TABS
100.0000 mg | ORAL_TABLET | Freq: Two times a day (BID) | ORAL | 0 refills | Status: DC
Start: 2021-05-10 — End: 2021-07-31

## 2021-05-10 MED ORDER — TRAZODONE HCL 100 MG PO TABS: ORAL_TABLET | ORAL | 2 refills | Status: DC

## 2021-05-10 NOTE — Progress Notes (Signed)
Jacqueline Fowler [id number] 07/13/1971 50 y.o.  Subjective:   Patient ID:  Jacqueline Fowler is a 50 y.o. (DOB February 28, 1971) female.  Chief Complaint:  Chief Complaint  Patient presents with   Anxiety   Depression   Insomnia     HPI Jacqueline Fowler presents to the office today for follow-up of anxiety, depression, and insomnia. Reports that she was dx'd with cirrhosis and does not see specialist until 05/29/21. She saw her results in MyChart and reports that she looked up things online and this caused increased anxiety. She had hepatitis vaccine. She has joined some online support group for cirrhosis.   She report that depression seems to be improved with increase in Sertraline. She reports that she is not sleeping well due to anxiety about health. She reports feeling overwhelmed. She reports, "I haven't been depressed, just down" about dx of cirrhosis. She reports difficulty falling and staying asleep due to rumination. She reports fatigue. Appetite has been ok. She reports that her motivation is low. Denies diminished interested and enjoyment in things. She reports difficulty with concentration. She reports that she frequently has to ask husband to repeat himself. Denies SI.   She reports that June is typically a difficult month for her work.   She has seen Caroline Sauger, LCSW for therapy. She reports that they plan to start EMDR.   Past Psychiatric Medication Trials: Valium Sertraline Prozac Wellbutrin-adverse reaction Rexulti Abilify- Weight gain Trazodone Buspar   [facility] [facility] Visit from 03/29/2021 in [facility]  AIMS Total Score 0        Review of Systems:  Review of Systems  Constitutional:  Positive for fatigue.  Gastrointestinal:  Positive for abdominal pain and nausea.  Musculoskeletal:  Negative for gait problem.  Neurological:  Positive for headaches. Negative for tremors.  Psychiatric/Behavioral:         Please refer to HPI    Medications: I have reviewed the patient's current medications.  Current Outpatient Medications  Medication Sig Dispense Refill   brexpiprazole (REXULTI) 2 MG TABS tablet Take 1 tablet (2 mg total) by mouth daily. 90 tablet 1   busPIRone (BUSPAR) 15 MG tablet Take 1/3 tablet p.o. twice daily for 1 week, then take 2/3 tablet p.o. twice daily for 1 week, then take 1 tablet p.o. twice daily 60 tablet 2   hyoscyamine (LEVBID) 0.375 MG 12 hr tablet Take 1 tablet (0.375 mg total) by mouth 2 (two) times daily. 60 tablet 6   ibuprofen (ADVIL) 200 MG tablet Take 200 mg by mouth every 6 (six) hours as needed.     omeprazole (PRILOSEC OTC) 20 MG tablet Take 20 mg by mouth daily.     ondansetron (ZOFRAN) 4 MG tablet Take 1 tablet (4 mg total) by mouth every 8 (eight) hours as needed for nausea or vomiting. 10 tablet 0   oxyCODONE-acetaminophen (PERCOCET/ROXICET) 5-325 MG tablet Take 1 tablet by mouth 2 (two) times daily as needed.     traZODone (DESYREL) 100 MG tablet Take 1/2-1 tablet po QHS prn insomnia 30 tablet 2   azithromycin (ZITHROMAX) 500 MG tablet Take 1 tablet (500 mg total) by mouth daily. (Patient not taking: Reported on 05/10/2021) 3 tablet 0   Hyoscyamine Sulfate SL (LEVSIN/SL) 0.125 MG SUBL Place 0.125 mg under the tongue every 6 (six) hours as needed. 120 tablet 2   sertraline (ZOLOFT) 100 MG tablet Take 1 tablet (100 mg total) by mouth in the morning and  at bedtime. 180 tablet 0   No current facility-administered medications for this visit.    Medication Side Effects: None  Allergies: No Known Allergies  Past Medical History:  Diagnosis Date   Anxiety    Depression    Gallstones 2004   IBS (irritable bowel syndrome)    Obesity    UTI (urinary tract infection)     Past Medical History, Surgical history, Social history, and Family history were reviewed and updated as appropriate.   Please see review of systems for further details on the patient's review from today.    Objective:   Physical Exam:  There were no vitals taken for this visit.  Physical Exam Constitutional:      General: She is not in acute distress. Musculoskeletal:        General: No deformity.  Neurological:     Mental Status: She is alert and oriented to person, place, and time.     Coordination: Coordination normal.  Psychiatric:        Attention and Perception: Attention and perception normal. She does not perceive auditory or visual hallucinations.        Mood and Affect: Mood is anxious. Affect is not labile, blunt, angry or inappropriate.        Speech: Speech normal.        Behavior: Behavior normal.        Thought Content: Thought content normal. Thought content is not paranoid or delusional. Thought content does not include homicidal or suicidal ideation. Thought content does not include homicidal or suicidal plan.        Cognition and Memory: Cognition and memory normal.        Judgment: Judgment normal.     Comments: Insight intact Mood presents as less depressed    Lab Review:     Component Value Date/Time   PROT 6.9 04/26/2021 1354   ALBUMIN 4.0 04/26/2021 1354   AST 23 04/26/2021 1354   ALT 26 04/26/2021 1354   ALKPHOS 78 04/26/2021 1354   BILITOT 0.4 04/26/2021 1354    No results found for: WBC, RBC, HGB, HCT, PLT, MCV, MCH, MCHC, RDW, LYMPHSABS, MONOABS, EOSABS, BASOSABS  No results found for: POCLITH, LITHIUM   No results found for: PHENYTOIN, PHENOBARB, VALPROATE, CBMZ   .res Assessment: Plan:    Patient seen for 30 minutes and time spent discussing her recent anxiety and mood changes in response to cirrhosis diagnosis.  Discussed that sertraline is typically not contraindicated in liver disease.  Discussed treatment options for acute insomnia that has started since dealing with recent health issues.  Discussed potential benefits, risks, and side effects of trazodone.  Patient agrees to trial of trazodone.  Will start trazodone 100 mg 1/2 to 1  tablet at bedtime as needed for insomnia. Discussed option to restart BuSpar since patient reports that this was well tolerated for anxiety in the past and seemed to be helpful.  Start BuSpar 15 mg 1/3 tablet twice daily for 1 week, then increase to 2/3 tablet twice daily for 1 week, then increase to 1 tablet twice daily for anxiety. Continue sertraline 100 mg twice daily for mood and anxiety signs and symptoms. Continue Rexulti 2 mg daily for depression and anxiety. Patient advised to contact office with any questions, adverse effects, or acute worsening in signs and symptoms.   Tal was seen today for anxiety, depression and insomnia.  Diagnoses and all orders for this visit:  Anxiety disorder, unspecified type -  sertraline (ZOLOFT) 100 MG tablet; Take 1 tablet (100 mg total) by mouth in the morning and at bedtime. -     busPIRone (BUSPAR) 15 MG tablet; Take 1/3 tablet p.o. twice daily for 1 week, then take 2/3 tablet p.o. twice daily for 1 week, then take 1 tablet p.o. twice daily  Recurrent major depressive disorder, in full remission (HCC) -     sertraline (ZOLOFT) 100 MG tablet; Take 1 tablet (100 mg total) by mouth in the morning and at bedtime.  Insomnia, unspecified type -     traZODone (DESYREL) 100 MG tablet; Take 1/2-1 tablet po QHS prn insomnia    Please see After Visit Summary for patient specific instructions.  Future Appointments  Date Time Provider [place]  05/15/2021  1:00 PM Cottle, Lucious Groves, LCSW [facility] None  05/21/2021  2:00 PM Cottle, Bambi G, LCSW [facility] None  05/28/2021  2:00 PM Cottle, Bambi G, LCSW [facility] None  05/29/2021  3:00 PM [name], MD [facility]  06/04/2021  2:00 PM Cottle, Lucious Groves, LCSW [facility] None  08/10/2021 10:30 AM Thayer Headings, PMHNP [facility] None    No orders of the defined types were placed in this encounter.   -------------------------------

## 2021-05-11 NOTE — Telephone Encounter (Signed)
If you have any cancellation prior, can work her in. [place]

## 2021-05-11 NOTE — Progress Notes (Signed)
Agree with plan RG 

## 2021-05-15 ENCOUNTER — Ambulatory Visit (INDEPENDENT_AMBULATORY_CARE_PROVIDER_SITE_OTHER): Payer: BC Managed Care – PPO | Admitting: Psychology

## 2021-05-15 DIAGNOSIS — F331 Major depressive disorder, recurrent, moderate: Secondary | ICD-10-CM | POA: Diagnosis not present

## 2021-05-15 NOTE — Telephone Encounter (Signed)
No cancellations currently but will put you on a list for now

## 2021-05-21 ENCOUNTER — Ambulatory Visit (INDEPENDENT_AMBULATORY_CARE_PROVIDER_SITE_OTHER): Payer: BC Managed Care – PPO | Admitting: Psychology

## 2021-05-21 ENCOUNTER — Encounter: Payer: Self-pay | Admitting: Gastroenterology

## 2021-05-21 ENCOUNTER — Ambulatory Visit (INDEPENDENT_AMBULATORY_CARE_PROVIDER_SITE_OTHER): Payer: BC Managed Care – PPO | Admitting: Gastroenterology

## 2021-05-21 ENCOUNTER — Other Ambulatory Visit: Payer: Self-pay

## 2021-05-21 VITALS — BP 122/78 | HR 78 | Ht 66.0 in | Wt 236.0 lb

## 2021-05-21 DIAGNOSIS — K219 Gastro-esophageal reflux disease without esophagitis: Secondary | ICD-10-CM

## 2021-05-21 DIAGNOSIS — K222 Esophageal obstruction: Secondary | ICD-10-CM | POA: Diagnosis not present

## 2021-05-21 DIAGNOSIS — F331 Major depressive disorder, recurrent, moderate: Secondary | ICD-10-CM | POA: Diagnosis not present

## 2021-05-21 DIAGNOSIS — K449 Diaphragmatic hernia without obstruction or gangrene: Secondary | ICD-10-CM | POA: Diagnosis not present

## 2021-05-21 DIAGNOSIS — K7460 Unspecified cirrhosis of liver: Secondary | ICD-10-CM | POA: Diagnosis not present

## 2021-05-21 NOTE — Progress Notes (Signed)
Chief Complaint: FU  Referring Provider:  Bonnita Nasuti, MD      ASSESSMENT AND PLAN;   #1. Liver cirrhosis on CT 04/2021 with mild splenomegaly, likely d/t NAFLD. No ETOH.   #2. GERD with small HH and eso stricture s/p dil 2019. Now with intermittent dysphagia.  #3. IBS-D. Assoc Post-chole diarrhea, neg colon with Bx 01/2019. Stool studies + Campylobacter.  Treated with azithromycin  #4. H/O tubular adenoma 01/2019. Rpt in 5 yrs.  #5. Incidental mild mesenteric lymphadenopathy (likely reactive per radiology).  Plan: -EGD with dil -Continue omeprazole 20mg  po QD -Continue wt loss.  Watch calorie intake.  Avoid hepatotoxic meds. -CBC, CMP, AMA, AFP @ time of EGD. -[place] Q6 monthly-yearly. -Would recommend CT Abdo/pelvis in 1 year for FU of mild mesenteric adenopathy. -She has appt with Dr Earnest Conroy (rheumatology) for positive ANA. -I have reassured the patient and gone over CT report in great detail with patient and patient's husband.   Proceed with EGD with dil. I have discussed the risks and benefits. The risks including rare risk of perforation, bleeding, missed UGI neoplasms, risks of anesthesia/sedation. Alternatives were given. Patient is aware and agrees to proceed. All the questions were answered. This will be scheduled in upcoming days. Consent forms were given for review.    Addendum_ -Change recall to 5 years (01/2024) -Cancel ASMA HPI:    Jacqueline Fowler is a 50 y.o. female  For follow-up visit.  Here to discuss results of CT Abdo/pelvis.  Has diarrhea at baseline 2-3/day, after eating, without any nocturnal symptoms.  No nausea, vomiting, heartburn, regurgitation, odynophagia or dysphagia.  No significant constipation.  No melena or hematochezia. No abdominal pain.  No H/O itching, skin lesions, easy bruisability, intake of OTC meds including diet pills, herbal medications, anabolic steroids or Tylenol. There is no H/O blood transfusions, IVDA or FH of liver disease.  No jaundice, dark urine or pale stools. No alcohol abuse.  She has intentionally reduced weight as detailed below, by watching p.o. intake and exercising.  Wt Readings from Last 3 Encounters:  05/21/21 236 lb (107 kg)  04/19/21 232 lb (105.2 kg)  01/13/19 (!) 306 lb (138.8 kg)   Past GI work-up:  CT AP with contrast 04/25/2021 1. Cirrhotic hepatic morphology with evidence of portal hypertension including splenomegaly. 2. Enlarged upper abdominal lymph nodes, nonspecific and in the setting of hepatocellular disease are possibly reactive. 3. Punctate nonobstructive left renal stone. 4. Small hiatal hernia. Enteric contrast within the distal esophagus may reflect gastroesophageal reflux or esophageal dysmotility. 5. Aortic Atherosclerosis (ICD10-I70.0).  Colonoscopy 01/2019 -Colonic polyp s/p polypectomy. Bx- SSA. Rpt in 5 yrs -Small internal hemorrhoids. -Otherwise normal colonoscopy to TI. -Neg random colon biopsies for microscopic colitis. -Neg random TI biopsies.  WU for liver disease 04/2021 -Nl AFP, ceruloplasmin. Neg acute hepatitis profile.  Not immune to hepatitis A/B.  Got first vaccine. -Iron saturation 15% -LFTs: Alb 4.0, AST 23, ALT 26, TB 0.4 -PT INR 1.1 -ANA +ve 1:160 -Neg celiac screen -ASMA <20 -IgG Nl  CT 04/2009 -Fatty liver Past Medical History:  Diagnosis Date   Anxiety    Depression    Gallstones 2004   IBS (irritable bowel syndrome)    Obesity    UTI (urinary tract infection)     Past Surgical History:  Procedure Laterality Date   BREAST BIOPSY Right    age 65 or 77 cyst that was removed   CHOLECYSTECTOMY  2004   COLONOSCOPY  08/07/2009  Small internal hemorrhoids. Otherwise normal colonoscopy to TI.    ESOPHAGOGASTRODUODENOSCOPY  05/31/2008   Esophageal stricture status post dilation. Mild gastritis.    esophagus stretching     IUD REMOVAL     tummy tuck  03/20/2020   UPPER GASTROINTESTINAL ENDOSCOPY      Family History  Problem  Relation Age of Onset   Depression Son    Colon cancer Neg Hx    Esophageal cancer Neg Hx    Rectal cancer Neg Hx    Stomach cancer Neg Hx     Social History   Tobacco Use   Smoking status: Former    Pack years: 0.00   Smokeless tobacco: Never   Tobacco comments:    quit 2001 smoking  Vaping Use   Vaping Use: Never used  Substance Use Topics   Alcohol use: Not Currently    Alcohol/week: 1.0 standard drink    Types: 1 Glasses of wine per week    Comment: ocassionally   Drug use: Never    Current Outpatient Medications  Medication Sig Dispense Refill   brexpiprazole (REXULTI) 2 MG TABS tablet Take 1 tablet (2 mg total) by mouth daily. 90 tablet 1   busPIRone (BUSPAR) 15 MG tablet Take 1/3 tablet p.o. twice daily for 1 week, then take 2/3 tablet p.o. twice daily for 1 week, then take 1 tablet p.o. twice daily 60 tablet 2   hyoscyamine (LEVBID) 0.375 MG 12 hr tablet Take 1 tablet (0.375 mg total) by mouth 2 (two) times daily. 60 tablet 6   omeprazole (PRILOSEC OTC) 20 MG tablet Take 20 mg by mouth daily.     ondansetron (ZOFRAN) 4 MG tablet Take 1 tablet (4 mg total) by mouth every 8 (eight) hours as needed for nausea or vomiting. 10 tablet 0   oxyCODONE-acetaminophen (PERCOCET/ROXICET) 5-325 MG tablet Take 1 tablet by mouth 2 (two) times daily as needed.     sertraline (ZOLOFT) 100 MG tablet Take 1 tablet (100 mg total) by mouth in the morning and at bedtime. 180 tablet 0   traZODone (DESYREL) 100 MG tablet Take 1/2-1 tablet po QHS prn insomnia 30 tablet 2   azithromycin (ZITHROMAX) 500 MG tablet Take 1 tablet (500 mg total) by mouth daily. (Patient not taking: No sig reported) 3 tablet 0   No current facility-administered medications for this visit.    No Known Allergies  Review of Systems:  neg     Physical Exam:    BP 122/78   Pulse 78   Ht 5\' 6"  (1.676 m)   Wt 236 lb (107 kg)   BMI 38.09 kg/m  Wt Readings from Last 3 Encounters:  05/21/21 236 lb (107 kg)   04/19/21 232 lb (105.2 kg)  01/13/19 (!) 306 lb (138.8 kg)   Constitutional:  Well-developed, in no acute distress. Psychiatric: Normal mood and affect. Behavior is normal. HEENT: Pupils normal.  Conjunctivae are normal. No scleral icterus. Cardiovascular: Normal rate, regular rhythm. No edema Pulmonary/chest: Effort normal and breath sounds normal. No wheezing, rales or rhonchi. Abdominal: Soft, nondistended. Nontender. Bowel sounds active throughout. There are no masses palpable. No hepatomegaly. Rectal: Deferred Neurological: Alert and oriented to person place and time. Skin: Skin is warm and dry. No rashes noted.  Data Reviewed: I have personally reviewed following labs and imaging studies  CBC: No flowsheet data found.  CMP: CMP Latest Ref Rng & Units 04/26/2021  Total Protein 6.0 - 8.3 g/dL 6.9  Total Bilirubin 0.2 - 1.2  mg/dL 0.4  Alkaline Phos 39 - 117 U/L 78  AST 0 - 37 U/L 23  ALT 0 - 35 U/L 26     Radiology Studies: CT ABDOMEN PELVIS WO CONTRAST  Result Date: 04/25/2021 CLINICAL DATA:  Nonlocalized abdominal pain. EXAM: CT ABDOMEN AND PELVIS WITHOUT CONTRAST TECHNIQUE: Multidetector CT imaging of the abdomen and pelvis was performed following the standard protocol without IV contrast. COMPARISON:  CT April 13, 2009. FINDINGS: Lower chest: No acute abnormality. Small hiatal hernia. Enteric contrast visualized within the distal esophagus. Hepatobiliary: Widening of the hepatic fissures with nodular hepatic contour and hypertrophy of the lateral segment left lobe of the liver. Hypodense 4 mm lesion right lobe of the liver on image 8/2, technically too small to accurately characterize but favored represent a cyst. Gallbladder surgically absent. No suspicious biliary ductal dilation. Pancreas: Within normal limits. Spleen: Mild splenomegaly measuring 14 cm in maximum craniocaudal dimension. Adrenals/Urinary Tract: Bilateral adrenal glands are unremarkable. Punctate nonobstructive  left lower pole renal stone. No hydronephrosis. No contour deforming renal masses. Urinary bladder is grossly unremarkable. Stomach/Bowel: Radiopaque enteric contrast traverses the terminal ileum. Small hiatal hernia otherwise the stomach is grossly unremarkable. Normal positioning of the duodenum/ligament of [place]itz. No pathologic dilation of small bowel. Normal appendix. Moderate volume of formed stool throughout the colon. No suspicious colonic wall thickening or mass like lesions. Vascular/Lymphatic: Aortic atherosclerosis without aneurysmal dilation. Enlarged upper abdominal lymph nodes, for instance a 1.3 cm periportal lymph node on image 25/2 and a 1.1 cm pericaval lymph node on image 21/2. Delete that Reproductive: Uterus and bilateral adnexa are unremarkable. Other: No abdominopelvic ascites. Musculoskeletal: No acute osseous abnormality. IMPRESSION: 1. Cirrhotic hepatic morphology with evidence of portal hypertension including splenomegaly. 2. Enlarged upper abdominal lymph nodes, nonspecific and in the setting of hepatocellular disease are possibly reactive. 3. Punctate nonobstructive left renal stone. 4. Small hiatal hernia. Enteric contrast within the distal esophagus may reflect gastroesophageal reflux or esophageal dysmotility. 5. Aortic Atherosclerosis (ICD10-I70.0). Electronically Signed   By: Dahlia Bailiff MD   On: 04/25/2021 16:56      Carmell Austria, MD 05/21/2021, 4:02 PM  Cc: Bonnita Nasuti, MD

## 2021-05-21 NOTE — Patient Instructions (Addendum)
If you are age 50 or older, your body mass index should be between 23-30. Your Body mass index is 38.09 kg/m. If this is out of the aforementioned range listed, please consider follow up with your Primary Care Provider.  If you are age 68 or younger, your body mass index should be between 19-25. Your Body mass index is 38.09 kg/m. If this is out of the aformentioned range listed, please consider follow up with your Primary Care Provider.   __________________________________________________________  The [facility] providers would like to encourage you to use [facility] to communicate with providers for non-urgent requests or questions.  Due to long hold times on the telephone, sending your provider a message by [facility] may be a faster and more efficient way to get a response.  Please allow 48 business hours for a response.  Please remember that this is for non-urgent requests.   Continue omeprazole  Please go to the [facility] lab the day of your procedure about 20-30 minutes before the arrival time to get some lab work done and so you wont be late for your appointment please.  Please call with any questions or concerns.  Thank you,  Dr. [name]

## 2021-05-28 ENCOUNTER — Ambulatory Visit (INDEPENDENT_AMBULATORY_CARE_PROVIDER_SITE_OTHER): Payer: BC Managed Care – PPO | Admitting: Psychology

## 2021-05-28 DIAGNOSIS — F331 Major depressive disorder, recurrent, moderate: Secondary | ICD-10-CM | POA: Diagnosis not present

## 2021-05-29 ENCOUNTER — Ambulatory Visit: Payer: BC Managed Care – PPO | Admitting: Gastroenterology

## 2021-05-30 ENCOUNTER — Encounter: Payer: Self-pay | Admitting: Gastroenterology

## 2021-06-02 NOTE — Telephone Encounter (Signed)
Fatigue is a difficult subject Multifactorial-many etiologies including liver cirrhosis.  Let us see what Dr Earnest Conroy has to say (+ANA) Can definitely try milk thistle.  No side effects.  Worth a try. RG

## 2021-06-04 ENCOUNTER — Ambulatory Visit: Payer: BC Managed Care – PPO | Admitting: Psychology

## 2021-06-11 ENCOUNTER — Other Ambulatory Visit: Payer: Self-pay | Admitting: Psychiatry

## 2021-06-11 ENCOUNTER — Ambulatory Visit (INDEPENDENT_AMBULATORY_CARE_PROVIDER_SITE_OTHER): Payer: BC Managed Care – PPO | Admitting: Psychology

## 2021-06-11 DIAGNOSIS — F3342 Major depressive disorder, recurrent, in full remission: Secondary | ICD-10-CM

## 2021-06-11 DIAGNOSIS — F331 Major depressive disorder, recurrent, moderate: Secondary | ICD-10-CM | POA: Diagnosis not present

## 2021-06-12 ENCOUNTER — Telehealth: Payer: Self-pay | Admitting: Psychiatry

## 2021-06-12 ENCOUNTER — Other Ambulatory Visit: Payer: Self-pay

## 2021-06-12 DIAGNOSIS — F3342 Major depressive disorder, recurrent, in full remission: Secondary | ICD-10-CM

## 2021-06-12 MED ORDER — BREXPIPRAZOLE 2 MG PO TABS: 2.0000 mg | ORAL_TABLET | Freq: Every day | ORAL | 1 refills | Status: DC

## 2021-06-12 NOTE — Telephone Encounter (Signed)
Rx sent 

## 2021-06-12 NOTE — Telephone Encounter (Signed)
Pt called and said that the rexullti script that was sent in may was sent to the wrong phramacy. Please cancel and re [facility] [address] in [place]

## 2021-06-13 ENCOUNTER — Other Ambulatory Visit: Payer: Self-pay

## 2021-06-13 ENCOUNTER — Other Ambulatory Visit (INDEPENDENT_AMBULATORY_CARE_PROVIDER_SITE_OTHER): Payer: BC Managed Care – PPO

## 2021-06-13 ENCOUNTER — Encounter: Payer: Self-pay | Admitting: Gastroenterology

## 2021-06-13 ENCOUNTER — Telehealth: Payer: Self-pay

## 2021-06-13 ENCOUNTER — Ambulatory Visit (AMBULATORY_SURGERY_CENTER): Payer: BC Managed Care – PPO | Admitting: Gastroenterology

## 2021-06-13 VITALS — BP 121/72 | HR 75 | Temp 97.1°F | Resp 15 | Ht 66.0 in | Wt 236.0 lb

## 2021-06-13 DIAGNOSIS — R1310 Dysphagia, unspecified: Secondary | ICD-10-CM | POA: Diagnosis not present

## 2021-06-13 DIAGNOSIS — K449 Diaphragmatic hernia without obstruction or gangrene: Secondary | ICD-10-CM | POA: Diagnosis not present

## 2021-06-13 DIAGNOSIS — K2970 Gastritis, unspecified, without bleeding: Secondary | ICD-10-CM

## 2021-06-13 DIAGNOSIS — K7460 Unspecified cirrhosis of liver: Secondary | ICD-10-CM

## 2021-06-13 DIAGNOSIS — K222 Esophageal obstruction: Secondary | ICD-10-CM

## 2021-06-13 DIAGNOSIS — K219 Gastro-esophageal reflux disease without esophagitis: Secondary | ICD-10-CM

## 2021-06-13 DIAGNOSIS — K319 Disease of stomach and duodenum, unspecified: Secondary | ICD-10-CM | POA: Diagnosis not present

## 2021-06-13 LAB — COMPREHENSIVE METABOLIC PANEL
ALT: 21 U/L (ref 0–35)
AST: 23 U/L (ref 0–37)
Albumin: 4.4 g/dL (ref 3.5–5.2)
Alkaline Phosphatase: 52 U/L (ref 39–117)
BUN: 20 mg/dL (ref 6–23)
CO2: 22 mEq/L (ref 19–32)
Calcium: 9.7 mg/dL (ref 8.4–10.5)
Chloride: 103 mEq/L (ref 96–112)
Creatinine, Ser: 0.83 mg/dL (ref 0.40–1.20)
GFR: 82.47 mL/min (ref >60.00)
Glucose, Bld: 95 mg/dL (ref 70–99)
Potassium: 4.4 mEq/L (ref 3.5–5.1)
Sodium: 137 mEq/L (ref 135–145)
Total Bilirubin: 0.6 mg/dL (ref 0.2–1.2)
Total Protein: 7.3 g/dL (ref 6.0–8.3)

## 2021-06-13 LAB — CBC WITH DIFFERENTIAL/PLATELET
Basophils Absolute: 0 10*3/uL (ref 0.0–0.1)
Basophils Relative: 0.4 % (ref 0.0–3.0)
Eosinophils Absolute: 0 10*3/uL (ref 0.0–0.7)
Eosinophils Relative: 0 % (ref 0.0–5.0)
HCT: 43.9 % (ref 36.0–46.0)
Hemoglobin: 14.7 g/dL (ref 12.0–15.0)
Lymphocytes Relative: 22.8 % (ref 12.0–46.0)
Lymphs Abs: 1.1 10*3/uL (ref 0.7–4.0)
MCHC: 33.5 g/dL (ref 30.0–36.0)
MCV: 83.6 fl (ref 78.0–100.0)
Monocytes Absolute: 0.3 10*3/uL (ref 0.1–1.0)
Monocytes Relative: 6.9 % (ref 3.0–12.0)
Neutro Abs: 3.4 10*3/uL (ref 1.4–7.7)
Neutrophils Relative %: 69.9 % (ref 43.0–77.0)
Platelets: 194 10*3/uL (ref 150.0–400.0)
RBC: 5.25 Mil/uL — ABNORMAL HIGH (ref 3.87–5.11)
RDW: 14.5 % (ref 11.5–15.5)
WBC: 4.9 10*3/uL (ref 4.0–10.5)

## 2021-06-13 MED ORDER — PANTOPRAZOLE SODIUM 40 MG PO TBEC: 40.0000 mg | DELAYED_RELEASE_TABLET | Freq: Every day | ORAL | 11 refills | Status: DC

## 2021-06-13 MED ORDER — PANTOPRAZOLE SODIUM 20 MG PO TBEC: 40.0000 mg | DELAYED_RELEASE_TABLET | Freq: Every day | ORAL | 11 refills | Status: DC

## 2021-06-13 MED ORDER — SODIUM CHLORIDE 0.9 % IV SOLN
500.0000 mL | Freq: Once | INTRAVENOUS | Status: AC
Start: 2021-06-13 — End: ?

## 2021-06-13 NOTE — Telephone Encounter (Signed)
Changed Protonix 20 mg Twice daily to Protonix 40 mg once daily. I Spoke with Lanier Prude, RN, [facility]

## 2021-06-13 NOTE — Patient Instructions (Signed)
Handout given:  post dilation diet, stricture Start dilation diet today upon leaving Start protonix '40mg'$  by mouth one daily . Take with water only and at least 30 min before eating Await pathology results  YOU HAD AN ENDOSCOPIC PROCEDURE TODAY AT [place]:   Refer to the procedure report that was given to you for any specific questions about what was found during the examination.  If the procedure report does not answer your questions, please call your gastroenterologist to clarify.  If you requested that your care partner not be given the details of your procedure findings, then the procedure report has been included in a sealed envelope for you to review at your convenience later.  YOU SHOULD EXPECT: Some feelings of bloating in the abdomen. Passage of more gas than usual.  Walking can help get rid of the air that was put into your GI tract during the procedure and reduce the bloating. If you had a lower endoscopy (such as a colonoscopy or flexible sigmoidoscopy) you may notice spotting of blood in your stool or on the toilet paper. If you underwent a bowel prep for your procedure, you may not have a normal bowel movement for a few days.  Please Note:  You might notice some irritation and congestion in your nose or some drainage.  This is from the oxygen used during your procedure.  There is no need for concern and it should clear up in a day or so.  SYMPTOMS TO REPORT IMMEDIATELY:  Following upper endoscopy (EGD)  Vomiting of blood or coffee ground material  New chest pain or pain under the shoulder blades  Painful or persistently difficult swallowing  New shortness of breath  Fever of 100F or higher  Black, tarry-looking stools  For urgent or emergent issues, a gastroenterologist can be reached at any hour by calling [phone]. Do not use MyChart messaging for urgent concerns.    DIET:  We do recommend a small meal at first, but then you may proceed to your  regular diet.  Drink plenty of fluids but you should avoid alcoholic beverages for 24 hours.  ACTIVITY:  You should plan to take it easy for the rest of today and you should NOT DRIVE or use heavy machinery until tomorrow (because of the sedation medicines used during the test).    FOLLOW UP: Our staff will call the number listed on your records 48-72 hours following your procedure to check on you and address any questions or concerns that you may have regarding the information given to you following your procedure. If we do not reach you, we will leave a message.  We will attempt to reach you two times.  During this call, we will ask if you have developed any symptoms of COVID 19. If you develop any symptoms (ie: fever, flu-like symptoms, shortness of breath, cough etc.) before then, please call [phone].  If you test positive for Covid 19 in the 2 weeks post procedure, please call and report this information to [place].    If any biopsies were taken you will be contacted by phone or by letter within the next 1-3 weeks.  Please call us at [phone] if you have not heard about the biopsies in 3 weeks.    SIGNATURES/CONFIDENTIALITY: You and/or your care partner have signed paperwork which will be entered into your electronic medical record.  These signatures attest to the fact that that the information above on your After Visit Summary has  been reviewed and is understood.  Full responsibility of the confidentiality of this discharge information lies with you and/or your care-partner.  

## 2021-06-13 NOTE — Op Note (Addendum)
[place] Patient Name: Jacqueline Fowler Procedure Date: 06/13/2021 9:23 AM MRN: [id number] Endoscopist: [name] , MD Age: 50 Referring MD:  Date of Birth: [birth year] Gender: Female Account #: [id number] Procedure:                Upper GI endoscopy Indications:              Dysphagia. H/O NASH cirrhosis. Medicines:                Monitored Anesthesia Care Procedure:                Pre-Anesthesia Assessment:                           - Prior to the procedure, a History and Physical                            was performed, and patient medications and                            allergies were reviewed. The patient's tolerance of                            previous anesthesia was also reviewed. The risks                            and benefits of the procedure and the sedation                            options and risks were discussed with the patient.                            All questions were answered, and informed consent                            was obtained. Prior Anticoagulants: The patient has                            taken no previous anticoagulant or antiplatelet                            agents. ASA Grade Assessment: II - A patient with                            mild systemic disease. After reviewing the risks                            and benefits, the patient was deemed in                            satisfactory condition to undergo the procedure.                           After obtaining informed consent, the endoscope was  passed under direct vision. Throughout the                            procedure, the patient's blood pressure, pulse, and                            oxygen saturations were monitored continuously. The                            GIF [id number] was introduced through the                            mouth, and advanced to the second part of duodenum.                            The upper GI endoscopy was  accomplished without                            difficulty. The patient tolerated the procedure                            well. Scope In: Scope Out: Findings:                 One benign-appearing, intrinsic moderate                            (circumferential scarring or stenosis; an endoscope                            may pass) stenosis was found 35 cm from the                            incisors. This stenosis measured 1.2 cm (inner                            diameter). The stenosis was traversed. The scope                            was withdrawn. Dilation was performed with a                            Maloney dilator with mild resistance at 50 Fr and                            52 Fr. Biopsies were performed from proximal mid                            and distal esophagus to rule out EoE. No esophageal                            varices.                           A small hiatal hernia  was noted. Scattered mild                            inflammation characterized by erythema was found in                            the gastric body and in the gastric antrum.                            Biopsies were taken with a cold forceps for                            histology. No fundal varices.                           The examined duodenum was normal. Biopsies for                            histology were taken with a cold forceps for                            evaluation of celiac disease. Complications:            No immediate complications. Estimated Blood Loss:     Estimated blood loss: none. Impression:               - Benign-appearing esophageal stenosis. Dilated.                           - Gastritis. Biopsied.                           - Normal examined duodenum. Biopsied. Recommendation:           - Patient has a contact number available for                            emergencies. The signs and symptoms of potential                            delayed complications were discussed  with the                            patient. Return to normal activities tomorrow.                            Written discharge instructions were provided to the                            patient.                           - Post dilatation diet.                           - Change omeprazole to Protonix 40 mg p.o. once a  day, #30, 11 refills                           - Await pathology results.                           - The findings and recommendations were discussed                            with the patient's family. [name], MD 06/13/2021 9:50:17 AM This report has been signed electronically.

## 2021-06-13 NOTE — Progress Notes (Signed)
Called to room to assist during endoscopic procedure.  Patient ID and intended procedure confirmed with present staff. Received instructions for my participation in the procedure from the performing physician.  

## 2021-06-13 NOTE — Telephone Encounter (Signed)
PA  for Protonix 40 MG Approved Effective from 06/13/2021 through 06/12/2022.

## 2021-06-13 NOTE — Progress Notes (Signed)
VS- Donna Thomas. °

## 2021-06-13 NOTE — Progress Notes (Signed)
Pt in recovery with monitors in place, VSS. Report given to receiving RN. Bite guard was placed with pt awake to ensure comfort. No dental or soft tissue damage noted. RN will remove the guard when the pt is awake.  

## 2021-06-15 ENCOUNTER — Telehealth: Payer: Self-pay

## 2021-06-15 NOTE — Telephone Encounter (Signed)
  Follow up Call-  Call back number 06/13/2021 01/13/2019  Post procedure Call Back phone  # [phone]  Permission to leave phone message Yes Yes  Some recent data might be hidden     Patient questions:  Do you have a fever, pain , or abdominal swelling? No. Pain Score  0 *  Have you tolerated food without any problems? Yes.    Have you been able to return to your normal activities? Yes.    Do you have any questions about your discharge instructions: Diet   No. Medications  No. Follow up visit  No.  Do you have questions or concerns about your Care? No.  Actions: * If pain score is 4 or above: No action needed, pain <4.

## 2021-06-19 ENCOUNTER — Encounter: Payer: Self-pay | Admitting: Gastroenterology

## 2021-06-19 LAB — AFP TUMOR MARKER: AFP-Tumor Marker: 3.5 ng/mL

## 2021-06-19 LAB — ANTI-SMOOTH MUSCLE ANTIBODY, IGG: Actin (Smooth Muscle) Antibody (IGG): <20 U (ref <20)

## 2021-06-19 LAB — ANA: Anti Nuclear Antibody (ANA): NEGATIVE

## 2021-06-21 NOTE — Telephone Encounter (Signed)
Pt called following up on her message. She stated to be concerned about her RBC results being high. She wants to know if that could be related to her recent cirrhosis dx. She is aware that we are awaiting to hear from Dr. Lyndel Safe to call her back.

## 2021-06-21 NOTE — Telephone Encounter (Signed)
RBC results-being slightly high is a good sign That does go with normal hemoglobin. There is nothing to be worried about RG

## 2021-06-22 ENCOUNTER — Ambulatory Visit (INDEPENDENT_AMBULATORY_CARE_PROVIDER_SITE_OTHER): Payer: BC Managed Care – PPO | Admitting: Psychology

## 2021-06-22 DIAGNOSIS — F331 Major depressive disorder, recurrent, moderate: Secondary | ICD-10-CM | POA: Diagnosis not present

## 2021-06-22 NOTE — Telephone Encounter (Signed)
From CT Abdo/pelvis-she does have liver cirrhosis From the labs, it is Ardine Eng class A RG

## 2021-06-29 ENCOUNTER — Telehealth: Payer: Self-pay | Admitting: Gastroenterology

## 2021-06-29 DIAGNOSIS — R110 Nausea: Secondary | ICD-10-CM

## 2021-06-29 NOTE — Telephone Encounter (Signed)
Pt called to f/u on Mychart messages that she sent. She is requesting a call back asap as she has been waiting for a while.

## 2021-06-29 NOTE — Telephone Encounter (Deleted)
Returned patients call. Patient inquiring about "what my scarring level is" patient wanting to know is this something MD can see on scan, can she see these same scans on MyChart. Pt states she is currently having nausea no vomiting. Also has fatigue, acidic taste in her mouth also a "gerd" like chest burn and coughing. Reports appetite has not changed. Patient also would like to know what is her risk with this scarring on liver. She also states she is taking Tylenol that was recommended with no relief in headache pain and has been taking E

## 2021-06-29 NOTE — Telephone Encounter (Signed)
Returned patients call. Patient inquiring about "what my scarring level is" patient wanting to know is this something MD can see on scan, can she see these same scans on MyChart. Pt states she is currently having nausea no vomiting. Also has fatigue, acidic taste in her mouth also a "gerd" like chest burn and coughing. Reports appetite has not changed. Patient also would like to know what is her risk with this scarring on liver. She also states she is taking Tylenol that was recommended with no relief in headache pain and has been taking Excedrin "Tylenol is like candy."  CT- 04/25/21 Last OV:05/21/21 Procedure: 06/13/21- endoscopy Hiatal Hernia with Jerrye Bushy No upcoming appts  Patient would like rec's on how she should proceed. Please advise, thank you High priority as patient been waiting for response for :a few days."

## 2021-07-02 ENCOUNTER — Ambulatory Visit (INDEPENDENT_AMBULATORY_CARE_PROVIDER_SITE_OTHER): Payer: BC Managed Care – PPO | Admitting: Psychology

## 2021-07-02 DIAGNOSIS — F331 Major depressive disorder, recurrent, moderate: Secondary | ICD-10-CM

## 2021-07-02 NOTE — Telephone Encounter (Signed)
Please make appointment in my clinic-next available RG

## 2021-07-02 NOTE — Telephone Encounter (Signed)
Spoke with patient, patients she had another episode of vomiting last night. Patient is requesting an appt as soon as possible. Pt also would like to know if Excedrin or Tylenol is best for her to take with cirrhosis. Please advise, thank you

## 2021-07-04 MED ORDER — ONDANSETRON 4 MG PO TBDP
4.0000 mg | ORAL_TABLET | Freq: Three times a day (TID) | ORAL | 0 refills | Status: DC | PRN
Start: 2021-07-04 — End: 2023-04-21

## 2021-07-04 NOTE — Telephone Encounter (Signed)
Maltodextran is not harmful for the liver directly. However, it can promote weight gain Can use it but not excessively.  All pain medications are harmful in liver disease So, try to avoid all pain medicines Still if you have to use Excedrin, use it only if absolutely needed  RG

## 2021-07-04 NOTE — Telephone Encounter (Signed)
Doubt if dizziness and nausea is because of cirrhosis. Must keep appointment with Dr. Earnest Conroy. RG

## 2021-07-04 NOTE — Telephone Encounter (Signed)
Spoke with patient patient given rec's. Rx sent to pharmacy as ordered. Patient states she would like to have referral sent to liver clinic.Referral sent.  Patient states since endoscopy she has been having bouts of coughing, wakes up during the night with coughing and sometimes vomiting. Patient would like to know why she was switched to pantoprazole and is this medication supposed to help with her cough. Please advise,thank you

## 2021-07-04 NOTE — Telephone Encounter (Signed)
N/V is not related to liver cirrhosis.  It could be related to medications like oxycodone.  If she is taking any of those medicines, please stop.  She can try Zofran 4 mg ODT Q 8 hours as needed #25 for nausea/vomiting.  As well as liver cirrhosis is concerned-I understand she has multiple questions.  If she wants to be referred to liver clinic [facility]  (managed by [facility]).  Please refer her to [place]

## 2021-07-04 NOTE — Telephone Encounter (Signed)
Attempted to contact patient, no answer. LMTCB. Rx has been sent to pharmacy.

## 2021-07-09 ENCOUNTER — Ambulatory Visit (INDEPENDENT_AMBULATORY_CARE_PROVIDER_SITE_OTHER): Payer: BC Managed Care – PPO | Admitting: Psychology

## 2021-07-09 DIAGNOSIS — F331 Major depressive disorder, recurrent, moderate: Secondary | ICD-10-CM

## 2021-07-11 ENCOUNTER — Ambulatory Visit (INDEPENDENT_AMBULATORY_CARE_PROVIDER_SITE_OTHER): Payer: BC Managed Care – PPO | Admitting: Psychology

## 2021-07-11 DIAGNOSIS — F331 Major depressive disorder, recurrent, moderate: Secondary | ICD-10-CM

## 2021-07-23 ENCOUNTER — Ambulatory Visit (INDEPENDENT_AMBULATORY_CARE_PROVIDER_SITE_OTHER): Payer: BC Managed Care – PPO | Admitting: Psychology

## 2021-07-23 DIAGNOSIS — F331 Major depressive disorder, recurrent, moderate: Secondary | ICD-10-CM | POA: Diagnosis not present

## 2021-07-26 DIAGNOSIS — R768 Other specified abnormal immunological findings in serum: Secondary | ICD-10-CM | POA: Diagnosis not present

## 2021-07-27 DIAGNOSIS — K7460 Unspecified cirrhosis of liver: Secondary | ICD-10-CM | POA: Diagnosis not present

## 2021-07-27 DIAGNOSIS — K7469 Other cirrhosis of liver: Secondary | ICD-10-CM | POA: Diagnosis not present

## 2021-07-30 ENCOUNTER — Other Ambulatory Visit: Payer: Self-pay | Admitting: Nurse Practitioner

## 2021-07-30 ENCOUNTER — Ambulatory Visit (INDEPENDENT_AMBULATORY_CARE_PROVIDER_SITE_OTHER): Payer: BC Managed Care – PPO | Admitting: Psychology

## 2021-07-30 DIAGNOSIS — F331 Major depressive disorder, recurrent, moderate: Secondary | ICD-10-CM | POA: Diagnosis not present

## 2021-07-30 DIAGNOSIS — K7469 Other cirrhosis of liver: Secondary | ICD-10-CM

## 2021-07-31 ENCOUNTER — Telehealth: Payer: Self-pay

## 2021-07-31 ENCOUNTER — Other Ambulatory Visit: Payer: Self-pay

## 2021-07-31 ENCOUNTER — Encounter: Payer: Self-pay | Admitting: Psychiatry

## 2021-07-31 ENCOUNTER — Ambulatory Visit: Payer: BC Managed Care – PPO | Admitting: Psychiatry

## 2021-07-31 VITALS — BP 98/63 | HR 87

## 2021-07-31 DIAGNOSIS — F3342 Major depressive disorder, recurrent, in full remission: Secondary | ICD-10-CM | POA: Diagnosis not present

## 2021-07-31 DIAGNOSIS — F5081 Binge eating disorder: Secondary | ICD-10-CM

## 2021-07-31 DIAGNOSIS — F419 Anxiety disorder, unspecified: Secondary | ICD-10-CM | POA: Diagnosis not present

## 2021-07-31 DIAGNOSIS — R031 Nonspecific low blood-pressure reading: Secondary | ICD-10-CM | POA: Diagnosis not present

## 2021-07-31 MED ORDER — SERTRALINE HCL 100 MG PO TABS: 100.0000 mg | ORAL_TABLET | Freq: Two times a day (BID) | ORAL | 0 refills | Status: DC

## 2021-07-31 MED ORDER — BUSPIRONE HCL 15 MG PO TABS: 15.0000 mg | ORAL_TABLET | Freq: Two times a day (BID) | ORAL | 0 refills | Status: DC

## 2021-07-31 MED ORDER — LISDEXAMFETAMINE DIMESYLATE 30 MG PO CAPS: 30.0000 mg | ORAL_CAPSULE | Freq: Every day | ORAL | 0 refills | Status: DC

## 2021-07-31 NOTE — Progress Notes (Signed)
Jacqueline Fowler [id number] December 30, 1970 50 y.o.  Subjective:   Patient ID:  Jacqueline Fowler is a 50 y.o. (DOB [birth year]) female.  Chief Complaint:  Chief Complaint  Patient presents with   Other    Binge Eating   Anxiety   Depression     HPI Jacqueline Fowler presents to the office today for follow-up of depression, anxiety, and binge eating. She reports that she scheduled earlier follow-up at the advice of her therapist. She reports sad mood and at times "not caring." She reports that she has been "sleeping a lot, like 2-3 naps a day." Has been sleeping without sleep medication. "I sleep because I don't have to think about it." She has started playing games on her phone as a form of escape. She has had worry in response to cirrhosis dx, son having health issues, and business stressors. She reports occasional panic s/s with tightness in her chest. She reports that motivation has been lower. 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 She reports that she has been procrastinating. Low energy. She reports poor focus and concentration.   She reports that she has gained 60 lbs and "am depressed about that." She reports that she has binge eating and "sneaking" food. She reports, "this is a new type of binging." She reports, "I wonder if I am self-sabotaging... I'm all or nothing." Denies any other impulsive or compulsive behaviors. Denies SI.   Son had appendicitis and appendix ruptured. Son had to be re-admitted fro DVT. He was then re-admitted for abscess.   She saw liver specialist on 07/27/21 and felt less anxious after having her questions answered.   Has started EMDR with therapist. Reports she has seen therapist weekly.   Has not spoken to her mother for 3 years.   Past Psychiatric Medication Trials: Valium Sertraline Prozac Wellbutrin-adverse reaction Rexulti Abilify- Weight gain Trazodone [place] Office Visit from 07/31/2021 in [facility] Office Visit from 03/29/2021 in [facility]  AIMS Total Score 0 0        Review of Systems:  Review of Systems  Gastrointestinal:        Chronic IBS  Musculoskeletal:  Negative for gait problem.  Neurological:  Negative for tremors.  Psychiatric/Behavioral:         Please refer to HPI   Medications: I have reviewed the patient's current medications.  Current Outpatient Medications  Medication Sig Dispense Refill   brexpiprazole (REXULTI) 2 MG TABS tablet Take 1 tablet (2 mg total) by mouth daily. 90 tablet 1   hyoscyamine (LEVBID) 0.375 MG 12 hr tablet Take 1 tablet (0.375 mg total) by mouth 2 (two) times daily. 60 tablet 6   lisdexamfetamine (VYVANSE) 30 MG capsule Take 1 capsule (30 mg total) by mouth daily. 30 capsule 0   methocarbamol (ROBAXIN) 500 MG tablet TAKE 1 TABLET BY MOUTH THREE TIMES A DAY AS NEEDED FOR 10 DAYS     ondansetron (ZOFRAN ODT) 4 MG disintegrating tablet Take 1 tablet (4 mg total) by mouth every 8 (eight) hours as needed for nausea or vomiting. 25 tablet 0   oxyCODONE-acetaminophen (PERCOCET/ROXICET) 5-325 MG tablet Take 1 tablet by mouth 2 (two) times daily as needed.     pantoprazole (PROTONIX) 40 MG tablet Take 1 tablet (40 mg total) by mouth daily. 30 tablet 11   traZODone (DESYREL)  100 MG tablet Take 1/2-1 tablet po QHS prn insomnia 30 tablet 2   azithromycin (ZITHROMAX) 500 MG tablet Take 1 tablet (500 mg total) by mouth daily. (Patient not taking: No sig reported) 3 tablet 0   busPIRone (BUSPAR) 15 MG tablet Take 1 tablet (15 mg total) by mouth 2 (two) times daily. 180 tablet 0   ondansetron (ZOFRAN) 4 MG tablet Take 1 tablet (4 mg total) by mouth every 8 (eight) hours as needed for nausea or vomiting. 10 tablet 0   sertraline (ZOLOFT) 100 MG tablet Take 1 tablet (100 mg total) by mouth in the morning and at bedtime. 180 tablet 0   Current Facility-Administered  Medications  Medication Dose Route Frequency Provider Last Rate Last Admin   0.9 %  sodium chloride infusion  500 mL Intravenous Once [name], MD        Medication Side Effects: None  Allergies: No Known Allergies  Past Medical History:  Diagnosis Date   Anxiety    Depression    Gallstones 2004   GERD (gastroesophageal reflux disease)    IBS (irritable bowel syndrome)    Obesity    UTI (urinary tract infection)     Past Medical History, Surgical history, Social history, and Family history were reviewed and updated as appropriate.   Please see review of systems for further details on the patient's review from today.   Objective:   Physical Exam:  BP 98/63   Pulse 87   Physical Exam Constitutional:      General: She is not in acute distress. Musculoskeletal:        General: No deformity.  Neurological:     Mental Status: She is alert and oriented to person, place, and time.     Coordination: Coordination normal.  Psychiatric:        Attention and Perception: Attention and perception normal. She does not perceive auditory or visual hallucinations.        Mood and Affect: Mood is anxious and depressed. Affect is tearful. Affect is not labile, blunt, angry or inappropriate.        Speech: Speech normal.        Behavior: Behavior normal. Behavior is cooperative.        Thought Content: Thought content normal. Thought content is not paranoid or delusional. Thought content does not include homicidal or suicidal ideation. Thought content does not include homicidal or suicidal plan.        Cognition and Memory: Cognition and memory normal.        Judgment: Judgment normal.     Comments: Insight intact    Lab Review:     Component Value Date/Time   NA 137 06/13/2021 0832   K 4.4 06/13/2021 0832   CL 103 06/13/2021 0832   CO2 22 06/13/2021 0832   GLUCOSE 95 06/13/2021 0832   BUN 20 06/13/2021 0832   CREATININE 0.83 06/13/2021 0832   CALCIUM 9.7 06/13/2021 0832    PROT 7.3 06/13/2021 0832   ALBUMIN 4.4 06/13/2021 0832   AST 23 06/13/2021 0832   ALT 21 06/13/2021 0832   ALKPHOS 52 06/13/2021 0832   BILITOT 0.6 06/13/2021 0832       Component Value Date/Time   WBC 4.9 06/13/2021 0832   RBC 5.25 (H) 06/13/2021 0832   HGB 14.7 06/13/2021 0832   HCT 43.9 06/13/2021 0832   PLT 194.0 06/13/2021 0832   MCV 83.6 06/13/2021 0832   MCHC 33.5 06/13/2021 0832   RDW 14.5  06/13/2021 0832   LYMPHSABS 1.1 06/13/2021 0832   MONOABS 0.3 06/13/2021 0832   EOSABS 0.0 06/13/2021 0832   BASOSABS 0.0 06/13/2021 0832    No results found for: POCLITH, LITHIUM   No results found for: PHENYTOIN, PHENOBARB, VALPROATE, CBMZ   .res Assessment: Plan:   Pt seen for 30 minutes and time spent counseling pt regarding treatment options for Binge Eating Disorder. Discussed potential benefits, risks, and side effects of Vyvanse for Binge Eating Disorder. Discussed potential benefits, risks, and side effects of stimulants with patient to include increased heart rate, palpitations, insomnia, increased anxiety, increased irritability, or decreased appetite.  Instructed patient to contact office if experiencing any significant tolerability issues. Pt agrees to trial of Vyvanse. Will start Vyvanse 30 mg po qd for binge eating disorder.  Continue Rexulti 2 mg po qd for mood and anxiety.  Continue Buspar 15 mg po BID for anxiety.  Continue Sertraline 100 mg po BID for anxiety and depression. Continue Trazodone 100 mg 1/2-1 tab po QHS prn insomnia.  Pt to follow-up in 4 weeks or sooner if clinically indicated.  Patient advised to contact office with any questions, adverse effects, or acute worsening in signs and symptoms. Recommend continuing therapy with Bambi Cottle, LCSW.   Armeda was seen today for other, anxiety and depression.  Diagnoses and all orders for this visit:  Binge eating disorder -     lisdexamfetamine (VYVANSE) 30 MG capsule; Take 1 capsule (30 mg total) by  mouth daily.  Anxiety disorder, unspecified type -     busPIRone (BUSPAR) 15 MG tablet; Take 1 tablet (15 mg total) by mouth 2 (two) times daily. -     sertraline (ZOLOFT) 100 MG tablet; Take 1 tablet (100 mg total) by mouth in the morning and at bedtime.  Recurrent major depressive disorder, in full remission ([place]) -     sertraline (ZOLOFT) 100 MG tablet; Take 1 tablet (100 mg total) by mouth in the morning and at bedtime.    Please see After Visit Summary for patient specific instructions.  Future Appointments  Date Time Provider [place]  08/06/2021  8:00 AM Cottle, Lucious Groves, LCSW [facility] None  08/13/2021 12:00 PM Cottle, Bambi G, LCSW [facility] None  08/15/2021  8:00 AM [facility] [place] [facility]  08/15/2021  8:45 AM [facility] [place] [facility]  08/16/2021  3:40 PM [name], MD [facility]  08/20/2021  8:00 AM Cottle, Lucious Groves, LCSW [facility] None  08/28/2021  1:30 PM Thayer Headings, PMHNP [facility] None    No orders of the defined types were placed in this encounter.   -------------------------------

## 2021-07-31 NOTE — Progress Notes (Signed)
   07/31/21 1525  Facial and Oral Movements  Muscles of Facial Expression 0  Lips and Perioral Area 0  Jaw 0  Tongue 0  Extremity Movements  Upper (arms, wrists, hands, fingers) 0  Lower (legs, knees, ankles, toes) 0  Trunk Movements  Neck, shoulders, hips 0  Overall Severity  Severity of abnormal movements (highest score from questions above) 0  Incapacitation due to abnormal movements 0  Patient's awareness of abnormal movements (rate only patient's report) 0  AIMS Total Score  AIMS Total Score 0

## 2021-07-31 NOTE — Telephone Encounter (Signed)
Prior Authorization submitted and approved for VYVANSE 30 MG effective 07/31/2021-07/30/2022 with [employer]

## 2021-08-06 ENCOUNTER — Ambulatory Visit (INDEPENDENT_AMBULATORY_CARE_PROVIDER_SITE_OTHER): Payer: BC Managed Care – PPO | Admitting: Psychology

## 2021-08-06 DIAGNOSIS — F331 Major depressive disorder, recurrent, moderate: Secondary | ICD-10-CM | POA: Diagnosis not present

## 2021-08-08 ENCOUNTER — Telehealth: Payer: Self-pay | Admitting: Psychiatry

## 2021-08-08 DIAGNOSIS — F5081 Binge eating disorder: Secondary | ICD-10-CM

## 2021-08-08 MED ORDER — LISDEXAMFETAMINE DIMESYLATE 50 MG PO CAPS: 50.0000 mg | ORAL_CAPSULE | Freq: Every day | ORAL | 0 refills | Status: DC

## 2021-08-08 NOTE — Telephone Encounter (Signed)
Pt has been taking the Vyvanse for a week now and doesn't feel any different.  She is asking if she should increase the dosage.

## 2021-08-08 NOTE — Telephone Encounter (Signed)
Patient called back she would like some clarification regarding previous messages. She isn't sure exactly how much or how many capsules she is supposed to. Or if she should stop taking the current prescription or take the new prescription instead, or take both. Pls rtc [phone].

## 2021-08-08 NOTE — Telephone Encounter (Signed)
Please let her know script was just sent for 50 mg capsules, however insurance may not cover it this soon.

## 2021-08-08 NOTE — Telephone Encounter (Signed)
Should I let the pt know to give the med more time?Or do you agree to an increase

## 2021-08-08 NOTE — Telephone Encounter (Signed)
Please clarify

## 2021-08-08 NOTE — Telephone Encounter (Signed)
Bryson Ha at [facility] [address] Alert [phone]) called regarding the new script sent today for 50mg .  Pt picked up the 30mg  last week, so the phamacy is wanting to know what to do.  Pls call them back.

## 2021-08-09 ENCOUNTER — Other Ambulatory Visit: Payer: BC Managed Care – PPO

## 2021-08-09 NOTE — Telephone Encounter (Signed)
Informed pt and pharmacy

## 2021-08-10 ENCOUNTER — Ambulatory Visit: Payer: BC Managed Care – PPO | Admitting: Psychiatry

## 2021-08-13 ENCOUNTER — Ambulatory Visit (INDEPENDENT_AMBULATORY_CARE_PROVIDER_SITE_OTHER): Payer: BC Managed Care – PPO | Admitting: Psychology

## 2021-08-13 ENCOUNTER — Ambulatory Visit: Payer: BC Managed Care – PPO | Admitting: Psychiatry

## 2021-08-13 DIAGNOSIS — F331 Major depressive disorder, recurrent, moderate: Secondary | ICD-10-CM | POA: Diagnosis not present

## 2021-08-15 ENCOUNTER — Ambulatory Visit
Admission: RE | Admit: 2021-08-15 | Discharge: 2021-08-15 | Disposition: A | Discharge status: Home / Self Care | Payer: BC Managed Care – PPO | Source: Ambulatory Visit | Attending: Nurse Practitioner | Admitting: Nurse Practitioner

## 2021-08-15 DIAGNOSIS — K7469 Other cirrhosis of liver: Secondary | ICD-10-CM

## 2021-08-15 DIAGNOSIS — N200 Calculus of kidney: Secondary | ICD-10-CM | POA: Diagnosis not present

## 2021-08-15 DIAGNOSIS — K7689 Other specified diseases of liver: Secondary | ICD-10-CM | POA: Diagnosis not present

## 2021-08-16 ENCOUNTER — Ambulatory Visit: Payer: BC Managed Care – PPO | Admitting: Gastroenterology

## 2021-08-20 ENCOUNTER — Ambulatory Visit (INDEPENDENT_AMBULATORY_CARE_PROVIDER_SITE_OTHER): Payer: BC Managed Care – PPO | Admitting: Psychology

## 2021-08-20 DIAGNOSIS — F331 Major depressive disorder, recurrent, moderate: Secondary | ICD-10-CM | POA: Diagnosis not present

## 2021-08-28 ENCOUNTER — Other Ambulatory Visit: Payer: Self-pay

## 2021-08-28 ENCOUNTER — Encounter: Payer: Self-pay | Admitting: Psychiatry

## 2021-08-28 ENCOUNTER — Ambulatory Visit: Payer: BC Managed Care – PPO | Admitting: Psychiatry

## 2021-08-28 DIAGNOSIS — F5081 Binge eating disorder: Secondary | ICD-10-CM

## 2021-08-28 NOTE — Progress Notes (Signed)
Jacqueline Fowler [id number] [birth year] 50 y.o.  Subjective:   Patient ID:  Jacqueline Fowler is a 50 y.o. (DOB Sep 08, 1971) female.  Chief Complaint:  Chief Complaint  Patient presents with   Follow-up    Binge eating, depression, anxiety     HPI Jacqueline Fowler presents to the office today for follow-up of binge eating, anxiety, and depression. She reports that she did not notice any effect with Vyvanse 30 mg. She reports that since increase of Vyvanse "more awake, not as hungry." She notices some decrease in appetite and will remember to eat every 2 hours. She reports that she continues to want to overeat and is no longer "hiding and binging." Continues to notice an impulse to binge. She reports procrastination has improved some over the last few weeks. She has been cleaning and decluttering.   "I don't feel depressed right now." Notices some worry, such as about finances. Denies any full blown panic attacks. Sleeping well. Energy and motivation have improved. Concentration has been good and reports that she has been able to more intensely study the Bible recently. Denies SI.   No longer needing naps.   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 Husband contemplating returning to work full-time if they change their business.   Past Psychiatric Medication Trials: Valium Sertraline Prozac Wellbutrin-adverse reaction Rexulti Abilify- Weight gain Trazodone [place] Office Visit from 07/31/2021 in [place] [facility] Visit from 03/29/2021 in [facility]  AIMS Total Score 0 0        Review of Systems:  Review of Systems  Cardiovascular:  Negative for palpitations.  Musculoskeletal:  Negative for gait problem.  Neurological:  Negative for tremors.  Psychiatric/Behavioral:         Please refer to HPI   Medications: I have reviewed the patient's current medications.  Current Outpatient Medications  Medication  Sig Dispense Refill   brexpiprazole (REXULTI) 2 MG TABS tablet Take 1 tablet (2 mg total) by mouth daily. 90 tablet 1   busPIRone (BUSPAR) 15 MG tablet Take 1 tablet (15 mg total) by mouth 2 (two) times daily. 180 tablet 0   hyoscyamine (LEVBID) 0.375 MG 12 hr tablet Take 1 tablet (0.375 mg total) by mouth 2 (two) times daily. 60 tablet 6   Multiple Vitamins-Minerals (CENTRUM SILVER 50+WOMEN PO) Take by mouth.     oxyCODONE-acetaminophen (PERCOCET/ROXICET) 5-325 MG tablet Take 1 tablet by mouth 2 (two) times daily as needed.     pantoprazole (PROTONIX) 40 MG tablet Take 1 tablet (40 mg total) by mouth daily. 30 tablet 11   Pyridoxine HCl (VITAMIN B-6 PO) Take by mouth.     sertraline (ZOLOFT) 100 MG tablet Take 1 tablet (100 mg total) by mouth in the morning and at bedtime. 180 tablet 0   VITAMIN D PO Take by mouth.     lisdexamfetamine (VYVANSE) 30 MG capsule Take 2 capsules (60 mg total) by mouth daily. 30 capsule 0   methocarbamol (ROBAXIN) 500 MG tablet TAKE 1 TABLET BY MOUTH THREE TIMES A DAY AS NEEDED FOR 10 DAYS     ondansetron (ZOFRAN ODT) 4 MG disintegrating tablet Take 1 tablet (4 mg total) by mouth every 8 (eight) hours as needed for nausea or vomiting. 25 tablet 0   ondansetron (ZOFRAN) 4 MG tablet Take 1 tablet (4 mg total) by mouth every 8 (eight) hours as needed for nausea or vomiting. 10 tablet 0   traZODone (DESYREL)  100 MG tablet Take 1/2-1 tablet po QHS prn insomnia 30 tablet 2   Current Facility-Administered Medications  Medication Dose Route Frequency Provider Last Rate Last Admin   0.9 %  sodium chloride infusion  500 mL Intravenous Once [name], MD        Medication Side Effects: None  Allergies: No Known Allergies  Past Medical History:  Diagnosis Date   Anxiety    Depression    Gallstones 2004   GERD (gastroesophageal reflux disease)    IBS (irritable bowel syndrome)    Obesity    UTI (urinary tract infection)     Past Medical History, Surgical  history, Social history, and Family history were reviewed and updated as appropriate.   Please see review of systems for further details on the patient's review from today.   Objective:   Physical Exam:  BP 108/68   Pulse 96   Physical Exam Constitutional:      General: She is not in acute distress. Musculoskeletal:        General: No deformity.  Neurological:     Mental Status: She is alert and oriented to person, place, and time.     Coordination: Coordination normal.  Psychiatric:        Attention and Perception: Attention and perception normal. She does not perceive auditory or visual hallucinations.        Mood and Affect: Mood normal. Mood is not anxious or depressed. Affect is not labile, blunt, angry or inappropriate.        Speech: Speech normal.        Behavior: Behavior normal.        Thought Content: Thought content normal. Thought content is not paranoid or delusional. Thought content does not include homicidal or suicidal ideation. Thought content does not include homicidal or suicidal plan.        Cognition and Memory: Cognition and memory normal.        Judgment: Judgment normal.     Comments: Insight intact    Lab Review:     Component Value Date/Time   NA 137 06/13/2021 0832   K 4.4 06/13/2021 0832   CL 103 06/13/2021 0832   CO2 22 06/13/2021 0832   GLUCOSE 95 06/13/2021 0832   BUN 20 06/13/2021 0832   CREATININE 0.83 06/13/2021 0832   CALCIUM 9.7 06/13/2021 0832   PROT 7.3 06/13/2021 0832   ALBUMIN 4.4 06/13/2021 0832   AST 23 06/13/2021 0832   ALT 21 06/13/2021 0832   ALKPHOS 52 06/13/2021 0832   BILITOT 0.6 06/13/2021 0832       Component Value Date/Time   WBC 4.9 06/13/2021 0832   RBC 5.25 (H) 06/13/2021 0832   HGB 14.7 06/13/2021 0832   HCT 43.9 06/13/2021 0832   PLT 194.0 06/13/2021 0832   MCV 83.6 06/13/2021 0832   MCHC 33.5 06/13/2021 0832   RDW 14.5 06/13/2021 0832   LYMPHSABS 1.1 06/13/2021 0832   MONOABS 0.3 06/13/2021 0832    EOSABS 0.0 06/13/2021 0832   BASOSABS 0.0 06/13/2021 0832    No results found for: POCLITH, LITHIUM   No results found for: PHENYTOIN, PHENOBARB, VALPROATE, CBMZ   .res Assessment: Plan:   Will increase Vyvanse to 60 mg by taking two of remaining Vyvanse 30 mg capsules since pt reports that Vyvanse 50 mg has been partially effective and that there is some slight room for improvement. Pt to contact provider re: response to 60 mg dose and then will determine dose  of next script. Continue Buspar 15 mg po BID for anxiety.  Continue Rexulti 2 mg po qd for depression.  Continue Sertraline 100 mg po BID for anxiety and depression.  Continue Trazodone 100 mg 1/2-1 tab po QHS prn insomnia.  Recommend continuing therapy with Bambi Cottle, LCSW. Patient advised to contact office with any questions, adverse effects, or acute worsening in signs and symptoms.  [place] was seen today for follow-up.  Diagnoses and all orders for this visit:  Binge eating disorder -     lisdexamfetamine (VYVANSE) 30 MG capsule; Take 2 capsules (60 mg total) by mouth daily.    Please see After Visit Summary for patient specific instructions.  Future Appointments  Date Time Provider [place]  09/07/2021 10:00 AM Cottle, Lucious [place]ves, LCSW [facility] None  09/18/2021  8:00 AM Cottle, Bambi G, LCSW [facility] None  09/26/2021 10:00 AM Cottle, Bambi G, LCSW [facility] None  10/02/2021  8:00 AM Cottle, Bambi G, LCSW [facility] None  10/11/2021  8:00 AM Cottle, Bambi G, LCSW [facility] None    No orders of the defined types were placed in this encounter.   -------------------------------

## 2021-08-30 MED ORDER — LISDEXAMFETAMINE DIMESYLATE 30 MG PO CAPS: 60.0000 mg | ORAL_CAPSULE | Freq: Every day | ORAL | 0 refills | Status: DC

## 2021-08-31 MED ORDER — LISDEXAMFETAMINE DIMESYLATE 70 MG PO CAPS: 70.0000 mg | ORAL_CAPSULE | Freq: Every day | ORAL | 0 refills | Status: DC

## 2021-09-04 ENCOUNTER — Other Ambulatory Visit: Payer: Self-pay | Admitting: Psychiatry

## 2021-09-04 DIAGNOSIS — F419 Anxiety disorder, unspecified: Secondary | ICD-10-CM

## 2021-09-06 MED ORDER — LISDEXAMFETAMINE DIMESYLATE 70 MG PO CAPS: 70.0000 mg | ORAL_CAPSULE | Freq: Every day | ORAL | 0 refills | Status: DC

## 2021-09-06 NOTE — Addendum Note (Signed)
Addended by: Sharyl Nimrod on: 09/06/2021 09:54 AM   Modules accepted: Orders

## 2021-09-06 NOTE — Telephone Encounter (Signed)
Pt states that she has been having severe heartburn at night since her EGD 06/12/2021. Pt states that she experiences the severe heartburn only at night. States that she has no symptoms during the day. Pt requesting if there is something that she can take at night to relived the severe reflux that sometime causes here to have the severe acid and episode of vomiting.  Please Advise

## 2021-09-07 ENCOUNTER — Ambulatory Visit (INDEPENDENT_AMBULATORY_CARE_PROVIDER_SITE_OTHER): Payer: BC Managed Care – PPO | Admitting: Psychology

## 2021-09-07 DIAGNOSIS — F331 Major depressive disorder, recurrent, moderate: Secondary | ICD-10-CM | POA: Diagnosis not present

## 2021-09-10 NOTE — Telephone Encounter (Signed)
Please see notes below and advise 

## 2021-09-12 ENCOUNTER — Telehealth: Payer: Self-pay | Admitting: Gastroenterology

## 2021-09-12 NOTE — Telephone Encounter (Signed)
Patient is calling to follow up on previous patient message said she has been waiting for a week.

## 2021-09-13 ENCOUNTER — Other Ambulatory Visit: Payer: Self-pay

## 2021-09-13 DIAGNOSIS — K219 Gastro-esophageal reflux disease without esophagitis: Secondary | ICD-10-CM

## 2021-09-13 DIAGNOSIS — K449 Diaphragmatic hernia without obstruction or gangrene: Secondary | ICD-10-CM

## 2021-09-13 MED ORDER — PANTOPRAZOLE SODIUM 40 MG PO TBEC: 40.0000 mg | DELAYED_RELEASE_TABLET | Freq: Two times a day (BID) | ORAL | 11 refills | Status: DC

## 2021-09-13 MED ORDER — FAMOTIDINE 20 MG PO TABS: 20.0000 mg | ORAL_TABLET | Freq: Every day | ORAL | 0 refills | Status: DC

## 2021-09-13 NOTE — Telephone Encounter (Signed)
Please see notes below and advise 

## 2021-09-13 NOTE — Telephone Encounter (Signed)
Pt made aware of Dr. Lyndel Safe recommendations Pt made aware that prescriptions were sent to pharmacy Pt verbalized understanding with all questions answered.

## 2021-09-13 NOTE — Telephone Encounter (Signed)
Prescriptions sent to pharmacy Left message for pt to call back

## 2021-09-13 NOTE — Telephone Encounter (Signed)
Pt made aware of Dr. Lyndel Safe recommendations; Prescriptions sent to pharmacy Pt verbalized understanding with all questions answered.

## 2021-09-13 NOTE — Telephone Encounter (Signed)
some reflux is expected after dilation.    Lets increase Protonix to 40 mg p.o. twice daily -  half an hour before breakfast and half an hour before supper #60,  11 refill  if still with problems add Pepcid 20 mg p.o. nightly. #30  RG

## 2021-09-18 ENCOUNTER — Ambulatory Visit (INDEPENDENT_AMBULATORY_CARE_PROVIDER_SITE_OTHER): Payer: BC Managed Care – PPO | Admitting: Psychology

## 2021-09-18 DIAGNOSIS — F331 Major depressive disorder, recurrent, moderate: Secondary | ICD-10-CM | POA: Diagnosis not present

## 2021-09-26 ENCOUNTER — Ambulatory Visit (INDEPENDENT_AMBULATORY_CARE_PROVIDER_SITE_OTHER): Payer: BC Managed Care – PPO | Admitting: Psychology

## 2021-09-26 DIAGNOSIS — F331 Major depressive disorder, recurrent, moderate: Secondary | ICD-10-CM | POA: Diagnosis not present

## 2021-10-02 ENCOUNTER — Ambulatory Visit (INDEPENDENT_AMBULATORY_CARE_PROVIDER_SITE_OTHER): Payer: BC Managed Care – PPO | Admitting: Psychology

## 2021-10-02 DIAGNOSIS — F331 Major depressive disorder, recurrent, moderate: Secondary | ICD-10-CM

## 2021-10-04 ENCOUNTER — Other Ambulatory Visit: Payer: Self-pay | Admitting: Physician Assistant

## 2021-10-06 ENCOUNTER — Telehealth: Payer: Self-pay

## 2021-10-06 ENCOUNTER — Other Ambulatory Visit: Payer: Self-pay | Admitting: Gastroenterology

## 2021-10-06 DIAGNOSIS — K449 Diaphragmatic hernia without obstruction or gangrene: Secondary | ICD-10-CM

## 2021-10-06 DIAGNOSIS — K219 Gastro-esophageal reflux disease without esophagitis: Secondary | ICD-10-CM

## 2021-10-06 NOTE — Telephone Encounter (Signed)
Prior Authorization submitted and approved for REXULTI 2 MG effective 10/06/2021-10/05/2022 with [employer] ID# [id number]

## 2021-10-11 ENCOUNTER — Ambulatory Visit (INDEPENDENT_AMBULATORY_CARE_PROVIDER_SITE_OTHER): Payer: BC Managed Care – PPO | Admitting: Psychology

## 2021-10-11 DIAGNOSIS — F331 Major depressive disorder, recurrent, moderate: Secondary | ICD-10-CM

## 2021-10-18 DIAGNOSIS — K080 Exfoliation of teeth due to systemic causes: Secondary | ICD-10-CM | POA: Diagnosis not present

## 2021-10-18 DIAGNOSIS — S01502A Unspecified open wound of oral cavity, initial encounter: Secondary | ICD-10-CM | POA: Diagnosis not present

## 2021-10-19 ENCOUNTER — Ambulatory Visit: Payer: BC Managed Care – PPO | Admitting: Psychology

## 2021-10-19 DIAGNOSIS — M5459 Other low back pain: Secondary | ICD-10-CM | POA: Diagnosis not present

## 2021-10-23 DIAGNOSIS — K1370 Unspecified lesions of oral mucosa: Secondary | ICD-10-CM | POA: Diagnosis not present

## 2021-10-31 DIAGNOSIS — M545 Low back pain, unspecified: Secondary | ICD-10-CM | POA: Diagnosis not present

## 2021-11-02 ENCOUNTER — Ambulatory Visit (INDEPENDENT_AMBULATORY_CARE_PROVIDER_SITE_OTHER): Payer: BC Managed Care – PPO | Admitting: Psychology

## 2021-11-02 DIAGNOSIS — F331 Major depressive disorder, recurrent, moderate: Secondary | ICD-10-CM

## 2021-11-02 NOTE — Progress Notes (Signed)
[facility] Counselor/Therapist Progress Note  Patient ID: DAIJANAE RAFALSKI, MRN: [id number],    Date: 11/02/2021  Time Spent: 60 minutes  Treatment Type: Individual Therapy  Reported Symptoms: depression  Mental Status Exam: Appearance:  Casual     Behavior: Appropriate  Motor: Normal  Speech/Language:  Normal Rate  Affect: Blunt  Mood: depressed  Thought process: normal  Thought content:   WNL  Sensory/Perceptual disturbances:   WNL  Orientation: oriented to person, place, time/date, and situation  Attention: Good  Concentration: Good  Memory: WNL  Fund of knowledge:  Good  Insight:   Good  Judgment:  Good  Impulse Control: Good   Risk Assessment: Danger to Self:  No Self-injurious Behavior: No Danger to Others: No Duty to Warn:no Physical Aggression / Violence:No  Access to Firearms a concern: No  Gang Involvement:No   Subjective: The patient attended a face-to-face individual therapy session via video visit today.  The patient gave verbal consent for the session to be on video on WebEx.  The patient was in her home alone and the therapist was in the office.  The patient reports that she has been having some health issues and is very concerned about these.  She has been having some difficulty in managing her emotions because she felt like she was not being supported by her husband.  We discussed how they worked through that situation and it seems to be a little better now.  The patient is taking 1 thing at a time as we had discussed previously and seems to be doing better with managing things when she does not get overwhelmed by them.  We will continue to address how to manage it motions using cognitive processing and reframing.  Interventions: Cognitive Behavioral Therapy  Diagnosis:Major depressive disorder, recurrent episode, moderate (HCC)  Plan: Please see Treatment plan in Therapy charts with target date of 05/01/2022 for goals and progress towards  goals.  Will continue to see the patient at least biweekly and work with her using CBT, Insight oriented approach and EMDR.  Patient approved the treatment plan.  Syler Norcia G Adriane Guglielmo, LCSW

## 2021-11-06 DIAGNOSIS — M5451 Vertebrogenic low back pain: Secondary | ICD-10-CM | POA: Diagnosis not present

## 2021-11-07 DIAGNOSIS — R300 Dysuria: Secondary | ICD-10-CM | POA: Diagnosis not present

## 2021-11-07 DIAGNOSIS — N816 Rectocele: Secondary | ICD-10-CM | POA: Diagnosis not present

## 2021-11-07 DIAGNOSIS — N8110 Cystocele, unspecified: Secondary | ICD-10-CM | POA: Diagnosis not present

## 2021-11-07 NOTE — Telephone Encounter (Signed)
Pt stated that she was having pressure in her rectum and questioning is this something that Dr. Lyndel Safe can address at the office visit. Pt was notified that Dr. Lyndel Safe will address her GI related issues at her office Visit. Pt verbalized understanding with all questions answered.

## 2021-11-09 ENCOUNTER — Telehealth: Payer: Self-pay | Admitting: Gastroenterology

## 2021-11-09 ENCOUNTER — Encounter: Payer: Self-pay | Admitting: Gastroenterology

## 2021-11-09 ENCOUNTER — Other Ambulatory Visit: Payer: Self-pay | Admitting: Gastroenterology

## 2021-11-09 ENCOUNTER — Ambulatory Visit (INDEPENDENT_AMBULATORY_CARE_PROVIDER_SITE_OTHER): Payer: BC Managed Care – PPO | Admitting: Gastroenterology

## 2021-11-09 VITALS — BP 122/78 | HR 100 | Ht 66.0 in | Wt 261.2 lb

## 2021-11-09 DIAGNOSIS — K219 Gastro-esophageal reflux disease without esophagitis: Secondary | ICD-10-CM | POA: Diagnosis not present

## 2021-11-09 DIAGNOSIS — K7460 Unspecified cirrhosis of liver: Secondary | ICD-10-CM

## 2021-11-09 DIAGNOSIS — K449 Diaphragmatic hernia without obstruction or gangrene: Secondary | ICD-10-CM | POA: Diagnosis not present

## 2021-11-09 MED ORDER — SUCRALFATE 1 GM/10ML PO SUSP: 1.0000 g | Freq: Four times a day (QID) | ORAL | 1 refills | Status: DC

## 2021-11-09 MED ORDER — AMBULATORY NON FORMULARY MEDICATION: 2 refills | Status: DC

## 2021-11-09 MED ORDER — SUCRALFATE 1 G PO TABS: 1.0000 g | ORAL_TABLET | Freq: Four times a day (QID) | ORAL | 1 refills | Status: DC

## 2021-11-09 NOTE — Patient Instructions (Addendum)
If you are age 50 or older, your body mass index should be between 23-30. Your Body mass index is 42.17 kg/m. If this is out of the aforementioned range listed, please consider follow up with your Primary Care Provider.  If you are age 41 or younger, your body mass index should be between 19-25. Your Body mass index is 42.17 kg/m. If this is out of the aformentioned range listed, please consider follow up with your Primary Care Provider.   ________________________________________________________  The [facility] providers would like to encourage you to use [facility] to communicate with providers for non-urgent requests or questions.  Due to long hold times on the telephone, sending your provider a message by [facility] may be a faster and more efficient way to get a response.  Please allow 48 business hours for a response.  Please remember that this is for non-urgent requests.  _______________________________________________________  We have sent the following medications to your pharmacy for you to pick up at your convenience: Carafate solution GI cocktail-Prevo drug  Avoid hepatotoxic meds. Continue with weight loss and watch calorie intake.  Please call with any questions or concerns.  Thank you,  Dr. [name]

## 2021-11-09 NOTE — Telephone Encounter (Signed)
Patient is aware 

## 2021-11-09 NOTE — Telephone Encounter (Signed)
Patient called and said that her insurance would not cover the Carafate and that we would have to either call the insurance and get it authorized or give it to her in tablet form.  Please call her and let her know what we're going to do.  Thank you.

## 2021-11-09 NOTE — Progress Notes (Signed)
Chief Complaint: FU  Referring Provider:  Bonnita Nasuti, MD      ASSESSMENT AND PLAN;   #1. Liver cirrhosis (Child's class A) on CT 04/2021 with mild splenomegaly, likely d/t NAFLD. No ETOH.   #2. GERD with small HH and eso stricture s/p dil.  #3. IBS-D. Assoc Post-chole diarrhea, neg colon with Bx 01/2019. Stool studies + Campylobacter.  Treated with azithromycin  #4. H/O tubular adenoma 01/2019. Rpt in 5 yrs (01/2024).  #5. Incidental mild mesenteric lymphadenopathy (likely reactive per radiology).  Plan: -Increased Protonix 40mg  po BID -pepcid 20mg  po QHS -Carafate elixir or tablets 1g po QID x 4 weeks -GI cocktail .72ml Q4-6 hrs prn #240 ml -Continue wt loss.  Watch calorie intake.  Avoid hepatotoxic meds. -Recommended to reduce weight gradually.  She has gained weight as below. -Nonpharmacologic means of reflux control. -Would recommend CT Abdo/pelvis in 1 year for FU (04/2022) of mild mesenteric adenopathy.   HPI:    Jacqueline Fowler is a 50 y.o. female  For follow-up visit.  Reflux- worsening ever since dil 06/13/2021.  No further dysphagia.  She is having waterbrash especially at night. No N/V.  Unfortunately, she has gained weight as below.  Has diarrhea at baseline 2-3/day, after eating, without any nocturnal symptoms.  No nausea, vomiting, heartburn, regurgitation, odynophagia or dysphagia.  No significant constipation.  No melena or hematochezia. No abdominal pain.  No H/O itching, skin lesions, easy bruisability, intake of OTC meds including diet pills, herbal medications, anabolic steroids or Tylenol. There is no H/O blood transfusions, IVDA or FH of liver disease. No jaundice, dark urine or pale stools. No alcohol abuse.   Wt Readings from Last 3 Encounters:  11/09/21 261 lb 4 oz (118.5 kg)  06/13/21 236 lb (107 kg)  05/21/21 236 lb (107 kg)   Past GI work-up:  EGD with dil 06/13/2021 - Benign-appearing esophageal stenosis. Dilated. Bx- neg EoE - Small  HH - Gastritis. Bx- neg HP - Normal examined duodenum. Bx- neg celiac  CT AP with contrast 04/25/2021 1. Cirrhotic hepatic morphology with evidence of portal hypertension including splenomegaly. 2. Enlarged upper abdominal lymph nodes, nonspecific and in the setting of hepatocellular disease are possibly reactive. 3. Punctate nonobstructive left renal stone. 4. Small hiatal hernia. Enteric contrast within the distal esophagus may reflect gastroesophageal reflux or esophageal dysmotility. 5. Aortic Atherosclerosis (ICD10-I70.0).  Colonoscopy 01/2019 -Colonic polyp s/p polypectomy. Bx- SSA. Rpt in 5 yrs -Small internal hemorrhoids. -Otherwise normal colonoscopy to TI. -Neg random colon biopsies for microscopic colitis. -Neg random TI biopsies.  [place] 08/2021 1. Patent hepatic vasculature with normal velocities and directional flow. 2. Increased, slightly coarsened echogenicity of the hepatic parenchyma with nodularity hepatic contour compatible with provided history of cirrhosis. No discrete hepatic lesions though further evaluation with abdominal MRI could be performed as indicated. 3. Borderline splenomegaly as could be seen in the setting of portal venous hypertension. No ascites. 4. Punctate (approximately 6 mm) nonobstructing left-sided renal stone, as demonstrated on abdominal CT performed 04/2021. 5. Post cholecystectomy.  WU for liver disease 04/2021 -Nl AFP, ceruloplasmin. Neg acute hepatitis profile.  Not immune to hepatitis A/B.  Got first vaccine. -Iron saturation 15% -LFTs: Alb 4.0, AST 23, ALT 26, TB 0.4 -PT INR 1.1 -ANA +ve 1:160 -Neg celiac screen -ASMA <20 -IgG Nl  CT 04/2009 -Fatty liver Past Medical History:  Diagnosis Date   Anxiety    Depression    Gallstones 2004   GERD (gastroesophageal reflux disease)  IBS (irritable bowel syndrome)    Obesity    UTI (urinary tract infection)     Past Surgical History:  Procedure Laterality Date   BREAST  BIOPSY Right    age 68 or 61 cyst that was removed   CHOLECYSTECTOMY  2004   COLONOSCOPY  08/07/2009   Small internal hemorrhoids. Otherwise normal colonoscopy to TI.    ESOPHAGOGASTRODUODENOSCOPY  05/31/2008   Esophageal stricture status post dilation. Mild gastritis.    esophagus stretching     IUD REMOVAL     tummy tuck  03/20/2020   UPPER GASTROINTESTINAL ENDOSCOPY      Family History  Problem Relation Age of Onset   Depression Son    Colon cancer Neg Hx    Esophageal cancer Neg Hx    Rectal cancer Neg Hx    Stomach cancer Neg Hx     Social History   Tobacco Use   Smoking status: Former   Smokeless tobacco: Never   Tobacco comments:    quit 2001 smoking  Vaping Use   Vaping Use: Never used  Substance Use Topics   Alcohol use: Not Currently    Alcohol/week: 1.0 standard drink    Types: 1 Glasses of wine per week    Comment: ocassionally   Drug use: Never    Current Outpatient Medications  Medication Sig Dispense Refill   brexpiprazole (REXULTI) 2 MG TABS tablet Take 1 tablet (2 mg total) by mouth daily. 90 tablet 1   busPIRone (BUSPAR) 15 MG tablet TAKE 1 TABLET BY MOUTH 2 TIMES DAILY. 180 tablet 0   famotidine (PEPCID) 20 MG tablet TAKE 1 TABLET BY MOUTH EVERYDAY AT BEDTIME 30 tablet 0   hyoscyamine (LEVBID) 0.375 MG 12 hr tablet TAKE 1 TABLET (0.375 MG TOTAL) BY MOUTH 2 (TWO) TIMES DAILY. 60 tablet 6   lisdexamfetamine (VYVANSE) 70 MG capsule Take 1 capsule (70 mg total) by mouth daily. 30 capsule 0   lisdexamfetamine (VYVANSE) 70 MG capsule Take 1 capsule (70 mg total) by mouth daily. 30 capsule 0   methocarbamol (ROBAXIN) 500 MG tablet TAKE 1 TABLET BY MOUTH THREE TIMES A DAY AS NEEDED FOR 10 DAYS     Multiple Vitamins-Minerals (CENTRUM SILVER 50+WOMEN PO) Take by mouth.     ondansetron (ZOFRAN ODT) 4 MG disintegrating tablet Take 1 tablet (4 mg total) by mouth every 8 (eight) hours as needed for nausea or vomiting. 25 tablet 0   ondansetron (ZOFRAN) 4 MG  tablet Take 1 tablet (4 mg total) by mouth every 8 (eight) hours as needed for nausea or vomiting. 10 tablet 0   oxyCODONE-acetaminophen (PERCOCET/ROXICET) 5-325 MG tablet Take 1 tablet by mouth 2 (two) times daily as needed.     pantoprazole (PROTONIX) 40 MG tablet Take 1 tablet (40 mg total) by mouth 2 (two) times daily. Take half hour before breakfast and half hour before supper. 60 tablet 11   Pyridoxine HCl (VITAMIN B-6 PO) Take by mouth.     sertraline (ZOLOFT) 100 MG tablet Take 1 tablet (100 mg total) by mouth in the morning and at bedtime. 180 tablet 0   traZODone (DESYREL) 100 MG tablet Take 1/2-1 tablet po QHS prn insomnia 30 tablet 2   VITAMIN D PO Take by mouth.     Current Facility-Administered Medications  Medication Dose Route Frequency Provider Last Rate Last Admin   0.9 %  sodium chloride infusion  500 mL Intravenous Once [name], MD  No Known Allergies  Review of Systems:  neg     Physical Exam:    BP 122/78    Pulse 100    Ht 5\' 6"  (1.676 m)    Wt 261 lb 4 oz (118.5 kg)    SpO2 98%    BMI 42.17 kg/m  Wt Readings from Last 3 Encounters:  11/09/21 261 lb 4 oz (118.5 kg)  06/13/21 236 lb (107 kg)  05/21/21 236 lb (107 kg)   Constitutional:  Well-developed, in no acute distress. Psychiatric: Normal mood and affect. Behavior is normal. HEENT: Pupils normal.  Conjunctivae are normal. No scleral icterus. Cardiovascular: Normal rate, regular rhythm. No edema Pulmonary/chest: Effort normal and breath sounds normal. No wheezing, rales or rhonchi. Abdominal: Soft, nondistended. Nontender. Bowel sounds active throughout. There are no masses palpable. No hepatomegaly. Rectal: Deferred Neurological: Alert and oriented to person place and time. Skin: Skin is warm and dry. No rashes noted.  Data Reviewed: I have personally reviewed following labs and imaging studies  CBC: CBC Latest Ref Rng & Units 06/13/2021  WBC 4.0 - 10.5 K/uL 4.9  Hemoglobin 12.0 - 15.0  g/dL 14.7  Hematocrit 36.0 - 46.0 % 43.9  Platelets 150.0 - 400.0 K/uL 194.0    CMP: CMP Latest Ref Rng & Units 06/13/2021 04/26/2021  Glucose 70 - 99 mg/dL 95 -  BUN 6 - 23 mg/dL 20 -  Creatinine 0.40 - 1.20 mg/dL 0.83 -  Sodium 135 - 145 mEq/L 137 -  Potassium 3.5 - 5.1 mEq/L 4.4 -  Chloride 96 - 112 mEq/L 103 -  CO2 19 - 32 mEq/L 22 -  Calcium 8.4 - 10.5 mg/dL 9.7 -  Total Protein 6.0 - 8.3 g/dL 7.3 6.9  Total Bilirubin 0.2 - 1.2 mg/dL 0.6 0.4  Alkaline Phos 39 - 117 U/L 52 78  AST 0 - 37 U/L 23 23  ALT 0 - 35 U/L 21 26     Radiology Studies: No results found.    Carmell Austria, MD 11/09/2021, 10:49 AM  Cc: Bonnita Nasuti, MD

## 2021-11-12 ENCOUNTER — Encounter: Payer: Self-pay | Admitting: Gastroenterology

## 2021-11-12 DIAGNOSIS — R197 Diarrhea, unspecified: Secondary | ICD-10-CM

## 2021-11-13 ENCOUNTER — Other Ambulatory Visit: Payer: Self-pay

## 2021-11-13 DIAGNOSIS — R197 Diarrhea, unspecified: Secondary | ICD-10-CM

## 2021-11-13 MED ORDER — HYOSCYAMINE SULFATE ER 0.375 MG PO TB12: 0.3750 mg | ORAL_TABLET | Freq: Two times a day (BID) | ORAL | 6 refills | Status: DC

## 2021-11-13 NOTE — Telephone Encounter (Signed)
Pt notified of the date of last Colonoscopy 01/12/2021 Prescription sent to Pharmacy. Pt made aware. Pt verbalized understanding with all questions answered.

## 2021-11-15 DIAGNOSIS — N393 Stress incontinence (female) (male): Secondary | ICD-10-CM | POA: Diagnosis not present

## 2021-11-15 DIAGNOSIS — N3941 Urge incontinence: Secondary | ICD-10-CM | POA: Diagnosis not present

## 2021-11-15 DIAGNOSIS — N3281 Overactive bladder: Secondary | ICD-10-CM | POA: Diagnosis not present

## 2021-11-15 DIAGNOSIS — N812 Incomplete uterovaginal prolapse: Secondary | ICD-10-CM | POA: Diagnosis not present

## 2021-11-15 DIAGNOSIS — M5416 Radiculopathy, lumbar region: Secondary | ICD-10-CM | POA: Diagnosis not present

## 2021-11-16 ENCOUNTER — Ambulatory Visit (INDEPENDENT_AMBULATORY_CARE_PROVIDER_SITE_OTHER): Payer: BC Managed Care – PPO | Admitting: Psychology

## 2021-11-16 DIAGNOSIS — F331 Major depressive disorder, recurrent, moderate: Secondary | ICD-10-CM

## 2021-11-16 NOTE — Progress Notes (Signed)
[facility] Counselor/Therapist Progress Note  Patient ID: Jacqueline Fowler, MRN: [id number],    Date: 11/16/2021  Time Spent: 60 minutes  Treatment Type: Individual Therapy  Reported Symptoms: depression  Mental Status Exam: Appearance:  Casual     Behavior: Appropriate  Motor: Normal  Speech/Language:  Normal Rate  Affect: Blunt  Mood: depressed  Thought process: normal  Thought content:   WNL  Sensory/Perceptual disturbances:   WNL  Orientation: oriented to person, place, time/date, and situation  Attention: Good  Concentration: Good  Memory: WNL  Fund of knowledge:  Good  Insight:   Good  Judgment:  Good  Impulse Control: Good   Risk Assessment: Danger to Self:  No Self-injurious Behavior: No Danger to Others: No Duty to Warn:no Physical Aggression / Violence:No  Access to Firearms a concern: No  Gang Involvement:No   Subjective: The patient attended a face-to-face individual therapy session via video visit today.  The patient gave verbal consent for the session to be on video on WebEx.  The patient was in her home alone and the therapist was in the office.  The patient presents with a blunted affect and mood is sad.  The patient continues to talk about her health issues and is having difficulties in lots of areas of her health.  She talked about having bladder and uterine prolapse that she is likely to have to choose to have surgery to fix she also talked about having some growth in her mouth that is having to be checked out so we did some problem solving to try to help her figure out what she is going to do about the growth in her mouth because she has had 1 doctor say she has a growth and the other say she does not.  The patient also is struggling getting back on her diet.  We discussed modifying her diet and doing something in moderation as opposed to all or nothing.  In the next 2 weeks the patient is supposed to look at what she can do in moderation to help  herself just get back on track.  She eats often to soothe herself emotionally and we discussed this.  Interventions: Cognitive Behavioral Therapy  Diagnosis:Major depressive disorder, recurrent episode, moderate (HCC)  Plan: Please see Treatment plan in Therapy charts with target date of 05/01/2022 for goals and progress towards goals.  Will continue to see the patient at least biweekly and work with her using CBT, Insight oriented approach and EMDR.  Patient approved the treatment plan.  Aqil Goetting G Brighton Pilley, LCSW

## 2021-11-22 DIAGNOSIS — R198 Other specified symptoms and signs involving the digestive system and abdomen: Secondary | ICD-10-CM | POA: Diagnosis not present

## 2021-11-22 DIAGNOSIS — N812 Incomplete uterovaginal prolapse: Secondary | ICD-10-CM | POA: Diagnosis not present

## 2021-11-22 DIAGNOSIS — R102 Pelvic and perineal pain: Secondary | ICD-10-CM | POA: Diagnosis not present

## 2021-11-22 DIAGNOSIS — N816 Rectocele: Secondary | ICD-10-CM | POA: Diagnosis not present

## 2021-11-28 ENCOUNTER — Ambulatory Visit: Payer: BC Managed Care – PPO | Admitting: Psychiatry

## 2021-11-28 ENCOUNTER — Other Ambulatory Visit: Payer: Self-pay

## 2021-11-28 ENCOUNTER — Encounter: Payer: Self-pay | Admitting: Psychiatry

## 2021-11-28 DIAGNOSIS — F419 Anxiety disorder, unspecified: Secondary | ICD-10-CM | POA: Diagnosis not present

## 2021-11-28 DIAGNOSIS — F3342 Major depressive disorder, recurrent, in full remission: Secondary | ICD-10-CM | POA: Diagnosis not present

## 2021-11-28 MED ORDER — BUSPIRONE HCL 15 MG PO TABS: ORAL_TABLET | ORAL | 0 refills | Status: DC

## 2021-11-28 MED ORDER — PROPRANOLOL HCL 10 MG PO TABS: ORAL_TABLET | ORAL | 1 refills | Status: DC

## 2021-11-28 MED ORDER — BUSPIRONE HCL 30 MG PO TABS: 30.0000 mg | ORAL_TABLET | Freq: Two times a day (BID) | ORAL | 0 refills | Status: DC

## 2021-11-28 MED ORDER — SERTRALINE HCL 100 MG PO TABS: 100.0000 mg | ORAL_TABLET | Freq: Two times a day (BID) | ORAL | 1 refills | Status: DC

## 2021-11-28 MED ORDER — LORAZEPAM 0.5 MG PO TABS
0.5000 mg | ORAL_TABLET | Freq: Every day | ORAL | 0 refills | Status: DC | PRN
Start: 2021-11-28 — End: 2022-01-04

## 2021-11-28 MED ORDER — BREXPIPRAZOLE 2 MG PO TABS: 2.0000 mg | ORAL_TABLET | Freq: Every day | ORAL | 1 refills | Status: DC

## 2021-11-28 NOTE — Progress Notes (Signed)
Jacqueline Fowler [id number] 1971/11/02 51 y.o.  Subjective:   Patient ID:  Jacqueline Fowler is a 51 y.o. (DOB August 27, 1971) female.  Chief Complaint:  Chief Complaint  Patient presents with   Anxiety   Depression    HPI Jacqueline Fowler presents to the office today for follow-up of anxiety, depression, and binge eating.   She reports that she has been having some health issues. She has bladder and uterine prolapse and will have a partial hysterectomy and bladder tack in March. She reports that she has had some pain and discomfort from this. Has had back pain. She noticed some possible swollen lymph nodes and saw dermatologist who referred her to an oral surgeon who then referred her to ENT who did not find anything. She has just scheduled another ENT second opinion. She has unexplained arm swelling. She reports that these health issues have caused her anxiety.   She had a panic attack a couple of nights ago. She reports that this was the first panic attack she has had in awhile. Increased anxiety and worry. She reports that her chest will hurt with anxiety. She reports that her HR has been elevated.   She reports, "I generally don't feel good." She reports some situational sadness. Feelings of frustration. Energy and motivation have been lower. She reports that she has not been taking Vyvanse regularly. She reports that she is not binge eating. She reports, "I feel like I am eating my feelings." She reports that she does not watch TV since it is painful for her to be on the couch. Sleep has been fair. Has not been able to concentrate on work or "have the energy for it." Denies SI.   She continues to see [employer], LCSW.   Past Psychiatric Medication Trials: Valium Sertraline Prozac Wellbutrin-adverse reaction Rexulti Abilify- Weight gain Trazodone [place] Office Visit from 11/28/2021 in [place] Office Visit from 07/31/2021 in [place] Visit from 03/29/2021 in [place] Total Score 0 0 0        Review of Systems:  Review of Systems  Gastrointestinal:  Positive for vomiting.       Rectocele. Acid reflux  Genitourinary:  Positive for urgency.       Bladder and uterine prolapse  Musculoskeletal:  Positive for back pain. Negative for gait problem.  Neurological:  Positive for headaches.  Psychiatric/Behavioral:         Please refer to HPI   Medications: I have reviewed the patient's current medications.  Current Outpatient Medications  Medication Sig Dispense Refill   busPIRone (BUSPAR) 30 MG tablet Take 1 tablet (30 mg total) by mouth 2 (two) times daily. 180 tablet 0   famotidine (PEPCID) 20 MG tablet TAKE 1 TABLET BY MOUTH EVERYDAY AT BEDTIME 30 tablet 0   hyoscyamine (LEVBID) 0.375 MG 12 hr tablet Take 1 tablet (0.375 mg total) by mouth 2 (two) times daily. 60 tablet 6   lisdexamfetamine (VYVANSE) 70 MG capsule Take 1 capsule (70 mg total) by mouth daily. 30 capsule 0   LORazepam (ATIVAN) 0.5 MG tablet Take 1 tablet (0.5 mg total) by mouth daily as needed for anxiety (panic attack). 15 tablet 0   methocarbamol (ROBAXIN) 500 MG tablet TAKE 1 TABLET BY MOUTH THREE TIMES A DAY AS NEEDED FOR 10 DAYS     Multiple Vitamins-Minerals (CENTRUM SILVER 50+WOMEN PO) Take by mouth.     ondansetron (  ZOFRAN ODT) 4 MG disintegrating tablet Take 1 tablet (4 mg total) by mouth every 8 (eight) hours as needed for nausea or vomiting. 25 tablet 0   pantoprazole (PROTONIX) 40 MG tablet Take 1 tablet (40 mg total) by mouth 2 (two) times daily. Take half hour before breakfast and half hour before supper. 60 tablet 11   propranolol (INDERAL) 10 MG tablet Take 1-2 tabs po BID prn anxiety 120 tablet 1   Pyridoxine HCl (VITAMIN B-6 PO) Take by mouth.     sucralfate (CARAFATE) 1 g tablet Take 1 tablet (1 g total) by mouth QID. For 4 weeks 120 tablet 1   sucralfate (CARAFATE) 1 GM/10ML suspension Take 10 mLs  (1 g total) by mouth 4 (four) times daily. Take for 4 weeks 600 mL 1   traZODone (DESYREL) 100 MG tablet Take 1/2-1 tablet po QHS prn insomnia 30 tablet 2   VITAMIN D PO Take by mouth.     AMBULATORY NON FORMULARY MEDICATION Medication Name: GI Cocktail Equal parts of bentyl, 2% viscous lidocaine and Maalox Take 58ml every 4-6 hours as needed 240 mL 2   brexpiprazole (REXULTI) 2 MG TABS tablet Take 1 tablet (2 mg total) by mouth daily. 90 tablet 1   busPIRone (BUSPAR) 15 MG tablet Take 1.5 tabs twice daily for one week, then increase to 2 tabs twice daily 180 tablet 0   sertraline (ZOLOFT) 100 MG tablet Take 1 tablet (100 mg total) by mouth in the morning and at bedtime. 180 tablet 1   Current Facility-Administered Medications  Medication Dose Route Frequency Provider Last Rate Last Admin   0.9 %  sodium chloride infusion  500 mL Intravenous Once [name], MD        Medication Side Effects: None  Allergies: No Known Allergies  Past Medical History:  Diagnosis Date   Anxiety    Depression    Gallstones 2004   GERD (gastroesophageal reflux disease)    IBS (irritable bowel syndrome)    Obesity    UTI (urinary tract infection)     Past Medical History, Surgical history, Social history, and Family history were reviewed and updated as appropriate.   Please see review of systems for further details on the patient's review from today.   Objective:   Physical Exam:  BP (!) 139/91    Pulse 98   Physical Exam Constitutional:      General: She is not in acute distress. Musculoskeletal:        General: No deformity.  Neurological:     Mental Status: She is alert and oriented to person, place, and time.     Coordination: Coordination normal.  Psychiatric:        Attention and Perception: Attention and perception normal. She does not perceive auditory or visual hallucinations.        Mood and Affect: Mood is anxious and depressed. Affect is not labile, blunt, angry or  inappropriate.        Speech: Speech normal.        Behavior: Behavior normal.        Thought Content: Thought content normal. Thought content is not paranoid or delusional. Thought content does not include homicidal or suicidal ideation. Thought content does not include homicidal or suicidal plan.        Cognition and Memory: Cognition and memory normal.        Judgment: Judgment normal.     Comments: Insight intact    Lab Review:  Component Value Date/Time   NA 137 06/13/2021 0832   K 4.4 06/13/2021 0832   CL 103 06/13/2021 0832   CO2 22 06/13/2021 0832   GLUCOSE 95 06/13/2021 0832   BUN 20 06/13/2021 0832   CREATININE 0.83 06/13/2021 0832   CALCIUM 9.7 06/13/2021 0832   PROT 7.3 06/13/2021 0832   ALBUMIN 4.4 06/13/2021 0832   AST 23 06/13/2021 0832   ALT 21 06/13/2021 0832   ALKPHOS 52 06/13/2021 0832   BILITOT 0.6 06/13/2021 0832       Component Value Date/Time   WBC 4.9 06/13/2021 0832   RBC 5.25 (H) 06/13/2021 0832   HGB 14.7 06/13/2021 0832   HCT 43.9 06/13/2021 0832   PLT 194.0 06/13/2021 0832   MCV 83.6 06/13/2021 0832   MCHC 33.5 06/13/2021 0832   RDW 14.5 06/13/2021 0832   LYMPHSABS 1.1 06/13/2021 0832   MONOABS 0.3 06/13/2021 0832   EOSABS 0.0 06/13/2021 0832   BASOSABS 0.0 06/13/2021 0832    No results found for: POCLITH, LITHIUM   No results found for: PHENYTOIN, PHENOBARB, VALPROATE, CBMZ   .res Assessment: Plan:   Discussed potential benefits, risks, and side effects of increasing Buspar to improve insomnia. Pt agrees to increase in Buspar. Will increase Buspar to 15 mg 1.5 tabs po BID for one week, then 30 mg po BID for anxiety.  Discussed holding Vyvanse until HR and BP improve. Discussed that increased HR and BP may be related to increase in anxiety, particularly since she reports that her HR and BP had been well controlled while taking Vyvanse until acute medical stressors started.  Discussed potential benefits, risks, and side effects of  Propranolol. Discussed that Propranolol can be used as needed for anxiety signs and symptoms. Patient agrees to trial of Propranolol. Will start Propranolol 10 mg 1-2 tabs po BID prn anxiety.  Will start Lorazepam 0.5 mg po qd prn panic since she is no longer able to take Diazepam due to liver disease. Discussed that Lorazepam is less extensively metabolized by the liver and would be the safest option to use on rare occasions that she experiences a panic attack.  Continue Sertraline 100 mg po BID for anxiety and depression.  Continue Rexulti 2 mg po qd for augmentation of depression.  Continue trazodone 100 mg 1/2-1 tab po QHS prn insomnia.  Recommend continuing therapy with Bambi Cottle, LCSW.  Pt to follow-up in 4 months or sooner if clinically indicated.  Patient advised to contact office with any questions, adverse effects, or acute worsening in signs and symptoms.  Rhema was seen today for anxiety and depression.  Diagnoses and all orders for this visit:  Anxiety disorder, unspecified type -     LORazepam (ATIVAN) 0.5 MG tablet; Take 1 tablet (0.5 mg total) by mouth daily as needed for anxiety (panic attack). -     busPIRone (BUSPAR) 15 MG tablet; Take 1.5 tabs twice daily for one week, then increase to 2 tabs twice daily -     busPIRone (BUSPAR) 30 MG tablet; Take 1 tablet (30 mg total) by mouth 2 (two) times daily. -     sertraline (ZOLOFT) 100 MG tablet; Take 1 tablet (100 mg total) by mouth in the morning and at bedtime. -     propranolol (INDERAL) 10 MG tablet; Take 1-2 tabs po BID prn anxiety  Recurrent major depressive disorder, in full remission (HCC) -     sertraline (ZOLOFT) 100 MG tablet; Take 1 tablet (100 mg total) by  mouth in the morning and at bedtime.     Please see After Visit Summary for patient specific instructions.  Future Appointments  Date Time Provider [place]  11/30/2021 10:00 AM Cottle, Lucious Groves, LCSW [facility] None  12/14/2021 10:00 AM Cottle, Bambi  G, LCSW [facility] None  12/28/2021 10:00 AM Cottle, Bambi G, LCSW [facility] None  03/27/2022 11:30 AM Thayer Headings, PMHNP [facility] None    No orders of the defined types were placed in this encounter.   -------------------------------

## 2021-11-30 ENCOUNTER — Ambulatory Visit (INDEPENDENT_AMBULATORY_CARE_PROVIDER_SITE_OTHER): Payer: BC Managed Care – PPO | Admitting: Psychology

## 2021-11-30 DIAGNOSIS — F5081 Binge eating disorder: Secondary | ICD-10-CM | POA: Diagnosis not present

## 2021-11-30 DIAGNOSIS — F331 Major depressive disorder, recurrent, moderate: Secondary | ICD-10-CM

## 2021-11-30 DIAGNOSIS — F419 Anxiety disorder, unspecified: Secondary | ICD-10-CM

## 2021-11-30 NOTE — Progress Notes (Signed)
[facility] Counselor/Therapist Progress Note  Patient ID: SKYLEN DANIELSEN, MRN: [id number],    Date: 11/30/2021  Time Spent: 60 minutes  Treatment Type: Individual Therapy  Reported Symptoms: depression  Mental Status Exam: Appearance:  Casual     Behavior: Appropriate  Motor: Normal  Speech/Language:  Normal Rate  Affect: Blunt  Mood: depressed  Thought process: normal  Thought content:   WNL  Sensory/Perceptual disturbances:   WNL  Orientation: oriented to person, place, time/date, and situation  Attention: Good  Concentration: Good  Memory: WNL  Fund of knowledge:  Good  Insight:   Good  Judgment:  Good  Impulse Control: Good   Risk Assessment: Danger to Self:  No Self-injurious Behavior: No Danger to Others: No Duty to Warn:no Physical Aggression / Violence:No  Access to Firearms a concern: No  Gang Involvement:No   Subjective: The patient attended a face-to-face individual therapy session via video visit today.  The patient gave verbal consent for the session to be on video on WebEx.  The patient was in her home alone and the therapist was in the office.  The patient presents as very depressed today.  The patient reports that she has been struggling with a lot of health concerns lately.  The patient says that she cannot have surgery to tack up her bladder and uterus until March 14.  We discussed ways for her to help herself relax her body more.  The patient states that Thayer Headings her medication provider, put her on Ativan and Inderal for her anxiety as she was having some anxiety attacks.  We talked about some behavioral techniques for her to try such as guided imagery in the morning and in the evening and also I recommended that she take baths if possible.  In addition we talked about how to change her negative thinking and I helped her look at the thinking that causes her to be more depressed about the situation.  We will continue to encourage patient  to utilize coping strategies. Interventions: Cognitive Behavioral Therapy  Diagnosis:Major depressive disorder, recurrent episode, moderate (HCC)  Binge eating disorder  Anxiety disorder, unspecified type  Plan: Please see Treatment plan in Therapy charts with target date of 05/01/2022 for goals and progress towards goals.  Will continue to see the patient at least biweekly and work with her using CBT, Insight oriented approach and EMDR.  Patient approved the treatment plan.  Edgard Debord G Lessie Manigo, LCSW                  Tyronica Truxillo G Toriana Sponsel, LCSW

## 2021-12-06 DIAGNOSIS — M5136 Other intervertebral disc degeneration, lumbar region: Secondary | ICD-10-CM | POA: Diagnosis not present

## 2021-12-06 DIAGNOSIS — M5451 Vertebrogenic low back pain: Secondary | ICD-10-CM | POA: Diagnosis not present

## 2021-12-10 DIAGNOSIS — M6281 Muscle weakness (generalized): Secondary | ICD-10-CM | POA: Diagnosis not present

## 2021-12-10 DIAGNOSIS — M545 Low back pain, unspecified: Secondary | ICD-10-CM | POA: Diagnosis not present

## 2021-12-12 DIAGNOSIS — K1370 Unspecified lesions of oral mucosa: Secondary | ICD-10-CM | POA: Diagnosis not present

## 2021-12-14 ENCOUNTER — Ambulatory Visit (INDEPENDENT_AMBULATORY_CARE_PROVIDER_SITE_OTHER): Payer: BC Managed Care – PPO | Admitting: Psychology

## 2021-12-14 DIAGNOSIS — F331 Major depressive disorder, recurrent, moderate: Secondary | ICD-10-CM

## 2021-12-14 NOTE — Progress Notes (Signed)
[place] Counselor/Therapist Progress Note  Patient ID: Jacqueline Fowler, MRN: [id number],    Date: 12/14/2021  Time Spent: 60 minutes  Treatment Type: Individual Therapy  Reported Symptoms: depression  Mental Status Exam: Appearance:  Casual     Behavior: Appropriate  Motor: Normal  Speech/Language:  Normal Rate  Affect: Blunt  Mood: depressed  Thought process: normal  Thought content:   WNL  Sensory/Perceptual disturbances:   WNL  Orientation: oriented to person, place, time/date, and situation  Attention: Good  Concentration: Good  Memory: WNL  Fund of knowledge:  Good  Insight:   Good  Judgment:  Good  Impulse Control: Good   Risk Assessment: Danger to Self:  No Self-injurious Behavior: No Danger to Others: No Duty to Warn:no Physical Aggression / Violence:No  Access to Firearms a concern: No  Gang Involvement:No   Subjective: The patient attended a face-to-face individual therapy session via video visit today.  The patient gave verbal consent for the session to be on video on WebEx.  The patient was in her home alone and the therapist was in the office.  The patient patient presents with a blunted affect and mood is pleasant.  The patient reports that she is still struggling very much with her physical health and her back and the prolapse that she is experiencing.  She states that she did go to the ENT and found out that she would does not have any issues with a growth which made her feel much better.  We did some problem solving today on how to deal with some of the pain issues that she has.  The patient felt that what we did the last time and talked about a bath and that was very helpful and she has not been able to get herself back into meditating and mindfulness just yet .I did recommend that she look up EFT for pain relief.  Interventions: Cognitive Behavioral Therapy  Diagnosis:Major depressive disorder, recurrent episode, moderate (HCC)  Plan:  Please see Treatment plan in Therapy charts with target date of 05/01/2022 for goals and progress towards goals.  Will continue to see the patient at least biweekly and work with her using CBT, Insight oriented approach and EMDR.  Patient approved the treatment plan.  Deandrew Hoecker G Lola Lofaro, LCSW                  Adolpho Meenach G Veleta Yamamoto, LCSW               Kae Lauman G Evadean Sproule, LCSW

## 2021-12-20 ENCOUNTER — Other Ambulatory Visit: Payer: Self-pay | Admitting: Psychiatry

## 2021-12-20 DIAGNOSIS — F419 Anxiety disorder, unspecified: Secondary | ICD-10-CM

## 2021-12-27 ENCOUNTER — Inpatient Hospital Stay
Admission: RE | Admit: 2021-12-27 | Discharge: 2021-12-27 | Disposition: A | Discharge status: Home / Self Care | Payer: Self-pay | Source: Ambulatory Visit | Attending: *Deleted | Admitting: *Deleted

## 2021-12-27 ENCOUNTER — Other Ambulatory Visit: Payer: Self-pay | Admitting: *Deleted

## 2021-12-27 DIAGNOSIS — Z1231 Encounter for screening mammogram for malignant neoplasm of breast: Secondary | ICD-10-CM

## 2021-12-28 ENCOUNTER — Ambulatory Visit (INDEPENDENT_AMBULATORY_CARE_PROVIDER_SITE_OTHER): Payer: BC Managed Care – PPO | Admitting: Psychology

## 2021-12-28 DIAGNOSIS — F331 Major depressive disorder, recurrent, moderate: Secondary | ICD-10-CM

## 2021-12-28 DIAGNOSIS — F419 Anxiety disorder, unspecified: Secondary | ICD-10-CM | POA: Diagnosis not present

## 2021-12-28 NOTE — Progress Notes (Signed)
[facility] Counselor/Therapist Progress Note  Patient ID: Jacqueline Fowler, MRN: [id number],    Date: 12/28/2021  Time Spent: 60 minutes  Treatment Type: Individual Therapy  Reported Symptoms: depression  Mental Status Exam: Appearance:  Casual     Behavior: Appropriate  Motor: Normal  Speech/Language:  Normal Rate  Affect: Blunt  Mood: depressed  Thought process: normal  Thought content:   WNL  Sensory/Perceptual disturbances:   WNL  Orientation: oriented to person, place, time/date, and situation  Attention: Good  Concentration: Good  Memory: WNL  Fund of knowledge:  Good  Insight:   Good  Judgment:  Good  Impulse Control: Good   Risk Assessment: Danger to Self:  No Self-injurious Behavior: No Danger to Others: No Duty to Warn:no Physical Aggression / Violence:No  Access to Firearms a concern: No  Gang Involvement:No   Subjective: The patient attended a face-to-face individual therapy session via video visit today.  The patient gave verbal consent for the session to be on video on WebEx.  The patient was in her home alone and the therapist was in the office.  The patient patient presents with a blunted affect and mood is pleasant.  The patient reports that she is still in a great deal of physical pain.  Her surgery is scheduled for March 14.  We talked about the importance of trying to keep herself calm and that being something that could help her with her pain level.  The patient stated that she had some anxiety attacks and had to use her medications to help calm herself down.  I explained to her how meditation and mindfulness keeps her steady on a regular basis and how chemicals work in her brain.  I recommended that she try to implement some of her mindfulness and meditation at home on a daily basis so that she can maintain her stability until she has her her surgery.  She stated that she understood the concepts we discussed and plans to implement some of the  strategies.  Interventions: Cognitive Behavioral Therapy  Diagnosis:Major depressive disorder, recurrent episode, moderate (HCC)  Anxiety disorder, unspecified type  Plan: Please see Treatment plan in Therapy charts with target date of 05/01/2022 for goals and progress towards goals.  Will continue to see the patient at least biweekly and work with her using CBT, Insight oriented approach and EMDR.  Patient approved the treatment plan.  Kyrra Prada G Dream Harman, LCSW                  Dyllan Hughett G Skyeler Scalese, LCSW               Tristan Bramble G Daviana Haymaker, LCSW               Daylynn Stumpp G Zahki Hoogendoorn, LCSW

## 2022-01-04 ENCOUNTER — Other Ambulatory Visit: Payer: Self-pay | Admitting: Psychiatry

## 2022-01-04 DIAGNOSIS — F419 Anxiety disorder, unspecified: Secondary | ICD-10-CM

## 2022-01-07 DIAGNOSIS — N812 Incomplete uterovaginal prolapse: Secondary | ICD-10-CM | POA: Diagnosis not present

## 2022-01-10 DIAGNOSIS — R3129 Other microscopic hematuria: Secondary | ICD-10-CM | POA: Diagnosis not present

## 2022-01-10 DIAGNOSIS — R350 Frequency of micturition: Secondary | ICD-10-CM | POA: Diagnosis not present

## 2022-01-10 DIAGNOSIS — R3915 Urgency of urination: Secondary | ICD-10-CM | POA: Diagnosis not present

## 2022-01-10 DIAGNOSIS — R8290 Unspecified abnormal findings in urine: Secondary | ICD-10-CM | POA: Diagnosis not present

## 2022-01-10 DIAGNOSIS — N393 Stress incontinence (female) (male): Secondary | ICD-10-CM | POA: Diagnosis not present

## 2022-01-10 DIAGNOSIS — N812 Incomplete uterovaginal prolapse: Secondary | ICD-10-CM | POA: Diagnosis not present

## 2022-01-11 ENCOUNTER — Ambulatory Visit (INDEPENDENT_AMBULATORY_CARE_PROVIDER_SITE_OTHER): Payer: BC Managed Care – PPO | Admitting: Psychology

## 2022-01-11 ENCOUNTER — Telehealth: Payer: Self-pay | Admitting: Psychiatry

## 2022-01-11 DIAGNOSIS — F419 Anxiety disorder, unspecified: Secondary | ICD-10-CM

## 2022-01-11 DIAGNOSIS — F5081 Binge eating disorder: Secondary | ICD-10-CM

## 2022-01-11 DIAGNOSIS — F331 Major depressive disorder, recurrent, moderate: Secondary | ICD-10-CM | POA: Diagnosis not present

## 2022-01-11 NOTE — Telephone Encounter (Signed)
Pt called and said that she wants to talk to only [place] about a medication. Please have Janett Billow call her at [phone]. Pt is aware that it will be Monday before it is seen ?

## 2022-01-11 NOTE — Telephone Encounter (Signed)
Please review

## 2022-01-13 NOTE — Progress Notes (Signed)
[facility] Counselor/Therapist Progress Note ? ?Patient ID: Jacqueline Fowler, MRN: [id number],   ? ?Date: 01/13/2022 ? ?Time Spent: 60 minutes ? ?Treatment Type: Individual Therapy ? ?Reported Symptoms: depression ? ?Mental Status Exam: ?Appearance:  Casual     ?Behavior: Appropriate  ?Motor: Normal  ?Speech/Language:  Normal Rate  ?Affect: Blunt  ?Mood: depressed  ?Thought process: normal  ?Thought content:   WNL  ?Sensory/Perceptual disturbances:   WNL  ?Orientation: oriented to person, place, time/date, and situation  ?Attention: Good  ?Concentration: Good  ?Memory: WNL  ?Fund of knowledge:  Good  ?Insight:   Good  ?Judgment:  Good  ?Impulse Control: Good  ? ?Risk Assessment: ?Danger to Self:  No ?Self-injurious Behavior: No ?Danger to Others: No ?Duty to Warn:no ?Physical Aggression / Violence:No  ?Access to Firearms a concern: No  ?Gang Involvement:No  ? ?Subjective: The patient attended a face-to-face individual therapy session via video visit today.  The patient gave verbal consent for the session to be on video on WebEx.  The patient was in her home alone and the therapist was in the office.  The patient patient presents with a blunted affect and mood is pleasant.  The patient reports that she is continuing to be anxious about her upcoming surgery.  The patient states that she is afraid that she is going to have a panic attack.  I recommended that she speak with her medication provider through my chart.  The patient talked about her concerns with her husband.  I tried to help patient understand that there is no way that he is going to see things in the relationship like she sees them.  We talked about all of the good things that he does for her.  We talked about his love language being Acts of service.  The patient expressed understanding of the concepts discussed.   ? ?Interventions: Cognitive Behavioral Therapy ? ?Diagnosis:Major depressive disorder, recurrent episode, moderate ([place]) ? ?Anxiety  disorder, unspecified type ? ?Binge eating disorder ? ?Plan: Please see Treatment plan in Therapy charts with target date of 05/01/2022 for goals and progress towards goals.  Will continue to see the patient at least biweekly and work with her using CBT, Insight oriented approach and EMDR. ? ?Patient approved the treatment plan. ? ?Hillary Schwegler G Carolyn Maniscalco, LCSW ? ? ? ? ? ? ? ? ? ? ? ? ? ? ? ? ? ?Hymen Arnett G Elia Keenum, LCSW ? ? ? ? ? ? ? ? ? ? ? ? ? ? ?Sola Margolis G Lanita Stammen, LCSW ? ? ? ? ? ? ? ? ? ? ? ? ? ? ?Teran Knittle G Jaret Coppedge, LCSW ? ? ? ? ? ? ? ? ? ? ? ? ? ? ?Tasha Diaz G Stepahnie Campo, LCSW ?

## 2022-01-14 ENCOUNTER — Other Ambulatory Visit: Payer: Self-pay | Admitting: Psychiatry

## 2022-01-14 DIAGNOSIS — F419 Anxiety disorder, unspecified: Secondary | ICD-10-CM

## 2022-01-14 MED ORDER — LORAZEPAM 0.5 MG PO TABS: 0.5000 mg | ORAL_TABLET | Freq: Every day | ORAL | 1 refills | Status: DC | PRN

## 2022-01-14 NOTE — Telephone Encounter (Signed)
L/M for pt that provider was returning her call and would initiate a My Chart message that she could reply to if needed. Discussed that otherwise provider would attempt to reach her again soon.  ?

## 2022-01-14 NOTE — Telephone Encounter (Signed)
She reports that she has been working with therapist to manage panic attacks. She reports that her surgery is scheduled a week for tomorrow and is having anxiety with surgery. She asks if she can have a refill of Ativan since having it would help her anxiety. She reports increase in Buspar seems to be helping with her anxiety. She reports that her depression is mainly in response to physical s/s.  ? ?Script for Lorazepam prn sent to her pharmacy.  ? ?Patient advised to contact office with any questions, adverse effects, or acute worsening in signs and symptoms. ? ?

## 2022-01-14 NOTE — Addendum Note (Signed)
Addended by: Sharyl Nimrod on: 01/14/2022 04:53 PM ? ? Modules accepted: Orders ? ?

## 2022-01-15 DIAGNOSIS — Z01810 Encounter for preprocedural cardiovascular examination: Secondary | ICD-10-CM | POA: Diagnosis not present

## 2022-01-15 DIAGNOSIS — Z01812 Encounter for preprocedural laboratory examination: Secondary | ICD-10-CM | POA: Diagnosis not present

## 2022-01-15 DIAGNOSIS — N812 Incomplete uterovaginal prolapse: Secondary | ICD-10-CM | POA: Diagnosis not present

## 2022-01-17 ENCOUNTER — Other Ambulatory Visit: Payer: Self-pay | Admitting: Psychiatry

## 2022-01-17 DIAGNOSIS — F419 Anxiety disorder, unspecified: Secondary | ICD-10-CM

## 2022-01-22 DIAGNOSIS — N888 Other specified noninflammatory disorders of cervix uteri: Secondary | ICD-10-CM | POA: Diagnosis not present

## 2022-01-22 DIAGNOSIS — N812 Incomplete uterovaginal prolapse: Secondary | ICD-10-CM | POA: Diagnosis not present

## 2022-01-22 DIAGNOSIS — N884 Hypertrophic elongation of cervix uteri: Secondary | ICD-10-CM | POA: Diagnosis not present

## 2022-01-22 DIAGNOSIS — N393 Stress incontinence (female) (male): Secondary | ICD-10-CM | POA: Diagnosis not present

## 2022-01-25 ENCOUNTER — Ambulatory Visit: Payer: BC Managed Care – PPO | Admitting: Psychology

## 2022-02-04 DIAGNOSIS — R102 Pelvic and perineal pain: Secondary | ICD-10-CM | POA: Diagnosis not present

## 2022-02-04 DIAGNOSIS — N393 Stress incontinence (female) (male): Secondary | ICD-10-CM | POA: Diagnosis not present

## 2022-02-04 DIAGNOSIS — N812 Incomplete uterovaginal prolapse: Secondary | ICD-10-CM | POA: Diagnosis not present

## 2022-02-04 DIAGNOSIS — N816 Rectocele: Secondary | ICD-10-CM | POA: Diagnosis not present

## 2022-02-08 ENCOUNTER — Ambulatory Visit (INDEPENDENT_AMBULATORY_CARE_PROVIDER_SITE_OTHER): Payer: BC Managed Care – PPO | Admitting: Psychology

## 2022-02-08 DIAGNOSIS — F419 Anxiety disorder, unspecified: Secondary | ICD-10-CM | POA: Diagnosis not present

## 2022-02-08 DIAGNOSIS — F331 Major depressive disorder, recurrent, moderate: Secondary | ICD-10-CM | POA: Diagnosis not present

## 2022-02-08 NOTE — Progress Notes (Signed)
[facility] Counselor/Therapist Progress Note ? ?Patient ID: Jacqueline Fowler, MRN: [id number],   ? ?Date: 02/08/2022 ? ?Time Spent: 60 minutes ? ?Treatment Type: Individual Therapy ? ?Reported Symptoms: depression ? ?Mental Status Exam: ?Appearance:  Casual     ?Behavior: Appropriate  ?Motor: Normal  ?Speech/Language:  Normal Rate  ?Affect: Blunt  ?Mood: depressed  ?Thought process: normal  ?Thought content:   WNL  ?Sensory/Perceptual disturbances:   WNL  ?Orientation: oriented to person, place, time/date, and situation  ?Attention: Good  ?Concentration: Good  ?Memory: WNL  ?Fund of knowledge:  Good  ?Insight:   Good  ?Judgment:  Good  ?Impulse Control: Good  ? ?Risk Assessment: ?Danger to Self:  No ?Self-injurious Behavior: No ?Danger to Others: No ?Duty to Warn:no ?Physical Aggression / Violence:No  ?Access to Firearms a concern: No  ?Gang Involvement:No  ? ?Subjective: The patient attended a face-to-face individual therapy session via video visit today.  The patient gave verbal consent for the session to be on video on WebEx.  The patient was in her home alone and the therapist was in the office.  The patient presents with a blunted affect and her mood is depressed.  The patient went through her surgery and is doing better.  She states that she has been down lately and it appears that she has gotten back into ruminating on negative things.  She states that she wrote down all the things that have gone on this year that have been stressful and became very very depressed because of that.  I recommended that she think about what she is doing and while there have been several things that she has had to deal with that it would be better that she focus on what she did survive and how she overcame the odds.  I explained how the chemicals work in her brain again and recommended that she change her wording and also focus on the things that are beneficial.  I recommended that she get back into meditation and  mindfulness on a regular basis and that she focus on journaling in a gratitude journal. ? ?Interventions: Cognitive Behavioral Therapy ? ?Diagnosis:Anxiety disorder, unspecified type ? ?Major depressive disorder, recurrent episode, moderate ([place]) ? ?Plan: Please see Treatment plan in Therapy charts with target date of 05/01/2022 for goals and progress towards goals.  Will continue to see the patient at least biweekly and work with her using CBT, Insight oriented approach and EMDR. ? ?Patient approved the treatment plan. ? ?Wesam Gearhart G Phyillis Dascoli, LCSW ? ? ? ? ? ? ? ? ? ? ? ? ? ? ? ? ? ?Khadeem Rockett G Marcelles Clinard, LCSW ? ? ? ? ? ? ? ? ? ? ? ? ? ? ?Macklen Wilhoite G Zarianna Dicarlo, LCSW ? ? ? ? ? ? ? ? ? ? ? ? ? ? ?Ameliyah Sarno G Keath Matera, LCSW ? ? ? ? ? ? ? ? ? ? ? ? ? ? ?Adalie Mand G Rheda Kassab, LCSW ? ? ? ? ? ? ? ? ? ? ? ? ? ? ?Bilal Manzer G Milcah Dulany, LCSW ?

## 2022-02-14 ENCOUNTER — Other Ambulatory Visit: Payer: Self-pay | Admitting: Psychiatry

## 2022-02-14 DIAGNOSIS — F419 Anxiety disorder, unspecified: Secondary | ICD-10-CM

## 2022-02-20 ENCOUNTER — Other Ambulatory Visit: Payer: Self-pay | Admitting: Psychiatry

## 2022-02-20 DIAGNOSIS — F419 Anxiety disorder, unspecified: Secondary | ICD-10-CM

## 2022-02-22 ENCOUNTER — Ambulatory Visit (INDEPENDENT_AMBULATORY_CARE_PROVIDER_SITE_OTHER): Payer: BC Managed Care – PPO | Admitting: Psychology

## 2022-02-22 DIAGNOSIS — F419 Anxiety disorder, unspecified: Secondary | ICD-10-CM | POA: Diagnosis not present

## 2022-02-22 DIAGNOSIS — F331 Major depressive disorder, recurrent, moderate: Secondary | ICD-10-CM | POA: Diagnosis not present

## 2022-02-22 DIAGNOSIS — F5081 Binge eating disorder: Secondary | ICD-10-CM

## 2022-02-22 NOTE — Progress Notes (Signed)
[facility] Counselor/Therapist Progress Note ? ?Patient ID: Jacqueline Fowler, MRN: [id number],   ? ?Date: 02/22/2022 ? ?Time Spent: 60 minutes ? ?Treatment Type: Individual Therapy ? ?Reported Symptoms: depression ? ?Mental Status Exam: ?Appearance:  Casual     ?Behavior: Appropriate  ?Motor: Normal  ?Speech/Language:  Normal Rate  ?Affect: Blunt  ?Mood: depressed  ?Thought process: normal  ?Thought content:   WNL  ?Sensory/Perceptual disturbances:   WNL  ?Orientation: oriented to person, place, time/date, and situation  ?Attention: Good  ?Concentration: Good  ?Memory: WNL  ?Fund of knowledge:  Good  ?Insight:   Good  ?Judgment:  Good  ?Impulse Control: Good  ? ?Risk Assessment: ?Danger to Self:  No ?Self-injurious Behavior: No ?Danger to Others: No ?Duty to Warn:no ?Physical Aggression / Violence:No  ?Access to Firearms a concern: No  ?Gang Involvement:No  ? ?Subjective: The patient attended a face-to-face individual therapy session via video visit today.  The patient gave verbal consent for the session to be on video on WebEx.  The patient was in her home alone and the therapist was in the office.  The patient presents with a blunted affect and her mood is depressed.  The patient talked about not having any close to where to go to a ballgame that her granddaughter is in.  We talked about her possibly using that as an excuse not to get out of the house and to continue to stay depressed.  We did some problem solving to help her understand that she could figure out a way to move forward and reframe things for herself.  The patient tends to get really stuck in negative thinking and even wrote down all the things that she does not like about herself in the last few weeks.  I recommended that she not do that anymore and encouraged her to find a way to go to her granddaughter's soccer game.  In addition we talked about how to keep herself from going down the negative trajectory of everything is terrible and  awful.  I gave her some skills to utilize to get her mind switched when she is headed down that path.  The patient also talked about having a smell that came to her and we talked about this potentially being an olfactory hallucination or being related to a medication she is on.  I encouraged her to speak with her medication provider about this when she sees her the next time or if it gets worse to call them. ? ?Interventions: Cognitive Behavioral Therapy ? ?Diagnosis:Anxiety disorder, unspecified type ? ?Major depressive disorder, recurrent episode, moderate ([place]) ? ?Binge eating disorder ? ?Plan: Please see Treatment plan in Therapy charts with target date of 05/01/2022 for goals and progress towards goals.  Will continue to see the patient at least biweekly and work with her using CBT, Insight oriented approach and EMDR. ? ?Patient approved the treatment plan. ? ?Gera Inboden G Deleon Passe, LCSW ? ? ? ? ? ? ? ? ? ? ? ? ? ? ? ? ? ?Eliasar Hlavaty G Yuki Brunsman, LCSW ? ? ? ? ? ? ? ? ? ? ? ? ? ? ?Analese Sovine G Shalese Strahan, LCSW ? ? ? ? ? ? ? ? ? ? ? ? ? ? ?Brandee Markin G Kaan Tosh, LCSW ? ? ? ? ? ? ? ? ? ? ? ? ? ? ?Sirron Francesconi G Neira Bentsen, LCSW ? ? ? ? ? ? ? ? ? ? ? ? ? ? ?Tinita Brooker G Tonga Prout, LCSW ? ? ? ? ? ? ? ? ? ? ? ? ? ? ?  Rhandi Despain G Hawley Michel, LCSW ?

## 2022-02-27 ENCOUNTER — Telehealth: Payer: Self-pay

## 2022-02-27 NOTE — Telephone Encounter (Signed)
Prior Authorization submitted and approved for REXULTI 2 MG effective 02/27/2022-02/26/2023 with [employer] ID# [id number] ?

## 2022-03-04 DIAGNOSIS — N393 Stress incontinence (female) (male): Secondary | ICD-10-CM | POA: Diagnosis not present

## 2022-03-06 ENCOUNTER — Encounter: Payer: Self-pay | Admitting: Gastroenterology

## 2022-03-07 ENCOUNTER — Telehealth: Payer: Self-pay | Admitting: Gastroenterology

## 2022-03-07 NOTE — Telephone Encounter (Signed)
Pt stated that recently that she is have diarrhea more frequently and stools are green: Pt questioning if she has an infection: Pt was scheduled with an APP with soonest availability: Pt scheduled on 03/12/2022 at 1:30 to see Nicoletta Ba PA:  Address Provided: ?Pt verbalized understanding with all questions answered.  ? ?

## 2022-03-07 NOTE — Telephone Encounter (Signed)
Patient called following up on previous call, please advise.   ?

## 2022-03-07 NOTE — Telephone Encounter (Signed)
Inbound call from patient stating she believes she may have an intestinal infection and is seeking advice if there Is anything she can do to help before her appointment in May with Dr. Lyndel Safe. Please advise.  ?

## 2022-03-08 ENCOUNTER — Ambulatory Visit (INDEPENDENT_AMBULATORY_CARE_PROVIDER_SITE_OTHER): Payer: BC Managed Care – PPO | Admitting: Psychology

## 2022-03-08 DIAGNOSIS — F331 Major depressive disorder, recurrent, moderate: Secondary | ICD-10-CM | POA: Diagnosis not present

## 2022-03-08 DIAGNOSIS — F419 Anxiety disorder, unspecified: Secondary | ICD-10-CM

## 2022-03-08 NOTE — Progress Notes (Signed)
[facility] Counselor/Therapist Progress Note ? ?Patient ID: ADESUWA OSGOOD, MRN: [id number],   ? ?Date: 03/08/2022 ? ?Time Spent: 60 minutes ? ?Treatment Type: Individual Therapy ? ?Reported Symptoms: depression ? ?Mental Status Exam: ?Appearance:  Casual     ?Behavior: Appropriate  ?Motor: Normal  ?Speech/Language:  Normal Rate  ?Affect: Blunt  ?Mood: pleasant  ?Thought process: normal  ?Thought content:   WNL  ?Sensory/Perceptual disturbances:   WNL  ?Orientation: oriented to person, place, time/date, and situation  ?Attention: Good  ?Concentration: Good  ?Memory: WNL  ?Fund of knowledge:  Good  ?Insight:   Good  ?Judgment:  Good  ?Impulse Control: Good  ? ?Risk Assessment: ?Danger to Self:  No ?Self-injurious Behavior: No ?Danger to Others: No ?Duty to Warn:no ?Physical Aggression / Violence:No  ?Access to Firearms a concern: No  ?Gang Involvement:No  ? ?Subjective: The patient attended a face-to-face individual therapy session via video visit today.  The patient gave verbal consent for the session to be on video on WebEx.  The patient was in her home alone and the therapist was in the office.  The patient presents as pleasant and cooperative.  The patient is smiling today.  We talked today about her feeling better since her surgery.  The patient also reports that she is going today to pick up her granddaughters and feels very happy about this.  The patient seems to be doing a better job of not going toward the negative today and I encouraged her to continue to stay neutral or positive as opposed to staying in the negative feelings.  We will continue to work on helping patient reframe and normalize. ? ?Interventions: Cognitive Behavioral Therapy ? ?Diagnosis:Anxiety disorder, unspecified type ? ?Major depressive disorder, recurrent episode, moderate ([place]) ? ?Plan: Please see Treatment plan in Therapy charts with target date of 05/01/2022 for goals and progress towards goals.  Will continue to see  the patient at least biweekly and work with her using CBT, Insight oriented approach and EMDR. ? ?Patient approved the treatment plan. ? ?Luverna Degenhart G Alianys Chacko, LCSW ? ? ? ? ? ? ? ? ? ? ? ? ? ? ? ? ? ?Ryelan Kazee G Leah Skora, LCSW ? ? ? ? ? ? ? ? ? ? ? ? ? ? ?Davied Nocito G Ralphie Lovelady, LCSW ? ? ? ? ? ? ? ? ? ? ? ? ? ? ?Shuan Statzer G Aris Moman, LCSW ? ? ? ? ? ? ? ? ? ? ? ? ? ? ?Newel Oien G Pranshu Lyster, LCSW ? ? ? ? ? ? ? ? ? ? ? ? ? ? ?Loistine Eberlin G Ferron Ishmael, LCSW ? ? ? ? ? ? ? ? ? ? ? ? ? ? ?Meridee Branum G Roylene Heaton, LCSW ? ? ? ? ? ? ? ? ? ? ? ? ? ? ?Cope Marte G Maxcine Strong, LCSW ?

## 2022-03-12 ENCOUNTER — Encounter: Payer: Self-pay | Admitting: Physician Assistant

## 2022-03-12 ENCOUNTER — Other Ambulatory Visit (INDEPENDENT_AMBULATORY_CARE_PROVIDER_SITE_OTHER): Payer: BC Managed Care – PPO

## 2022-03-12 ENCOUNTER — Ambulatory Visit (INDEPENDENT_AMBULATORY_CARE_PROVIDER_SITE_OTHER): Payer: BC Managed Care – PPO | Admitting: Physician Assistant

## 2022-03-12 ENCOUNTER — Other Ambulatory Visit: Payer: BC Managed Care – PPO

## 2022-03-12 VITALS — BP 118/74 | HR 84 | Ht 66.0 in | Wt 281.6 lb

## 2022-03-12 DIAGNOSIS — R109 Unspecified abdominal pain: Secondary | ICD-10-CM

## 2022-03-12 DIAGNOSIS — R197 Diarrhea, unspecified: Secondary | ICD-10-CM

## 2022-03-12 DIAGNOSIS — K6289 Other specified diseases of anus and rectum: Secondary | ICD-10-CM

## 2022-03-12 DIAGNOSIS — Z86010 Personal history of colonic polyps: Secondary | ICD-10-CM

## 2022-03-12 DIAGNOSIS — K580 Irritable bowel syndrome with diarrhea: Secondary | ICD-10-CM

## 2022-03-12 DIAGNOSIS — K769 Liver disease, unspecified: Secondary | ICD-10-CM | POA: Diagnosis not present

## 2022-03-12 DIAGNOSIS — K7460 Unspecified cirrhosis of liver: Secondary | ICD-10-CM | POA: Diagnosis not present

## 2022-03-12 DIAGNOSIS — K219 Gastro-esophageal reflux disease without esophagitis: Secondary | ICD-10-CM

## 2022-03-12 LAB — COMPREHENSIVE METABOLIC PANEL
ALT: 30 U/L (ref 0–35)
AST: 32 U/L (ref 0–37)
Albumin: 4.2 g/dL (ref 3.5–5.2)
Alkaline Phosphatase: 76 U/L (ref 39–117)
BUN: 14 mg/dL (ref 6–23)
CO2: 22 mEq/L (ref 19–32)
Calcium: 9 mg/dL (ref 8.4–10.5)
Chloride: 107 mEq/L (ref 96–112)
Creatinine, Ser: 1.05 mg/dL (ref 0.40–1.20)
GFR: 61.87 mL/min (ref >60.00)
Glucose, Bld: 108 mg/dL — ABNORMAL HIGH (ref 70–99)
Potassium: 4.1 mEq/L (ref 3.5–5.1)
Sodium: 137 mEq/L (ref 135–145)
Total Bilirubin: 0.4 mg/dL (ref 0.2–1.2)
Total Protein: 7.1 g/dL (ref 6.0–8.3)

## 2022-03-12 LAB — CBC WITH DIFFERENTIAL/PLATELET
Basophils Absolute: 0.1 10*3/uL (ref 0.0–0.1)
Basophils Relative: 0.7 % (ref 0.0–3.0)
Eosinophils Absolute: 0 10*3/uL (ref 0.0–0.7)
Eosinophils Relative: 0 % (ref 0.0–5.0)
HCT: 39.5 % (ref 36.0–46.0)
Hemoglobin: 12.9 g/dL (ref 12.0–15.0)
Lymphocytes Relative: 16.9 % (ref 12.0–46.0)
Lymphs Abs: 1.5 10*3/uL (ref 0.7–4.0)
MCHC: 32.7 g/dL (ref 30.0–36.0)
MCV: 82.2 fl (ref 78.0–100.0)
Monocytes Absolute: 0.6 10*3/uL (ref 0.1–1.0)
Monocytes Relative: 6.3 % (ref 3.0–12.0)
Neutro Abs: 6.9 10*3/uL (ref 1.4–7.7)
Neutrophils Relative %: 76.1 % (ref 43.0–77.0)
Platelets: 254 10*3/uL (ref 150.0–400.0)
RBC: 4.81 Mil/uL (ref 3.87–5.11)
RDW: 15.2 % (ref 11.5–15.5)
WBC: 9.1 10*3/uL (ref 4.0–10.5)

## 2022-03-12 LAB — PROTIME-INR
INR: 1 ratio (ref 0.8–1.0)
Prothrombin Time: 11.2 s (ref 9.6–13.1)

## 2022-03-12 MED ORDER — AMBULATORY NON FORMULARY MEDICATION: 0 refills | Status: DC

## 2022-03-12 MED ORDER — DICYCLOMINE HCL 10 MG PO CAPS: ORAL_CAPSULE | ORAL | 3 refills | Status: DC

## 2022-03-12 NOTE — Progress Notes (Signed)
? ?Subjective:  ? ? Patient ID: Jacqueline Fowler, female    DOB: 02/01/1971, 51 y.o.   MRN: [id number] ? ?HPI ?Jacqueline Fowler is a 51 year old white female, established with Dr. Lyndel Safe, who comes in today with complaints of diarrhea. ?She was last seen in December 2022, has history of IBS-D, prior colonoscopy in March 2020 was pertinent for adenomatous colon polyps negative random biopsies.  She may have a component of bile salt induced diarrhea and also was treated for Campylobacter jejuni in 2020. ? ?She also has history of cirrhosis likely secondary to NSAID/no EtOH use/Childs class A ?History of chronic GERD. ?She was also noted on CT June 2022 to have incidental mesenteric adenopathy felt likely reactive secondary to radiology and plan was for follow-up CT June 2023. ?Last ultrasound October 2022 with coarsened echogenicity of the hepatic parenchyma and nodularity consistent with cirrhosis no discrete lesions noted, borderline splenomegaly status postcholecystectomy and 6 mm nonobstructing left renal stone ? ?Patient says she has had some component of loose stools long-term but over the past 1 to 2 weeks has been having "excessive diarrhea".  She says she is having 3-4 bowel movements of loose light green to yellow stool with mucus early in the mornings and then may have up to a total of 10 bowel movements per day.  This has been associated with urgency.  She has not noted any melena or hematochezia, has been having some crampy abdominal pain which she feels more in the right abdomen.  No associated nausea or vomiting no fever or chills. ?No recent changes in medications, no recent antibiotics. ?She also mentions that she has been having anal rectal pain with wiping, she is not having pain with defecation.  This has been going on over the past couple of months and predated her recent surgery on 01/22/2022 for uterovaginal prolapse and repair of rectocele. ? ?She is also concerned because she has gained about 50 pounds over  the past 5 months.  She says she has been eating a lot, had been stressed and less active around the time of her surgery etc.  She has been having more acid reflux symptoms particularly at night, sometimes waking up with coughing to the point of bringing up bitter liquid and vomiting.  She is on twice daily Protonix and has  used Pepcid at bedtime fairly frequently. ? ?Review of Systems.Pertinent positive and negative review of systems were noted in the above HPI section.  All other review of systems was otherwise negative.  ? ?Outpatient Encounter Medications as of 03/12/2022  ?Medication Sig  ? ALOE VERA JUICE PO Take by mouth.  ? AMBULATORY NON FORMULARY MEDICATION Medication Name: GI Cocktail ?Equal parts of bentyl, 2% viscous lidocaine and Maalox ?Take 54m every 4-6 hours as needed  ? AMBULATORY NON FORMULARY MEDICATION Medication Name: Diltiazem 2%/ Lidocaine 5% ?Using your index finger, apply a small amount of medication inside the rectum up to your first knuckle/joint three-four times daily until resolved.  ? dicyclomine (BENTYL) 10 MG capsule Take 1 capsule 2-3 times daily  ? Digestive Enzymes (PAPAYA AND ENZYMES PO) Take by mouth.  ? famotidine (PEPCID) 20 MG tablet TAKE 1 TABLET BY MOUTH EVERYDAY AT BEDTIME  ? hyoscyamine (LEVBID) 0.375 MG 12 hr tablet Take 1 tablet (0.375 mg total) by mouth 2 (two) times daily.  ? LORazepam (ATIVAN) 0.5 MG tablet TAKE 1 TABLET (0.5 MG TOTAL) BY MOUTH DAILY AS NEEDED FOR ANXIETY (PANIC ATTACK)  ? methocarbamol (ROBAXIN) 500 MG tablet TAKE  1 TABLET BY MOUTH THREE TIMES A DAY AS NEEDED FOR 10 DAYS  ? Multiple Vitamins-Minerals (CENTRUM SILVER 50+WOMEN PO) Take by mouth.  ? ondansetron (ZOFRAN ODT) 4 MG disintegrating tablet Take 1 tablet (4 mg total) by mouth every 8 (eight) hours as needed for nausea or vomiting.  ? pantoprazole (PROTONIX) 40 MG tablet Take 1 tablet (40 mg total) by mouth 2 (two) times daily. Take half hour before breakfast and half hour before supper.  ?  propranolol (INDERAL) 10 MG tablet TAKE 1 TO 2 TABLETS BY MOUTH TWICE A DAY AS NEEDED FOR ANXIETY  ? Pyridoxine HCl (VITAMIN B-6 PO) Take by mouth.  ? traZODone (DESYREL) 100 MG tablet Take 1/2-1 tablet po QHS prn insomnia  ? VITAMIN D PO Take by mouth.  ? brexpiprazole (REXULTI) 2 MG TABS tablet Take 1 tablet (2 mg total) by mouth daily.  ? busPIRone (BUSPAR) 30 MG tablet Take 1 tablet (30 mg total) by mouth 2 (two) times daily.  ? sertraline (ZOLOFT) 100 MG tablet Take 1 tablet (100 mg total) by mouth in the morning and at bedtime.  ? [DISCONTINUED] busPIRone (BUSPAR) 15 MG tablet TAKE 1 TABLET BY MOUTH TWICE A DAY  ? [DISCONTINUED] lisdexamfetamine (VYVANSE) 70 MG capsule Take 1 capsule (70 mg total) by mouth daily.  ? [DISCONTINUED] sucralfate (CARAFATE) 1 g tablet Take 1 tablet (1 g total) by mouth QID. For 4 weeks  ? [DISCONTINUED] sucralfate (CARAFATE) 1 GM/10ML suspension Take 10 mLs (1 g total) by mouth 4 (four) times daily. Take for 4 weeks  ? ?Facility-Administered Encounter Medications as of 03/12/2022  ?Medication  ? 0.9 %  sodium chloride infusion  ? ?No Known Allergies ?Patient Active Problem List  ? Diagnosis Date Noted  ? Hepatic cirrhosis ([place]) 03/12/2022  ? GERD (gastroesophageal reflux disease) 03/12/2022  ? Hx of adenomatous colonic polyps 03/12/2022  ? GAD (generalized anxiety disorder) 12/15/2018  ? Mood disorder ([place]) 12/15/2018  ? ?Social History  ? ?Socioeconomic History  ? Marital status: Married  ?  Spouse name: Not on file  ? Number of children: 4  ? Years of education: Not on file  ? Highest education level: Not on file  ?Occupational History  ? Occupation: [employer] manager  ?Tobacco Use  ? Smoking status: Former  ? Smokeless tobacco: Never  ? Tobacco comments:  ?  quit 2001 smoking  ?Vaping Use  ? Vaping Use: Never used  ?Substance and Sexual Activity  ? Alcohol use: Not Currently  ?  Alcohol/week: 1.0 standard drink  ?  Types: 1 Glasses of wine per week  ?  Comment: ocassionally  ?  Drug use: Never  ? Sexual activity: Yes  ?  Birth control/protection: Coitus interruptus  ?  Comment: husband had vasectomy  ?Other Topics Concern  ? Not on file  ?Social History Narrative  ? Not on file  ? ?Social Determinants of Health  ? ?Financial Resource Strain: Not on file  ?Food Insecurity: Not on file  ?Transportation Needs: Not on file  ?Physical Activity: Not on file  ?Stress: Not on file  ?Social Connections: Not on file  ?Intimate Partner Violence: Not on file  ? ? ?Ms. Matthews's family history includes Depression in her son. ? ? ?   ?Objective:  ?  ?Vitals:  ? 03/12/22 1321  ?BP: 118/74  ?Pulse: 84  ? ? ?Physical Exam Well-developed well-nourished  WF in no acute distress.  Height, Weight, 281 BMI 45.4 ? ?HEENT; nontraumatic normocephalic, EOMI,  PE R LA, sclera anicteric. ?Oropharynx; not examined today ?Neck; supple, no JVD ?Cardiovascular; regular rate and rhythm with S1-S2, no murmur rub or gallop ?Pulmonary; Clear bilaterally ?Abdomen; soft, obese nondistended, no focal tenderness today, no palpable mass or hepatosplenomegaly, bowel sounds are active ?Rectal; no external lesions noted, on digital exam she has a shallow fissure at the 7 o'clock position quite tender to palpation ?Skin; benign exam, no jaundice rash or appreciable lesions ?Extremities; no clubbing cyanosis or edema skin warm and dry ?Neuro/Psych; alert and oriented x4, grossly nonfocal mood and affect appropriate  ? ? ? ?   ?Assessment & Plan:  ? ?#65 51 year old white female with history of IBS-D who comes in today with significant exacerbation of symptoms over the past couple of weeks with up to 10 loose bowel movements per day, associated with abdominal cramping and urgency.  The symptoms are not typical for her IBS. ? ?Rule out infectious gastroenteritis, ? ?#2 status post recent total vaginal hysterectomy, uterosacral sling, rectocele repair and mid urethral sling March 2023 ? ?#3 anal fissure ?#4 cirrhosis felt likely secondary to  NAFLD ?#5 GERD-recent increase in nocturnal symptoms despite twice daily PPI ?#6 weight gain ?#7 abnormal CT with mesenteric adenopathy felt likely reactive June 2022-and was for follow-up June 2023 ?#8

## 2022-03-12 NOTE — Patient Instructions (Addendum)
If you are age 51 or younger, your body mass index should be between 19-25. Your Body mass index is 45.45 kg/m?Marland Kitchen If this is out of the aformentioned range listed, please consider follow up with your Primary Care Provider.  ?________________________________________________________ ? ?The [facility] providers would like to encourage you to use [facility] to communicate with providers for non-urgent requests or questions.  Due to long hold times on the telephone, sending your provider a message by [facility] may be a faster and more efficient way to get a response.  Please allow 48 business hours for a response.  Please remember that this is for non-urgent requests.  ?_______________________________________________________ ? ?Your provider has requested that you go to the basement level for lab work before leaving today. Press "B" on the elevator. The lab is located at the first door on the left as you exit the elevator. ? ?You have been scheduled for an abdominal ultrasound at [facility] (1st floor of hospital) on 03/15/2022 at 10:00 am. Please arrive 15 minutes prior to your appointment for registration. Make certain not to have anything to eat or drink 6 hours prior to your appointment. Should you need to reschedule your appointment, please contact radiology at [phone]. This test typically takes about 30 minutes to perform. ? ?Start Dicyclomine 10 mg 1 capsule 2-3 times daily. ? ?We have sent a prescription for Diltiazem 2% gel to [facility] for you. Using your index finger, you should apply a small amount of medication inside the rectum up to your first knuckle/joint 3-4 times daily. ? ?[facility]'s information is below: ?Address: [address], [place], [place] [address]  ?[phone] ? ?*Please DO NOT go directly from our office to pick up this medication! Give the pharmacy 1 day to process the prescription as this is compounded and takes time to make. ? ?Continue Pantoprazole  and Famotidine ? ?Try using Imodium up to 4 times a day. ? ?You have been scheduled to follow up with Dr. Lyndel Safe on April 23, 2022 at 2:10 pm.  ? ?**Starting Mar 25, 2022 Dr. Lyndel Safe will no longer be seeing patient's in [facility], but will be seeing here in [place] at the [facility] office on the 2nd floor. ? ?Thank you for entrusting me with your care and choosing [facility]. ? ?Thank you for entrusting me with your care and choosing [facility]. ? ?Nicoletta Ba, PA-C ?

## 2022-03-13 ENCOUNTER — Telehealth: Payer: Self-pay

## 2022-03-13 DIAGNOSIS — K7460 Unspecified cirrhosis of liver: Secondary | ICD-10-CM

## 2022-03-13 NOTE — Telephone Encounter (Signed)
-----   Message from Alfredia Ferguson, PA-C sent at 03/12/2022  5:19 PM EDT ----- ?Regarding: Jacqueline Fowler ?After I reviewed this patient's chart further while dictating, I would like to cancel the ultrasound, and schedule her for CT of the abdomen and pelvis instead.  Let her know that this will be to further assess her cirrhosis and also Dr. Lyndel Safe had intended for her to have a follow-up CT in June 2023 to reassess some other changes with mesenteric adenopathy that had been noted on a prior CT. ? ?Hopefully this CT scan can be scheduled within the next couple of weeks-thank you ? ?

## 2022-03-13 NOTE — Telephone Encounter (Signed)
Called patient to advise that Jacqueline Fowler had went back over Dr. Steve Rattler last office visit and seen that she wanted a follow up CT in a year. I advised that we would cancel her Ultrasound and schedule a CT. She will still come by the lab to drop off the rest of her stool cultures and pick up oral contrast. ?

## 2022-03-14 LAB — AFP TUMOR MARKER: AFP-Tumor Marker: 4 ng/mL

## 2022-03-14 NOTE — Telephone Encounter (Signed)
Order has been placed for procedure. Contrast will be up at the front desk for the patient to pick up ?

## 2022-03-15 ENCOUNTER — Other Ambulatory Visit: Payer: BC Managed Care – PPO

## 2022-03-15 ENCOUNTER — Telehealth: Payer: Self-pay | Admitting: Physician Assistant

## 2022-03-15 ENCOUNTER — Ambulatory Visit (HOSPITAL_COMMUNITY): Payer: BC Managed Care – PPO

## 2022-03-15 LAB — GI PROFILE, STOOL, PCR

## 2022-03-15 LAB — CLOSTRIDIUM DIFFICILE BY PCR: Toxigenic C. Difficile by PCR: NEGATIVE

## 2022-03-15 MED ORDER — AZITHROMYCIN 500 MG PO TABS: 1000.0000 mg | ORAL_TABLET | Freq: Once | ORAL | 0 refills | Status: AC

## 2022-03-15 NOTE — Telephone Encounter (Signed)
Called the patient to advise. Had to leave a message on her voicemail. She uses My Chart. I will send her a message that way as well and follow up to be certain she has seen it. ?

## 2022-03-15 NOTE — Telephone Encounter (Signed)
Doctor of the day ?This is a 51 year old patient of Dr Lyndel Safe who was seen by Nicoletta Ba, PA on 03/12/22. Her symptoms are over the past couple of weeks she has had up to 10 loose bowel movements per day, associated with abdominal cramping and urgency.  ?Stool pathogen panel has shown a positive for enteropathogenic E coli. ?She was given Lomotil and Bentyl at her office visit. She has no known drug allergies. She has hepatic cirrhosis with normal liver functions. ?Do you want to start her on antibiotics? ?Thanks ?

## 2022-03-15 NOTE — Telephone Encounter (Signed)
Lab result alert called in for E-coli  ?

## 2022-03-15 NOTE — Telephone Encounter (Signed)
Patient has been scheduled for 5/17 at 3:30. She has already picked up her contrast. Message of date/time has been sent through My Chart. ?

## 2022-03-15 NOTE — Telephone Encounter (Signed)
I have sent in a prescription for azithromycin 1000 mg and 1 dose.  Please have her take that.  Hopefully that will help her resolve this. ?

## 2022-03-15 NOTE — Telephone Encounter (Signed)
Left message with [facility] that patient needs to be scheduled for CT abd/Pelvis. ?

## 2022-03-18 NOTE — Telephone Encounter (Signed)
Patient advised.

## 2022-03-18 NOTE — Telephone Encounter (Signed)
Jacqueline Fowler ?Spoke with the patient. She is still having stomach cramps. Stools are soft unformed. She has some urgency. She has had 4 or 5 stools today. She is not eating because she is afraid to. She is focused on her hydration. She has not taken Imodium today. ?When do you expect the stomach cramps and frequent stools to go away? ?

## 2022-03-19 LAB — FECAL LACTOFERRIN, QUANT
Fecal Lactoferrin: NEGATIVE
MICRO NUMBER:: 13357939
SPECIMEN QUALITY:: ADEQUATE

## 2022-03-21 NOTE — Telephone Encounter (Signed)
Expected. ?If not better in 5 days, let us know ?RG ?

## 2022-03-22 ENCOUNTER — Ambulatory Visit (INDEPENDENT_AMBULATORY_CARE_PROVIDER_SITE_OTHER): Payer: BC Managed Care – PPO | Admitting: Psychology

## 2022-03-22 DIAGNOSIS — F419 Anxiety disorder, unspecified: Secondary | ICD-10-CM

## 2022-03-22 DIAGNOSIS — R928 Other abnormal and inconclusive findings on diagnostic imaging of breast: Secondary | ICD-10-CM | POA: Diagnosis not present

## 2022-03-22 DIAGNOSIS — F5081 Binge eating disorder: Secondary | ICD-10-CM

## 2022-03-22 DIAGNOSIS — F331 Major depressive disorder, recurrent, moderate: Secondary | ICD-10-CM | POA: Diagnosis not present

## 2022-03-22 DIAGNOSIS — R922 Inconclusive mammogram: Secondary | ICD-10-CM | POA: Diagnosis not present

## 2022-03-22 NOTE — Progress Notes (Signed)
[facility] Counselor/Therapist Progress Note ? ?Patient ID: ATIRA BORELLO, MRN: [id number],   ? ?Date: 03/22/2022 ? ?Time Spent: 60 minutes ? ?Treatment Type: Individual Therapy ? ?Reported Symptoms: depression ? ?Mental Status Exam: ?Appearance:  Casual     ?Behavior: Appropriate  ?Motor: Normal  ?Speech/Language:  Normal Rate  ?Affect: Blunt  ?Mood: pleasant  ?Thought process: normal  ?Thought content:   WNL  ?Sensory/Perceptual disturbances:   WNL  ?Orientation: oriented to person, place, time/date, and situation  ?Attention: Good  ?Concentration: Good  ?Memory: WNL  ?Fund of knowledge:  Good  ?Insight:   Good  ?Judgment:  Good  ?Impulse Control: Good  ? ?Risk Assessment: ?Danger to Self:  No ?Self-injurious Behavior: No ?Danger to Others: No ?Duty to Warn:no ?Physical Aggression / Violence:No  ?Access to Firearms a concern: No  ?Gang Involvement:No  ? ?Subjective: The patient attended a face-to-face individual therapy session via video visit today.  The patient gave verbal consent for the session to be on video on WebEx.  The patient was in her home alone and the therapist was in the office.  The patient presents as pleasant and cooperative.  The patient talked about some of her other ailments that she has going on today.  The patient is frustrated because she is gaining weight and has not been able to lose anything even though she has cirrhosis of the liver.  We talked about her changing how she thinks and that she has got to catch herself before she starts lamenting over the negative things that she is doing or how she feels about herself.  She tends to start thinking about all the things that she has done or does that she does not like about herself and then we have to pull her out of the depression.  We also did some problem solving about what she is going to do to implement a new eating program. ? ?Interventions: Cognitive Behavioral Therapy ? ?Diagnosis:Major depressive disorder, recurrent  episode, moderate ([place]) ? ?Binge eating disorder ? ?Anxiety disorder, unspecified type ? ?Plan: Please see Treatment plan in Therapy charts with target date of 05/01/2022 for goals and progress towards goals.  Will continue to see the patient at least biweekly and work with her using CBT, Insight oriented approach and EMDR. ? ?Patient approved the treatment plan. ? ?Kacen Mellinger G Khani Paino, LCSW ? ? ? ? ? ? ? ? ? ? ? ? ? ? ? ? ? ?Kirstin Kugler G Saige Canton, LCSW ? ? ? ? ? ? ? ? ? ? ? ? ? ? ?Karlynn Furrow G Clarity Ciszek, LCSW ? ? ? ? ? ? ? ? ? ? ? ? ? ? ?Deola Rewis G Eren Ryser, LCSW ? ? ? ? ? ? ? ? ? ? ? ? ? ? ?Shuna Tabor G Kenneshia Rehm, LCSW ? ? ? ? ? ? ? ? ? ? ? ? ? ? ?Halee Glynn G Cassandra Mcmanaman, LCSW ? ? ? ? ? ? ? ? ? ? ? ? ? ? ?Hamad Whyte G Arun Herrod, LCSW ? ? ? ? ? ? ? ? ? ? ? ? ? ? ?Inice Sanluis G Aunisty Reali, LCSW ? ? ? ? ? ? ? ? ? ? ? ? ? ? ?Nyemah Watton G Kayia Billinger, LCSW ?

## 2022-03-24 NOTE — Progress Notes (Signed)
Agree with assessment/plan RG 

## 2022-03-27 ENCOUNTER — Ambulatory Visit (INDEPENDENT_AMBULATORY_CARE_PROVIDER_SITE_OTHER)
Admission: RE | Admit: 2022-03-27 | Discharge: 2022-03-27 | Disposition: A | Discharge status: Home / Self Care | Payer: BC Managed Care – PPO | Source: Ambulatory Visit | Attending: Physician Assistant | Admitting: Physician Assistant

## 2022-03-27 ENCOUNTER — Telehealth (INDEPENDENT_AMBULATORY_CARE_PROVIDER_SITE_OTHER): Payer: BC Managed Care – PPO | Admitting: Psychiatry

## 2022-03-27 ENCOUNTER — Encounter: Payer: Self-pay | Admitting: Psychiatry

## 2022-03-27 DIAGNOSIS — F419 Anxiety disorder, unspecified: Secondary | ICD-10-CM

## 2022-03-27 DIAGNOSIS — N200 Calculus of kidney: Secondary | ICD-10-CM | POA: Diagnosis not present

## 2022-03-27 DIAGNOSIS — I700 Atherosclerosis of aorta: Secondary | ICD-10-CM | POA: Diagnosis not present

## 2022-03-27 DIAGNOSIS — F3342 Major depressive disorder, recurrent, in full remission: Secondary | ICD-10-CM | POA: Diagnosis not present

## 2022-03-27 DIAGNOSIS — K7460 Unspecified cirrhosis of liver: Secondary | ICD-10-CM

## 2022-03-27 MED ORDER — IOHEXOL 300 MG/ML  SOLN
100.0000 mL | Freq: Once | INTRAMUSCULAR | Status: AC | PRN
  Administered 2022-03-27: 100 mL via INTRAVENOUS

## 2022-03-27 MED ORDER — PROPRANOLOL HCL 10 MG PO TABS: ORAL_TABLET | ORAL | 1 refills | Status: DC

## 2022-03-27 MED ORDER — BREXPIPRAZOLE 2 MG PO TABS: 2.0000 mg | ORAL_TABLET | Freq: Every day | ORAL | 1 refills | Status: DC

## 2022-03-27 MED ORDER — SERTRALINE HCL 100 MG PO TABS: 100.0000 mg | ORAL_TABLET | Freq: Two times a day (BID) | ORAL | 1 refills | Status: DC

## 2022-03-27 MED ORDER — BUSPIRONE HCL 30 MG PO TABS
30.0000 mg | ORAL_TABLET | Freq: Two times a day (BID) | ORAL | 1 refills | Status: DC
Start: 2022-03-27 — End: 2022-09-27

## 2022-03-27 NOTE — Progress Notes (Signed)
Jacqueline Fowler ?[id number] ?12-Feb-1971 ?51 y.o. ? ?Virtual Visit via Video Note ? ?I connected with pt @ on 03/27/22 at 11:30 AM EDT by a video enabled telemedicine application and verified that I am speaking with the correct person using two identifiers. ?  ?I discussed the limitations of evaluation and management by telemedicine and the availability of in person appointments. The patient expressed understanding and agreed to proceed. ? ?I discussed the assessment and treatment plan with the patient. The patient was provided an opportunity to ask questions and all were answered. The patient agreed with the plan and demonstrated an understanding of the instructions. ?  ?The patient was advised to call back or seek an in-person evaluation if the symptoms worsen or if the condition fails to improve as anticipated. ? ?I provided 30 minutes of non-face-to-face time during this encounter.  The patient was located at home.  The provider was located at home in [place]. ? ? ?Thayer Headings, PMHNP  ? ?Subjective:  ? ?Patient ID:  Jacqueline Fowler is a 51 y.o. (DOB [birth year]) female. ? ?Chief Complaint:  ?Chief Complaint  ?Patient presents with  ? Follow-up  ?  Depression, anxiety, insomnia  ? ? ?HPI ?Jacqueline Fowler presents for follow-up of anxiety and depression. She reports that her mood has been "good overall" with occ lower moods in response to negative thoughts or certain situations. She reports that she smiles more and catches herself enjoying more things. She reports occ self-critical thoughts.  Denies persistent depressed mood. Denies any recent panic. She reports that she has not had any recent episodes of increased HR. She reports that anxiety has improved overall. She reports that her energy is low and thinks it may be related to liver disease and weight gain. She reports recent weight gain. Appetite has been less overall. She reports that she is eating smaller portions. Denies any recent binge eating. Concentration has been  ok. Denies SI.  ? ?She reports that her surgery went well and she has recovered.  ? ?She reports that she will periodically think that she smells smoke. She reports that she told her ENT and was prescribed a steroid and it seemed to resolve. She reports that she smelled it again recently. She denies any associated symptoms to include headache or dizziness. Denies any triggers or patterns. Denies any other neurological s/s. She plans to follow-up with ENT.  ? ?She reports that she has been sleeping ok overall. Has not taken Trazodone prn recently and has used it only a few times.  ? ?Has not taken Ativan prn recently. ? ?She has been seeing Bambi Cottle, LCSW every 2 weeks.  ? ?Past Psychiatric Medication Trials: ?Valium ?Sertraline ?Prozac ?Wellbutrin-adverse reaction ?Rexulti ?Abilify- Weight gain ?Trazodone ?Buspar ?Propranolol ?Ativan ? ?Review of Systems:  ?Review of Systems  ?Musculoskeletal:  Negative for gait problem.  ?Neurological:  Positive for dizziness.  ?     Occ headaches.  ?Psychiatric/Behavioral:    ?     Please refer to HPI  ? ?Medications: I have reviewed the patient's current medications. ? ?Current Outpatient Medications  ?Medication Sig Dispense Refill  ? AMBULATORY NON FORMULARY MEDICATION Medication Name: Diltiazem 2%/ Lidocaine 5% ?Using your index finger, apply a small amount of medication inside the rectum up to your first knuckle/joint three-four times daily until resolved. 30 g 0  ? dicyclomine (BENTYL) 10 MG capsule Take 1 capsule 2-3 times daily 90 capsule 3  ? Digestive Enzymes (PAPAYA AND ENZYMES PO) Take by  mouth.    ? famotidine (PEPCID) 20 MG tablet TAKE 1 TABLET BY MOUTH EVERYDAY AT BEDTIME 30 tablet 0  ? hyoscyamine (LEVBID) 0.375 MG 12 hr tablet Take 1 tablet (0.375 mg total) by mouth 2 (two) times daily. 60 tablet 6  ? methocarbamol (ROBAXIN) 500 MG tablet TAKE 1 TABLET BY MOUTH THREE TIMES A DAY AS NEEDED FOR 10 DAYS    ? Multiple Vitamins-Minerals (CENTRUM SILVER 50+WOMEN PO)  Take by mouth.    ? pantoprazole (PROTONIX) 40 MG tablet Take 1 tablet (40 mg total) by mouth 2 (two) times daily. Take half hour before breakfast and half hour before supper. 60 tablet 11  ? PAPAYA ENZYME PO Take by mouth.    ? Pyridoxine HCl (VITAMIN B-6 PO) Take by mouth.    ? VITAMIN D PO Take by mouth.    ? AMBULATORY NON FORMULARY MEDICATION Medication Name: GI Cocktail ?Equal parts of bentyl, 2% viscous lidocaine and Maalox ?Take 71m every 4-6 hours as needed (Patient not taking: Reported on 03/27/2022) 240 mL 2  ? brexpiprazole (REXULTI) 2 MG TABS tablet Take 1 tablet (2 mg total) by mouth daily. 90 tablet 1  ? busPIRone (BUSPAR) 30 MG tablet Take 1 tablet (30 mg total) by mouth 2 (two) times daily. 180 tablet 1  ? LORazepam (ATIVAN) 0.5 MG tablet TAKE 1 TABLET (0.5 MG TOTAL) BY MOUTH DAILY AS NEEDED FOR ANXIETY (PANIC ATTACK) (Patient not taking: Reported on 03/27/2022) 15 tablet 0  ? ondansetron (ZOFRAN ODT) 4 MG disintegrating tablet Take 1 tablet (4 mg total) by mouth every 8 (eight) hours as needed for nausea or vomiting. 25 tablet 0  ? propranolol (INDERAL) 10 MG tablet TAKE 1 TO 2 TABLETS BY MOUTH TWICE A DAY AS NEEDED FOR ANXIETY 120 tablet 1  ? sertraline (ZOLOFT) 100 MG tablet Take 1 tablet (100 mg total) by mouth in the morning and at bedtime. 180 tablet 1  ? traZODone (DESYREL) 100 MG tablet Take 1/2-1 tablet po QHS prn insomnia (Patient not taking: Reported on 03/27/2022) 30 tablet 2  ? ?Current Facility-Administered Medications  ?Medication Dose Route Frequency Provider Last Rate Last Admin  ? 0.9 %  sodium chloride infusion  500 mL Intravenous Once GJackquline Denmark MD      ? ? ?Medication Side Effects: None  Denies involuntary movements.  ? ?Allergies: No Known Allergies ? ?Past Medical History:  ?Diagnosis Date  ? Anxiety   ? Depression   ? Gallstones 2004  ? GERD (gastroesophageal reflux disease)   ? IBS (irritable bowel syndrome)   ? Obesity   ? UTI (urinary tract infection)   ? ? ?Family  History  ?Problem Relation Age of Onset  ? Depression Son   ? Colon cancer Neg Hx   ? Esophageal cancer Neg Hx   ? Rectal cancer Neg Hx   ? Stomach cancer Neg Hx   ? ? ?Social History  ? ?Socioeconomic History  ? Marital status: Married  ?  Spouse name: Not on file  ? Number of children: 4  ? Years of education: Not on file  ? Highest education level: Not on file  ?Occupational History  ? Occupation: [employer] ?Tobacco Use  ? Smoking status: Former  ? Smokeless tobacco: Never  ? Tobacco comments:  ?  quit 2001 smoking  ?Vaping Use  ? Vaping Use: Never used  ?Substance and Sexual Activity  ? Alcohol use: Not Currently  ?  Alcohol/week: 1.0 standard drink  ?  Types: 1 Glasses of wine per week  ?  Comment: ocassionally  ? Drug use: Never  ? Sexual activity: Yes  ?  Birth control/protection: Coitus interruptus  ?  Comment: husband had vasectomy  ?Other Topics Concern  ? Not on file  ?Social History Narrative  ? Not on file  ? ?Social Determinants of Health  ? ?Financial Resource Strain: Not on file  ?Food Insecurity: Not on file  ?Transportation Needs: Not on file  ?Physical Activity: Not on file  ?Stress: Not on file  ?Social Connections: Not on file  ?Intimate Partner Violence: Not on file  ? ? ?Past Medical History, Surgical history, Social history, and Family history were reviewed and updated as appropriate.  ? ?Please see review of systems for further details on the patient's review from today.  ? ?Objective:  ? ?Physical Exam:  ?There were no vitals taken for this visit. ? ?Physical Exam ?Neurological:  ?   Mental Status: She is alert and oriented to person, place, and time.  ?   Cranial Nerves: No dysarthria.  ?Psychiatric:     ?   Attention and Perception: Attention and perception normal.     ?   Mood and Affect: Mood normal.     ?   Speech: Speech normal.     ?   Behavior: Behavior is cooperative.     ?   Thought Content: Thought content normal. Thought content is not paranoid or delusional. Thought  content does not include homicidal or suicidal ideation. Thought content does not include homicidal or suicidal plan.     ?   Cognition and Memory: Cognition and memory normal.     ?   Judgment: Judgment

## 2022-03-29 ENCOUNTER — Other Ambulatory Visit: Payer: Self-pay

## 2022-03-29 ENCOUNTER — Other Ambulatory Visit: Payer: Self-pay | Admitting: Gastroenterology

## 2022-03-29 ENCOUNTER — Telehealth: Payer: Self-pay | Admitting: Physician Assistant

## 2022-03-29 DIAGNOSIS — K7460 Unspecified cirrhosis of liver: Secondary | ICD-10-CM

## 2022-03-29 DIAGNOSIS — K219 Gastro-esophageal reflux disease without esophagitis: Secondary | ICD-10-CM

## 2022-03-29 DIAGNOSIS — K769 Liver disease, unspecified: Secondary | ICD-10-CM

## 2022-03-29 NOTE — Telephone Encounter (Signed)
Amy, FYI 

## 2022-03-29 NOTE — Telephone Encounter (Signed)
We received a call from patient regarding imaging results. Please advise.

## 2022-03-29 NOTE — Telephone Encounter (Signed)
Order created and Radiology Scheduling notified.

## 2022-03-29 NOTE — Telephone Encounter (Signed)
Discussed results of CT scan with patient in detail CT shows- 12 mm segment VI hepatic lesion is most consistent with a LI-RADS category 3 lesion (intermediate probability of malignancy) AFP normal  Plan: -Proceed with MRI liver with contrast  RG

## 2022-03-29 NOTE — Telephone Encounter (Signed)
Dr Lyndel Safe Would you review the CT on the patient done 03/27/22 in Amy's absence? She is very anxious about the LR-3 of the lesion in her liver.

## 2022-04-01 DIAGNOSIS — D376 Neoplasm of uncertain behavior of liver, gallbladder and bile ducts: Secondary | ICD-10-CM | POA: Diagnosis not present

## 2022-04-01 DIAGNOSIS — K7469 Other cirrhosis of liver: Secondary | ICD-10-CM | POA: Diagnosis not present

## 2022-04-02 ENCOUNTER — Ambulatory Visit: Payer: BC Managed Care – PPO | Admitting: Gastroenterology

## 2022-04-05 ENCOUNTER — Ambulatory Visit (INDEPENDENT_AMBULATORY_CARE_PROVIDER_SITE_OTHER): Payer: BC Managed Care – PPO | Admitting: Psychology

## 2022-04-05 DIAGNOSIS — F5081 Binge eating disorder: Secondary | ICD-10-CM

## 2022-04-05 DIAGNOSIS — F419 Anxiety disorder, unspecified: Secondary | ICD-10-CM | POA: Diagnosis not present

## 2022-04-05 DIAGNOSIS — F331 Major depressive disorder, recurrent, moderate: Secondary | ICD-10-CM | POA: Diagnosis not present

## 2022-04-08 NOTE — Progress Notes (Signed)
[facility] Counselor/Therapist Progress Note  Patient ID: Jacqueline Fowler, MRN: [id number],    Date: 04/05/2022  Time Spent: 60 minutes  Treatment Type: Individual Therapy  Reported Symptoms: depression  Mental Status Exam: Appearance:  Casual     Behavior: Appropriate  Motor: Normal  Speech/Language:  Normal Rate  Affect: Blunt  Mood: pleasant  Thought process: normal  Thought content:   WNL  Sensory/Perceptual disturbances:   WNL  Orientation: oriented to person, place, time/date, and situation  Attention: Good  Concentration: Good  Memory: WNL  Fund of knowledge:  Good  Insight:   Good  Judgment:  Good  Impulse Control: Good   Risk Assessment: Danger to Self:  No Self-injurious Behavior: No Danger to Others: No Duty to Warn:no Physical Aggression / Violence:No  Access to Firearms a concern: No  Gang Involvement:No   Subjective: The patient attended a face-to-face individual therapy session via video visit today.  The patient gave verbal consent for the session to be on video on WebEx.  The patient was in her home alone and the therapist was in the office.  The patient presents with a blunted affect and mood is a little depressed.  The patient was a little tearful when talking about her relationship with her husband.  We discussed an interaction they had and the patient perceived it as him rejecting her.  We talked about changing some of her expectations and how she communicates with her husband.  We will continue to discuss changing her thoughts and perceptions to help her with her depression.  We also discussed her looking for external validation and the need for EMDR  Interventions: Cognitive Behavioral Therapy  Diagnosis:Anxiety disorder, unspecified type  Major depressive disorder, recurrent episode, moderate ([facility])  Binge eating disorder  Plan: Client Abilities/Strengths  Intelligent, motivated, insightful  Client Treatment Preferences   Outpatient Individual therapy  Client Statement of Needs  "I need some help with my depression"  Treatment Level  Outpatient Individual therapy  Symptoms  Depressed or irritable mood.:  (Status: maintained). Feelings of hopelessness,  worthlessness, or inappropriate guilt.: (Status: maintained). History of chronic  or recurrent depression for which the client has taken antidepressant medication, been hospitalized, had outpatient treatment, or had a course of electroconvulsive therapy.: (Status:  maintained). Lack of energy.:  (Status: maintained). Low self-esteem.:  (Status: maintained). Poor concentration and indecisiveness.: (Status: maintained). Sleeplessness or hypersomnia.: (Status:  maintained). Social withdrawal.: (Status: maintained).  Problems Addressed  Unipolar Depression, Unipolar Depression    Goals 1. Develop healthy interpersonal relationships that lead to the alleviation  and help prevent the relapse of depression. 2. Develop healthy thinking patterns and beliefs about self, others, and the world that lead to the alleviation and help prevent the relapse of  depression. Objective Verbalize an understanding of healthy and unhealthy emotions with the intent of increasing the use of  healthy emotions to guide actions. Target Date: 2022-05-01 Frequency: Weekly Progress: 30Modality: individual Objective Learn and implement behavioral strategies to overcome depression. Target Date: 2022-05-01 Frequency: Weekly Progress: 30 Modality: individual Related Interventions 1. Assist the client in developing skills that increase the likelihood of deriving pleasure from  behavioral activation (e.g., assertiveness skills, developing an exercise plan, less internal/more  external focus, increased social involvement); reinforce success. Objective Describe current and past experiences with depression including their impact on functioning and  attempts to resolve it. Target Date:  2022-05-01 Frequency: Weekly Progress: 30 Modality: individual Related Interventions 1. Encourage the client to share his/her thoughts  and feelings of depression; express empathy and  build rapport while identifying primary cognitive, behavioral, interpersonal, or other  contributors to depression.  Objective Learn and implement problem-solving and decision-making skills. Target Date: 2022-05-01 Frequency: Weekly Progress: 30 Modality: individual Related Interventions 1. Encourage in the client the development of a positive problem orientation in which problems  and solving them are viewed as a natural part of life and not something to be feared, despaired,  or avoided. Objective Identify and replace thoughts and beliefs that support depression. Target Date: 2022-05-01 Frequency: Weekly Progress: 0 Modality: individual Related Interventions 1. Conduct Cognitive-Behavioral Therapy (see Cognitive Behavior Therapy by Olevia Bowens; Overcoming Depression by Lynita Lombard al.), beginning with helping the client learn the connection among  cognition, depressive feelings, and actions. 2. Facilitate and reinforce the client's shift from biased depressive self-talk and beliefs to realitybased cognitive messages that enhance self-confidence and increase adaptive actions (see  "Positive Self-Talk" in the Adult Psychotherapy Homework Planner by Bryn Gulling). Diagnosis Axis  none 296.32 (Major depressive affective disorder, recurrent episode, moderate) - Open -  [Signifier: n/a]  Conditions For Discharge Achievement of treatment goals and objectives Will continue to see the patient at least biweekly and work with her using CBT, Insight oriented approach and EMDR.  Patient approved the treatment plan.  Sirus Labrie G Myangel Summons, LCSW                  Lafaye Mcelmurry G Reef Achterberg, LCSW               Antuan Limes G Evita Merida, LCSW               Reiner Loewen G Kyandre Okray, LCSW               Nettye Flegal  G Chace Klippel, LCSW               Persis Graffius G Merikay Lesniewski, LCSW               Janeal Abadi G Hezekiah Veltre, LCSW               Anice Wilshire G Shay Jhaveri, LCSW               Makaya Juneau G Shantee Hayne, LCSW               Davaughn Hillyard G Montgomery Rothlisberger, LCSW

## 2022-04-12 ENCOUNTER — Encounter: Payer: Self-pay | Admitting: Gastroenterology

## 2022-04-12 ENCOUNTER — Ambulatory Visit (HOSPITAL_COMMUNITY): Admission: RE | Admit: 2022-04-12 | Payer: BC Managed Care – PPO | Source: Ambulatory Visit

## 2022-04-15 ENCOUNTER — Encounter: Payer: Self-pay | Admitting: Gastroenterology

## 2022-04-15 ENCOUNTER — Ambulatory Visit (HOSPITAL_COMMUNITY)
Admission: RE | Admit: 2022-04-15 | Discharge: 2022-04-15 | Disposition: A | Discharge status: Home / Self Care | Payer: BC Managed Care – PPO | Source: Ambulatory Visit | Attending: Physician Assistant | Admitting: Physician Assistant

## 2022-04-15 DIAGNOSIS — K769 Liver disease, unspecified: Secondary | ICD-10-CM | POA: Diagnosis not present

## 2022-04-15 DIAGNOSIS — K7460 Unspecified cirrhosis of liver: Secondary | ICD-10-CM | POA: Insufficient documentation

## 2022-04-15 DIAGNOSIS — I868 Varicose veins of other specified sites: Secondary | ICD-10-CM | POA: Diagnosis not present

## 2022-04-15 DIAGNOSIS — K449 Diaphragmatic hernia without obstruction or gangrene: Secondary | ICD-10-CM | POA: Diagnosis not present

## 2022-04-15 DIAGNOSIS — K760 Fatty (change of) liver, not elsewhere classified: Secondary | ICD-10-CM | POA: Diagnosis not present

## 2022-04-15 MED ORDER — GADOBUTROL 1 MMOL/ML IV SOLN
10.0000 mL | Freq: Once | INTRAVENOUS | Status: AC | PRN
  Administered 2022-04-15: 10 mL via INTRAVENOUS

## 2022-04-19 ENCOUNTER — Ambulatory Visit (INDEPENDENT_AMBULATORY_CARE_PROVIDER_SITE_OTHER): Payer: BC Managed Care – PPO | Admitting: Psychology

## 2022-04-19 DIAGNOSIS — F419 Anxiety disorder, unspecified: Secondary | ICD-10-CM | POA: Diagnosis not present

## 2022-04-19 DIAGNOSIS — F331 Major depressive disorder, recurrent, moderate: Secondary | ICD-10-CM

## 2022-04-19 NOTE — Progress Notes (Signed)
[facility] Counselor/Therapist Progress Note  Patient ID: Jacqueline Fowler, MRN: [id number],    Date: 04/19/2022  Time Spent: 60 minutes  Treatment Type: Individual Therapy  Reported Symptoms: depression  Mental Status Exam: Appearance:  Casual     Behavior: Appropriate  Motor: Normal  Speech/Language:  Normal Rate  Affect: Blunt  Mood: pleasant  Thought process: normal  Thought content:   WNL  Sensory/Perceptual disturbances:   WNL  Orientation: oriented to person, place, time/date, and situation  Attention: Good  Concentration: Good  Memory: WNL  Fund of knowledge:  Good  Insight:   Good  Judgment:  Good  Impulse Control: Good   Risk Assessment: Danger to Self:  No Self-injurious Behavior: No Danger to Others: No Duty to Warn:no Physical Aggression / Violence:No  Access to Firearms a concern: No  Gang Involvement:No   Subjective: The patient attended a face-to-face individual therapy session via video visit today.  The patient gave verbal consent for the session to be on video on WebEx.  The patient was in her home alone and the therapist was in the office.  The patient presents with a blunted affect and mood is pleasant.  The patient reports that she had an issue with her father and it seems that she was able to set a limit and speak her mind and not get caught up in having the expectation that he or his wife was going to be any different than they always are.  She was able to put in writing that she needs to have her expectations meet reality and this seems to have helped her tremendously.  We talked about how to give herself positive feedback for where she is coming from and to also utilize any kind of experiences that she has had that has been difficult as a growth stepping stone.  Encouraged her to continue to work with herself on changing her thought patterns to something more neutral or positive.  Interventions: Cognitive Behavioral  Therapy  Diagnosis:Anxiety disorder, unspecified type  Major depressive disorder, recurrent episode, moderate ([place])  Plan: Client Abilities/Strengths  Intelligent, motivated, insightful  Client Treatment Preferences  Outpatient Individual therapy  Client Statement of Needs  "I need some help with my depression"  Treatment Level  Outpatient Individual therapy  Symptoms  Depressed or irritable mood.:  (Status: maintained). Feelings of hopelessness,  worthlessness, or inappropriate guilt.: (Status: maintained). History of chronic  or recurrent depression for which the client has taken antidepressant medication, been hospitalized, had outpatient treatment, or had a course of electroconvulsive therapy.: (Status:  maintained). Lack of energy.:  (Status: maintained). Low self-esteem.:  (Status: maintained). Poor concentration and indecisiveness.: (Status: maintained). Sleeplessness or hypersomnia.: (Status:  maintained). Social withdrawal.: (Status: maintained).  Problems Addressed  Unipolar Depression, Unipolar Depression    Goals 1. Develop healthy interpersonal relationships that lead to the alleviation  and help prevent the relapse of depression. 2. Develop healthy thinking patterns and beliefs about self, others, and the world that lead to the alleviation and help prevent the relapse of  depression. Objective Verbalize an understanding of healthy and unhealthy emotions with the intent of increasing the use of  healthy emotions to guide actions. Target Date: 2023-05-02 Frequency: Weekly Progress: 30Modality: individual Objective Learn and implement behavioral strategies to overcome depression. Target Date: 2023-05-02 Frequency: Weekly Progress: 30 Modality: individual Related Interventions 1. Assist the client in developing skills that increase the likelihood of deriving pleasure from  behavioral activation (e.g., assertiveness skills, developing an exercise plan,  less  internal/more  external focus, increased social involvement); reinforce success. Objective Describe current and past experiences with depression including their impact on functioning and  attempts to resolve it. Target Date: 2023-05-02 Frequency: Weekly Progress: 30 Modality: individual Related Interventions 1. Encourage the client to share his/her thoughts and feelings of depression; express empathy and  build rapport while identifying primary cognitive, behavioral, interpersonal, or other  contributors to depression.  Objective Learn and implement problem-solving and decision-making skills. Target Date: 2023-05-02 Frequency: Weekly Progress: 30 Modality: individual Related Interventions 1. Encourage in the client the development of a positive problem orientation in which problems  and solving them are viewed as a natural part of life and not something to be feared, despaired,  or avoided. Objective Identify and replace thoughts and beliefs that support depression. Target Date: 2023-05-02 Frequency: Weekly Progress: 0 Modality: individual Related Interventions 1. Conduct Cognitive-Behavioral Therapy (see Cognitive Behavior Therapy by Olevia Bowens; Overcoming Depression by Lynita Lombard al.), beginning with helping the client learn the connection among  cognition, depressive feelings, and actions. 2. Facilitate and reinforce the client's shift from biased depressive self-talk and beliefs to realitybased cognitive messages that enhance self-confidence and increase adaptive actions (see  "Positive Self-Talk" in the Adult Psychotherapy Homework Planner by Bryn Gulling). Diagnosis Axis  none 296.32 (Major depressive affective disorder, recurrent episode, moderate) - Open -  [Signifier: n/a]  Conditions For Discharge Achievement of treatment goals and objectives Will continue to see the patient at least biweekly and work with her using CBT, Insight oriented approach and EMDR.  Patient approved the  treatment plan.  Latera Mclin G Kerra Guilfoil, LCSW                  Isabella Roemmich G Kolson Chovanec, LCSW               Shambhavi Salley G Kenidi Elenbaas, LCSW               Denicia Pagliarulo G Naydeen Speirs, LCSW               Imogene Gravelle G Avanelle Pixley, LCSW               Leonce Bale G Amreen Raczkowski, LCSW               Carlena Ruybal G Kaleiyah Polsky, LCSW               Tyrail Grandfield G Belem Hintze, LCSW               Veronika Heard G Vallory Oetken, LCSW               Leovanni Bjorkman G Jaria Conway, LCSW               Draya Felker G Kameshia Madruga, LCSW

## 2022-04-22 DIAGNOSIS — H903 Sensorineural hearing loss, bilateral: Secondary | ICD-10-CM | POA: Diagnosis not present

## 2022-04-23 ENCOUNTER — Ambulatory Visit (INDEPENDENT_AMBULATORY_CARE_PROVIDER_SITE_OTHER): Payer: BC Managed Care – PPO | Admitting: Gastroenterology

## 2022-04-23 ENCOUNTER — Other Ambulatory Visit: Payer: Self-pay | Admitting: Gastroenterology

## 2022-04-23 ENCOUNTER — Encounter: Payer: Self-pay | Admitting: Gastroenterology

## 2022-04-23 ENCOUNTER — Other Ambulatory Visit: Payer: Self-pay | Admitting: Psychiatry

## 2022-04-23 VITALS — BP 128/80 | HR 83 | Ht 66.0 in | Wt 288.6 lb

## 2022-04-23 DIAGNOSIS — K219 Gastro-esophageal reflux disease without esophagitis: Secondary | ICD-10-CM | POA: Diagnosis not present

## 2022-04-23 DIAGNOSIS — F419 Anxiety disorder, unspecified: Secondary | ICD-10-CM

## 2022-04-23 DIAGNOSIS — K7460 Unspecified cirrhosis of liver: Secondary | ICD-10-CM | POA: Diagnosis not present

## 2022-04-23 DIAGNOSIS — K449 Diaphragmatic hernia without obstruction or gangrene: Secondary | ICD-10-CM | POA: Diagnosis not present

## 2022-04-23 DIAGNOSIS — Z6841 Body Mass Index (BMI) 40.0 and over, adult: Secondary | ICD-10-CM

## 2022-04-23 NOTE — Patient Instructions (Addendum)
If you are age 51 or older, your body mass index should be between 23-30. Your Body mass index is 46.58 kg/m. If this is out of the aforementioned range listed, please consider follow up with your Primary Care Provider.  If you are age 31 or younger, your body mass index should be between 19-25. Your Body mass index is 46.58 kg/m. If this is out of the aformentioned range listed, please consider follow up with your Primary Care Provider.   ________________________________________________________  The [facility] providers would like to encourage you to use [facility] to communicate with providers for non-urgent requests or questions.  Due to long hold times on the telephone, sending your provider a message by [facility] may be a faster and more efficient way to get a response.  Please allow 48 business hours for a response.  Please remember that this is for non-urgent requests.  _______________________________________________________  Continue Protonix 2 times a day and do Pepcid at night. Continue inderal 2 times a day   A referral has been sent to the weight loss clinic. You can call them in 2 weeks if you haven't heard from them at the number provided on your AVS below   Please call to schedule Central Scheduling for an MRI in August for a September appointment at [phone]  You have been scheduled for an MRI at [facility] on       . Your appointment time is           . Please arrive to admitting (at main entrance of the hospital) 30  minutes prior to your appointment time for registration purposes. Please make certain not to have anything to eat or drink 6 hours prior to your test. In addition, if you have any metal in your body, have a pacemaker or defibrillator, please be sure to let your ordering physician know. This test typically takes 45 minutes to 1 hour to complete. Should you need to reschedule, please call [phone] to do so.   Try to lose weight gradually.  Call in 6  months to schedule follow up appointment  Please call with any questions or concerns.  Thank you,  Dr. [name]

## 2022-04-23 NOTE — Progress Notes (Signed)
Chief Complaint: FU  Referring Provider:  Bonnita Nasuti, MD      ASSESSMENT AND PLAN;   #1. Liver cirrhosis (Child's class A) on CT 04/2021 with mild splenomegaly, likely d/t NAFLD. No ETOH. No EV on EGD 06/2021.  #2. GERD with small HH and eso stricture s/p dil.  #3. IBS-D. Assoc Post-chole diarrhea, neg colon with Bx 01/2019. Stool studies + Campylobacter.  Treated with azithromycin  #4. H/O tubular adenoma 01/2019. Rpt in 5 yrs (01/2024).  #5. Incidental liver lesion 14 mm (LI-RADS 3)  with stable mesenteric adenopahy on MRI liver 04/2022  Plan: -Continue Protonix '40mg'$  po BID -pepcid '20mg'$  po QHS -Continue inderal '10mg'$  po BID (previously started d/t tachycardia) -MRI liver with and without contrast 07/2022.  -Wt loss clinic.  -Continue wt loss.  Watch calorie intake.  Avoid hepatotoxic meds. -Nonpharmacologic means of reflux control. -FU 6 months   HPI:    Jacqueline Fowler is a 51 y.o. female  For follow-up visit. Accompanied by her husband Multiple GI problems  -Reflux.  Somewhat better with Protonix twice daily, Pepcid 20 mg p.o. nightly.  Still with nocturnal symptoms.  Denies having any history suggestive of sleep apnea.  No dysphagia or odynophagia.  Has not been eating late.  -Had CT followed by MRI liver showing hepatomegaly with evidence of liver cirrhosis, steatosis, splenomegaly with small varices.  14 mm LI-RADS 3 lesion.  Recommend repeat MRI with contrast in 3 months.  Normal AFP.  -Has gained weight as below.  -Has diarrhea at baseline 2-3/day, after eating, without any nocturnal symptoms.  -No nausea, vomiting, heartburn, regurgitation, odynophagia. No significant constipation.  No melena or hematochezia. No abdominal pain.  -No H/O itching, skin lesions, easy bruisability, intake of OTC meds including diet pills, herbal medications, anabolic steroids or Tylenol. There is no H/O blood transfusions, IVDA or FH of liver disease. No jaundice, dark urine or pale  stools. No alcohol abuse.   Wt Readings from Last 3 Encounters:  04/23/22 288 lb 9.6 oz (130.9 kg)  03/12/22 281 lb 9.6 oz (127.7 kg)  11/09/21 261 lb 4 oz (118.5 kg)   Past GI work-up:  MRI liver with and without contrast 04/2022 IMPRESSION: 1. Hepatomegaly with evidence of hepatic cirrhosis and steatosis. 2. 14 mm enhancing lesion in the posterior right hepatic lobe without washout, LI-RADS 3. Consider repeat imaging in 3-6 months. 3. Splenomegaly and small varices. 4. Small hiatal hernia. 5. Stable enlarged lymph nodes in the upper abdomen.  EGD with dil 06/13/2021 - Benign-appearing esophageal stenosis. Dilated. Bx- neg EoE - Small HH - Gastritis. Bx- neg HP - Normal examined duodenum. Bx- neg celiac  CT AP with contrast 04/25/2021 1. Cirrhotic hepatic morphology with evidence of portal hypertension including splenomegaly. 2. Enlarged upper abdominal lymph nodes, nonspecific and in the setting of hepatocellular disease are possibly reactive. 3. Punctate nonobstructive left renal stone. 4. Small hiatal hernia. Enteric contrast within the distal esophagus may reflect gastroesophageal reflux or esophageal dysmotility. 5. Aortic Atherosclerosis (ICD10-I70.0).  Colonoscopy 01/2019 -Colonic polyp s/p polypectomy. Bx- SSA. Rpt in 5 yrs -Small internal hemorrhoids. -Otherwise normal colonoscopy to TI. -Neg random colon biopsies for microscopic colitis. -Neg random TI biopsies.  [place] 08/2021 1. Patent hepatic vasculature with normal velocities and directional flow. 2. Increased, slightly coarsened echogenicity of the hepatic parenchyma with nodularity hepatic contour compatible with provided history of cirrhosis. No discrete hepatic lesions though further evaluation with abdominal MRI could be performed as indicated. 3. Borderline  splenomegaly as could be seen in the setting of portal venous hypertension. No ascites. 4. Punctate (approximately 6 mm) nonobstructing left-sided  renal stone, as demonstrated on abdominal CT performed 04/2021. 5. Post cholecystectomy.  WU for liver disease 04/2021 -Nl AFP, ceruloplasmin. Neg acute hepatitis profile.  Not immune to hepatitis A/B.  Got first vaccine. -Iron saturation 15% -LFTs: Alb 4.0, AST 23, ALT 26, TB 0.4 -PT INR 1.1 -ANA +ve 1:160 -Neg celiac screen -ASMA <20 -IgG Nl  CT 04/2009 -Fatty liver Past Medical History:  Diagnosis Date   Anxiety    Depression    Gallstones 2004   GERD (gastroesophageal reflux disease)    IBS (irritable bowel syndrome)    Obesity    UTI (urinary tract infection)     Past Surgical History:  Procedure Laterality Date   BREAST BIOPSY Right    age 47 or 37 cyst that was removed   CHOLECYSTECTOMY  2004   COLONOSCOPY  08/07/2009   Small internal hemorrhoids. Otherwise normal colonoscopy to TI.    ESOPHAGOGASTRODUODENOSCOPY  05/31/2008   Esophageal stricture status post dilation. Mild gastritis.    esophagus stretching     IUD REMOVAL     REPAIR RECTOCELE     01/22/22   tummy tuck  03/20/2020   UPPER GASTROINTESTINAL ENDOSCOPY      Family History  Problem Relation Age of Onset   Depression Son    Colon cancer Neg Hx    Esophageal cancer Neg Hx    Rectal cancer Neg Hx    Stomach cancer Neg Hx     Social History   Tobacco Use   Smoking status: Former   Smokeless tobacco: Never   Tobacco comments:    quit 2001 smoking  Vaping Use   Vaping Use: Never used  Substance Use Topics   Alcohol use: Not Currently    Alcohol/week: 1.0 standard drink of alcohol    Types: 1 Glasses of wine per week    Comment: ocassionally   Drug use: Never    Current Outpatient Medications  Medication Sig Dispense Refill   AMBULATORY NON FORMULARY MEDICATION Medication Name: GI Cocktail Equal parts of bentyl, 2% viscous lidocaine and Maalox Take 29m every 4-6 hours as needed 240 mL 2   AMBULATORY NON FORMULARY MEDICATION Medication Name: Diltiazem 2%/ Lidocaine 5% Using your  index finger, apply a small amount of medication inside the rectum up to your first knuckle/joint three-four times daily until resolved. 30 g 0   brexpiprazole (REXULTI) 2 MG TABS tablet Take 1 tablet (2 mg total) by mouth daily. 90 tablet 1   busPIRone (BUSPAR) 30 MG tablet Take 1 tablet (30 mg total) by mouth 2 (two) times daily. 180 tablet 1   dicyclomine (BENTYL) 10 MG capsule Take 1 capsule 2-3 times daily 90 capsule 3   Digestive Enzymes (PAPAYA AND ENZYMES PO) Take by mouth.     famotidine (PEPCID) 20 MG tablet TAKE 1 TABLET BY MOUTH EVERYDAY AT BEDTIME 30 tablet 0   hyoscyamine (LEVBID) 0.375 MG 12 hr tablet Take 1 tablet (0.375 mg total) by mouth 2 (two) times daily. 60 tablet 6   LORazepam (ATIVAN) 0.5 MG tablet TAKE 1 TABLET (0.5 MG TOTAL) BY MOUTH DAILY AS NEEDED FOR ANXIETY (PANIC ATTACK) 15 tablet 0   methocarbamol (ROBAXIN) 500 MG tablet TAKE 1 TABLET BY MOUTH THREE TIMES A DAY AS NEEDED FOR 10 DAYS     Multiple Vitamins-Minerals (CENTRUM SILVER 50+WOMEN PO) Take by mouth.  ondansetron (ZOFRAN ODT) 4 MG disintegrating tablet Take 1 tablet (4 mg total) by mouth every 8 (eight) hours as needed for nausea or vomiting. 25 tablet 0   pantoprazole (PROTONIX) 40 MG tablet Take 1 tablet (40 mg total) by mouth 2 (two) times daily. Take half hour before breakfast and half hour before supper. 60 tablet 11   PAPAYA ENZYME PO Take by mouth.     propranolol (INDERAL) 10 MG tablet TAKE 1 TO 2 TABLETS BY MOUTH TWICE A DAY AS NEEDED FOR ANXIETY 120 tablet 1   Pyridoxine HCl (VITAMIN B-6 PO) Take by mouth.     sertraline (ZOLOFT) 100 MG tablet Take 1 tablet (100 mg total) by mouth in the morning and at bedtime. 180 tablet 1   traZODone (DESYREL) 100 MG tablet Take 1/2-1 tablet po QHS prn insomnia 30 tablet 2   VITAMIN D PO Take by mouth.     Current Facility-Administered Medications  Medication Dose Route Frequency Provider Last Rate Last Admin   0.9 %  sodium chloride infusion  500 mL  Intravenous Once [name], MD        No Known Allergies  Review of Systems:  neg     Physical Exam:    BP 128/80   Pulse 83   Ht '5\' 6"'$  (1.676 m)   Wt 288 lb 9.6 oz (130.9 kg)   LMP 05/15/2021 (Approximate)   SpO2 94%   BMI 46.58 kg/m  Wt Readings from Last 3 Encounters:  04/23/22 288 lb 9.6 oz (130.9 kg)  03/12/22 281 lb 9.6 oz (127.7 kg)  11/09/21 261 lb 4 oz (118.5 kg)   Constitutional:  Well-developed, in no acute distress. Psychiatric: Normal mood and affect. Behavior is normal. HEENT: Pupils normal.  Conjunctivae are normal. No scleral icterus. Cardiovascular: Normal rate, regular rhythm. No edema Pulmonary/chest: Effort normal and breath sounds normal. No wheezing, rales or rhonchi. Abdominal: Soft, nondistended. Nontender. Bowel sounds active throughout. There are no masses palpable. No hepatomegaly. Rectal: Deferred Neurological: Alert and oriented to person place and time. Skin: Skin is warm and dry. No rashes noted.  Data Reviewed: I have personally reviewed following labs and imaging studies  CBC:    Latest Ref Rng & Units 03/12/2022    2:22 PM 06/13/2021    8:32 AM  CBC  WBC 4.0 - 10.5 K/uL 9.1  4.9   Hemoglobin 12.0 - 15.0 g/dL 12.9  14.7   Hematocrit 36.0 - 46.0 % 39.5  43.9   Platelets 150.0 - 400.0 K/uL 254.0  194.0     CMP:    Latest Ref Rng & Units 03/12/2022    2:22 PM 06/13/2021    8:32 AM 04/26/2021    1:54 PM  CMP  Glucose 70 - 99 mg/dL 108  95    BUN 6 - 23 mg/dL 14  20    Creatinine 0.40 - 1.20 mg/dL 1.05  0.83    Sodium 135 - 145 mEq/L 137  137    Potassium 3.5 - 5.1 mEq/L 4.1  4.4    Chloride 96 - 112 mEq/L 107  103    CO2 19 - 32 mEq/L 22  22    Calcium 8.4 - 10.5 mg/dL 9.0  9.7    Total Protein 6.0 - 8.3 g/dL 7.1  7.3  6.9   Total Bilirubin 0.2 - 1.2 mg/dL 0.4  0.6  0.4   Alkaline Phos 39 - 117 U/L 76  52  78   AST 0 -  37 U/L 32  23  23   ALT 0 - 35 U/L '30  21  26      '$ Radiology Studies: MR Abdomen W Wo  Contrast  Result Date: 04/15/2022 CLINICAL DATA:  Cirrhosis, liver lesion EXAM: MRI ABDOMEN WITHOUT AND WITH CONTRAST TECHNIQUE: Multiplanar multisequence MR imaging of the abdomen was performed both before and after the administration of intravenous contrast. CONTRAST:  77m GADAVIST GADOBUTROL 1 MMOL/ML IV SOLN COMPARISON:  CT abdomen and pelvis 03/27/2022 FINDINGS: Lower chest: No acute findings. Hepatobiliary: Liver is enlarged measuring 21.6 cm in length. Evidence of hepatic steatosis as well as nodular contour suggesting cirrhosis. 14 mm lobulated hyperintense T2 signal lesion in the posterior right hepatic lobe segment 6/7 which enhances on all sequences without washout. No additional hepatic lesions identified. Gallbladder is surgically absent. No biliary ductal dilatation. Pancreas: No mass, inflammatory changes, or other parenchymal abnormality identified. Spleen:  Enlarged measuring 14.6 cm in length. Adrenals/Urinary Tract: No masses identified. No evidence of hydronephrosis. Stomach/Bowel: Small hiatal hernia. No evidence of bowel obstruction. Vascular/Lymphatic: Small varices in the upper abdomen. Stable enlarged lymph nodes in the mid upper abdomen measuring up to 1.9 cm. Other:  No ascites. Musculoskeletal: No suspicious bone lesions identified. IMPRESSION: 1. Hepatomegaly with evidence of hepatic cirrhosis and steatosis. 2. 14 mm enhancing lesion in the posterior right hepatic lobe without washout, LI-RADS 3. Consider repeat imaging in 3-6 months. 3. Splenomegaly and small varices. 4. Small hiatal hernia. 5. Stable enlarged lymph nodes in the upper abdomen. Electronically Signed   By: DOfilia NeasM.D.   On: 04/15/2022 09:09   CT Abdomen Pelvis W Contrast  Result Date: 03/28/2022 CLINICAL DATA:  Hepatic cirrhosis. EXAM: CT ABDOMEN AND PELVIS WITH CONTRAST TECHNIQUE: Multidetector CT imaging of the abdomen and pelvis was performed using the standard protocol following bolus administration  of intravenous contrast. RADIATION DOSE REDUCTION: This exam was performed according to the departmental dose-optimization program which includes automated exposure control, adjustment of the mA and/or kV according to patient size and/or use of iterative reconstruction technique. CONTRAST:  10109mOMNIPAQUE IOHEXOL 300 MG/ML  SOLN COMPARISON:  CT April 25, 2021 FINDINGS: Lower chest: No acute abnormality. Hepatobiliary: Cirrhotic hepatic morphology. Arterially enhancing 12 mm segment VI hepatic lesion on image 36/2 does not definitely demonstrate washout or capsule. No additional arterially enhancing hepatic lesions identified. Stable size of the 4 mm hypodensity in the right lobe of the liver on image 15/5 which is favored to reflect a benign hepatic cysts. Gallbladder surgically absent.  No biliary ductal dilation. Pancreas: No pancreatic ductal dilation or evidence of acute inflammation. Spleen: Mild splenomegaly measuring 13.9 cm in maximum craniocaudal dimension. Adrenals/Urinary Tract: Bilateral adrenal glands are within normal limits. No hydronephrosis. Punctate nonobstructive left lower pole renal calculus. Kidneys demonstrate symmetric enhancement and excretion of contrast material. Stomach/Bowel: Radiopaque enteric contrast material traverses the descending colon. Small hiatal hernia. Otherwise the stomach is unremarkable for degree of distension. No pathologic dilation or evidence of acute inflammation involving loops of large or small bowel in the abdomen. Vascular/Lymphatic: Aortic atherosclerosis without aneurysmal dilation. The portal, splenic and superior mesenteric veins are patent. Paraesophageal and splenorenal collateral vessels. Similar enlarged upper abdominal lymph nodes for instance a 13 mm portacaval lymph node on image 46/5 and a 19 mm periportal lymph node on image 36/5. Reproductive: Uterus and bilateral adnexa are unremarkable. Other: No significant abdominopelvic free fluid.  Musculoskeletal: No acute osseous abnormality. IMPRESSION: 1. Cirrhotic hepatic morphology with evidence of portal hypertension  including splenomegaly and paraesophageal and splenorenal collateral vessels. 2. Arterially enhancing 12 mm segment VI hepatic lesion is most consistent with a LI-RADS category 3 lesion (intermediate probability of malignancy) recommend follow-up hepatic protocol MRI or CT with and without contrast in 3-6 months to assess stability. 3. Similar enlarged upper abdominal lymph nodes, which are again favored reactive. 4. Punctate nonobstructive left lower pole renal calculus. 5. Aortic Atherosclerosis (ICD10-I70.0). Electronically Signed   By: Dahlia Bailiff M.D.   On: 03/28/2022 14:45      Carmell Austria, MD 04/23/2022, 2:22 PM  Cc: Bonnita Nasuti, MD

## 2022-04-24 ENCOUNTER — Other Ambulatory Visit: Payer: Self-pay | Admitting: Physician Assistant

## 2022-04-24 ENCOUNTER — Other Ambulatory Visit: Payer: Self-pay | Admitting: Gastroenterology

## 2022-04-24 DIAGNOSIS — R197 Diarrhea, unspecified: Secondary | ICD-10-CM

## 2022-04-25 DIAGNOSIS — Z0000 Encounter for general adult medical examination without abnormal findings: Secondary | ICD-10-CM | POA: Diagnosis not present

## 2022-04-25 DIAGNOSIS — F321 Major depressive disorder, single episode, moderate: Secondary | ICD-10-CM | POA: Diagnosis not present

## 2022-04-25 DIAGNOSIS — F329 Major depressive disorder, single episode, unspecified: Secondary | ICD-10-CM | POA: Diagnosis not present

## 2022-04-25 DIAGNOSIS — F419 Anxiety disorder, unspecified: Secondary | ICD-10-CM | POA: Diagnosis not present

## 2022-04-25 DIAGNOSIS — M150 Primary generalized (osteo)arthritis: Secondary | ICD-10-CM | POA: Diagnosis not present

## 2022-04-25 DIAGNOSIS — I10 Essential (primary) hypertension: Secondary | ICD-10-CM | POA: Diagnosis not present

## 2022-04-26 DIAGNOSIS — M545 Low back pain, unspecified: Secondary | ICD-10-CM | POA: Diagnosis not present

## 2022-04-26 DIAGNOSIS — R011 Cardiac murmur, unspecified: Secondary | ICD-10-CM | POA: Diagnosis not present

## 2022-04-29 DIAGNOSIS — C44319 Basal cell carcinoma of skin of other parts of face: Secondary | ICD-10-CM | POA: Diagnosis not present

## 2022-04-29 DIAGNOSIS — D2239 Melanocytic nevi of other parts of face: Secondary | ICD-10-CM | POA: Diagnosis not present

## 2022-05-03 ENCOUNTER — Ambulatory Visit (INDEPENDENT_AMBULATORY_CARE_PROVIDER_SITE_OTHER): Payer: BC Managed Care – PPO | Admitting: Psychology

## 2022-05-03 DIAGNOSIS — F419 Anxiety disorder, unspecified: Secondary | ICD-10-CM | POA: Diagnosis not present

## 2022-05-03 DIAGNOSIS — F5081 Binge eating disorder: Secondary | ICD-10-CM

## 2022-05-03 DIAGNOSIS — F331 Major depressive disorder, recurrent, moderate: Secondary | ICD-10-CM

## 2022-05-03 NOTE — Progress Notes (Signed)
[facility] Counselor/Therapist Progress Note  Patient ID: Jacqueline Fowler, MRN: [id number],    Date: 05/03/2022  Time Spent: 60 minutes  Treatment Type: Individual Therapy  Reported Symptoms: depression  Mental Status Exam: Appearance:  Casual     Behavior: Appropriate  Motor: Normal  Speech/Language:  Normal Rate  Affect: Blunt  Mood: pleasant  Thought process: normal  Thought content:   WNL  Sensory/Perceptual disturbances:   WNL  Orientation: oriented to person, place, time/date, and situation  Attention: Good  Concentration: Good  Memory: WNL  Fund of knowledge:  Good  Insight:   Good  Judgment:  Good  Impulse Control: Good   Risk Assessment: Danger to Self:  No Self-injurious Behavior: No Danger to Others: No Duty to Warn:no Physical Aggression / Violence:No  Access to Firearms a concern: No  Gang Involvement:No   Subjective: The patient attended a face-to-face individual therapy session via video visit today.  The patient gave verbal consent for the session to be on video on WebEx.  The patient was in her car alone and the therapist was in the office.  The patient presents with a blunted affect and mood is pleasant.  The patient was out shopping and decided to do her session in the car today.  The patient reports that she is doing okay and is smiling more.  We talked today about the trouble that she is having getting on and eating program that is healthy and taking care of herself.  We talked about the need for her to have an accountability partner and I had her promise me that she will start her eating program on Monday.  The patient states that she went to the liver specialist and they recommended that she lose weight so that if she needs a liver transplant they will do it as opposed to not doing it because she is obese.  She has had difficulty getting on an eating program and we talked about the need for her to find something that works and try it and  just commit to doing it for herself and her grandchildren.  Interventions: Cognitive Behavioral Therapy  Diagnosis:Anxiety disorder, unspecified type  Major depressive disorder, recurrent episode, moderate (HCC)  Binge eating disorder  Plan: Client Abilities/Strengths  Intelligent, motivated, insightful  Client Treatment Preferences  Outpatient Individual therapy  Client Statement of Needs  "I need some help with my depression"  Treatment Level  Outpatient Individual therapy  Symptoms  Depressed or irritable mood.:  (Status: maintained). Feelings of hopelessness,  worthlessness, or inappropriate guilt.: (Status: maintained). History of chronic  or recurrent depression for which the client has taken antidepressant medication, been hospitalized, had outpatient treatment, or had a course of electroconvulsive therapy.: (Status:  maintained). Lack of energy.:  (Status: maintained). Low self-esteem.:  (Status: maintained). Poor concentration and indecisiveness.: (Status: maintained). Sleeplessness or hypersomnia.: (Status:  maintained). Social withdrawal.: (Status: maintained).  Problems Addressed  Unipolar Depression, Unipolar Depression    Goals 1. Develop healthy interpersonal relationships that lead to the alleviation  and help prevent the relapse of depression. 2. Develop healthy thinking patterns and beliefs about self, others, and the world that lead to the alleviation and help prevent the relapse of  depression. Objective Verbalize an understanding of healthy and unhealthy emotions with the intent of increasing the use of  healthy emotions to guide actions. Target Date: 2023-05-02 Frequency: Weekly Progress: : individual Objective Learn and implement behavioral strategies to overcome depression. Target Date: 2023-05-02 Frequency: Weekly Progress: 30  Modality: individual Related Interventions 1. Assist the client in developing skills that increase the  likelihood of deriving pleasure from  behavioral activation (e.g., assertiveness skills, developing an exercise plan, less internal/more  external focus, increased social involvement); reinforce success. Objective Describe current and past experiences with depression including their impact on functioning and  attempts to resolve it. Target Date: 2023-05-02 Frequency: Weekly Progress: 30 Modality: individual Related Interventions 1. Encourage the client to share his/her thoughts and feelings of depression; express empathy and  build rapport while identifying primary cognitive, behavioral, interpersonal, or other  contributors to depression.  Objective Learn and implement problem-solving and decision-making skills. Target Date: 2023-05-02 Frequency: Weekly Progress: 30 Modality: individual Related Interventions 1. Encourage in the client the development of a positive problem orientation in which problems  and solving them are viewed as a natural part of life and not something to be feared, despaired,  or avoided. Objective Identify and replace thoughts and beliefs that support depression. Target Date: 2023-05-02 Frequency: Weekly Progress: 0 Modality: individual Related Interventions 1. Conduct Cognitive-Behavioral Therapy (see Cognitive Behavior Therapy by Reola Calkins; Overcoming Depression by Agapito Games al.), beginning with helping the client learn the connection among  cognition, depressive feelings, and actions. 2. Facilitate and reinforce the client's shift from biased depressive self-talk and beliefs to realitybased cognitive messages that enhance self-confidence and increase adaptive actions (see  "Positive Self-Talk" in the Adult Psychotherapy Homework Planner by Stephannie Li). Diagnosis Axis  none 296.32 (Major depressive affective disorder, recurrent episode, moderate) - Open -  [Signifier: n/a]  Conditions For Discharge Achievement of treatment goals and objectives Will continue to  see the patient at least biweekly and work with her using CBT, Insight oriented approach and EMDR.  Patient approved the treatment plan.  Kirat Mezquita G Renuka Farfan, LCSW                  Melisa Donofrio G Glorian Mcdonell, LCSW               Kimon Loewen G Julien Berryman, LCSW               Timon Geissinger G Esli Jernigan, LCSW               Tory Septer G Melania Kirks, LCSW               Sindee Stucker G Dorrien Grunder, LCSW               Harce Volden G Mariella Blackwelder, LCSW               Shericka Johnstone G Analiza Cowger, LCSW               Kensleigh Gates G Dan Scearce, LCSW               Eufemia Prindle G Charday Capetillo, LCSW               Thijs Brunton G Dorleen Kissel, LCSW               Calhoun Reichardt G Naiara Lombardozzi, LCSW

## 2022-05-07 ENCOUNTER — Other Ambulatory Visit: Payer: Self-pay

## 2022-05-07 ENCOUNTER — Telehealth: Payer: Self-pay | Admitting: Psychiatry

## 2022-05-07 DIAGNOSIS — F419 Anxiety disorder, unspecified: Secondary | ICD-10-CM

## 2022-05-07 MED ORDER — LORAZEPAM 0.5 MG PO TABS: 0.5000 mg | ORAL_TABLET | Freq: Every day | ORAL | 0 refills | Status: DC | PRN

## 2022-05-11 DIAGNOSIS — M545 Low back pain, unspecified: Secondary | ICD-10-CM | POA: Diagnosis not present

## 2022-05-15 DIAGNOSIS — M5451 Vertebrogenic low back pain: Secondary | ICD-10-CM | POA: Diagnosis not present

## 2022-05-15 DIAGNOSIS — M5416 Radiculopathy, lumbar region: Secondary | ICD-10-CM | POA: Diagnosis not present

## 2022-05-15 DIAGNOSIS — M47816 Spondylosis without myelopathy or radiculopathy, lumbar region: Secondary | ICD-10-CM | POA: Diagnosis not present

## 2022-05-17 ENCOUNTER — Ambulatory Visit (INDEPENDENT_AMBULATORY_CARE_PROVIDER_SITE_OTHER): Payer: BC Managed Care – PPO | Admitting: Psychology

## 2022-05-17 DIAGNOSIS — F5081 Binge eating disorder: Secondary | ICD-10-CM | POA: Diagnosis not present

## 2022-05-17 DIAGNOSIS — F50819 Binge eating disorder, unspecified: Secondary | ICD-10-CM

## 2022-05-17 DIAGNOSIS — F419 Anxiety disorder, unspecified: Secondary | ICD-10-CM

## 2022-05-17 DIAGNOSIS — F331 Major depressive disorder, recurrent, moderate: Secondary | ICD-10-CM

## 2022-05-17 NOTE — Progress Notes (Signed)
[facility] Counselor/Therapist Progress Note  Patient ID: Jacqueline Fowler, MRN: [id number],    Date: 05/17/2022  Time Spent: 60 minutes  Treatment Type: Individual Therapy  Reported Symptoms: depression  Mental Status Exam: Appearance:  Casual     Behavior: Appropriate  Motor: Normal  Speech/Language:  Normal Rate  Affect: Blunt  Mood: pleasant  Thought process: normal  Thought content:   WNL  Sensory/Perceptual disturbances:   WNL  Orientation: oriented to person, place, time/date, and situation  Attention: Good  Concentration: Good  Memory: WNL  Fund of knowledge:  Good  Insight:   Good  Judgment:  Good  Impulse Control: Good   Risk Assessment: Danger to Self:  No Self-injurious Behavior: No Danger to Others: No Duty to Warn:no Physical Aggression / Violence:No  Access to Firearms a concern: No  Gang Involvement:No   Subjective: The patient attended a face-to-face individual therapy session via video visit today.  The patient gave verbal consent for the session to be on video on WebEx.  The patient was in her home alone and the therapist was in the office.  The patient presents with a blunted affect and mood is pleasant.  The patient reports that she feels more joyful today.  The patient states that she lost over 6 pounds this past week.  We talked about the things that she has going on in her life that are good.  She tends to focus on negative things but has been doing better in the last several months about remaining more positive and focusing on the now.  The patient talked about having a sad moment because it was the anniversary date of her son's birthday and also her son's death date.  We talked about grieving in short increments and allowing herself to have those emotions but then changing her mindset as needed.  Interventions: Cognitive Behavioral Therapy  Diagnosis:Major depressive disorder, recurrent episode, moderate (HCC)  Anxiety disorder,  unspecified type  Binge eating disorder  Plan: Client Abilities/Strengths  Intelligent, motivated, insightful  Client Treatment Preferences  Outpatient Individual therapy  Client Statement of Needs  "I need some help with my depression"  Treatment Level  Outpatient Individual therapy  Symptoms  Depressed or irritable mood.:  (Status: maintained). Feelings of hopelessness,  worthlessness, or inappropriate guilt.: (Status: maintained). History of chronic  or recurrent depression for which the client has taken antidepressant medication, been hospitalized, had outpatient treatment, or had a course of electroconvulsive therapy.: (Status:  maintained). Lack of energy.:  (Status: maintained). Low self-esteem.:  (Status: maintained). Poor concentration and indecisiveness.: (Status: maintained). Sleeplessness or hypersomnia.: (Status:  maintained). Social withdrawal.: (Status: maintained).  Problems Addressed  Unipolar Depression, Unipolar Depression    Goals 1. Develop healthy interpersonal relationships that lead to the alleviation  and help prevent the relapse of depression. 2. Develop healthy thinking patterns and beliefs about self, others, and the world that lead to the alleviation and help prevent the relapse of  depression. Objective Verbalize an understanding of healthy and unhealthy emotions with the intent of increasing the use of  healthy emotions to guide actions. Target Date: 2023-05-02 Frequency: Weekly Progress: 30Modality: individual Objective Learn and implement behavioral strategies to overcome depression. Target Date: 2023-05-02 Frequency: Weekly Progress: 30 Modality: individual Related Interventions 1. Assist the client in developing skills that increase the likelihood of deriving pleasure from  behavioral activation (e.g., assertiveness skills, developing an exercise plan, less internal/more  external focus, increased social involvement); reinforce  success. Objective Describe current and  past experiences with depression including their impact on functioning and  attempts to resolve it. Target Date: 2023-05-02 Frequency: Weekly Progress: 30 Modality: individual Related Interventions 1. Encourage the client to share his/her thoughts and feelings of depression; express empathy and  build rapport while identifying primary cognitive, behavioral, interpersonal, or other  contributors to depression.  Objective Learn and implement problem-solving and decision-making skills. Target Date: 2023-05-02 Frequency: Weekly Progress: 30 Modality: individual Related Interventions 1. Encourage in the client the development of a positive problem orientation in which problems  and solving them are viewed as a natural part of life and not something to be feared, despaired,  or avoided. Objective Identify and replace thoughts and beliefs that support depression. Target Date: 2023-05-02 Frequency: Weekly Progress: 0 Modality: individual Related Interventions 1. Conduct Cognitive-Behavioral Therapy (see Cognitive Behavior Therapy by Olevia Bowens; Overcoming Depression by Lynita Lombard al.), beginning with helping the client learn the connection among  cognition, depressive feelings, and actions. 2. Facilitate and reinforce the client's shift from biased depressive self-talk and beliefs to realitybased cognitive messages that enhance self-confidence and increase adaptive actions (see  "Positive Self-Talk" in the Adult Psychotherapy Homework Planner by Bryn Gulling). Diagnosis Axis  none 296.32 (Major depressive affective disorder, recurrent episode, moderate) - Open -  [Signifier: n/a]  Conditions For Discharge Achievement of treatment goals and objectives Will continue to see the patient at least biweekly and work with her using CBT, Insight oriented approach and EMDR.  Patient approved the treatment plan.  Brier Reid G Andron Marrazzo,  LCSW                  Conchita Truxillo G Deddrick Saindon, LCSW               Rylee Huestis G Gazelle Towe, LCSW               Ricahrd Schwager G Tempest Frankland, LCSW               Copper Basnett G Tafari Humiston, LCSW               Erin Uecker G Schneider Warchol, LCSW               Sherrill Mckamie G Jasmin Trumbull, LCSW               Nazifa Trinka G Joann Jorge, LCSW               Juwana Thoreson G Ivery Nanney, LCSW               Tytiana Coles G Elvie Maines, LCSW               Aliceson Dolbow G Sidonie Dexheimer, LCSW               Crytal Pensinger G Herley Bernardini, LCSW               Yves Fodor G Aquarius Tremper, LCSW

## 2022-05-22 DIAGNOSIS — C44319 Basal cell carcinoma of skin of other parts of face: Secondary | ICD-10-CM | POA: Diagnosis not present

## 2022-05-31 ENCOUNTER — Ambulatory Visit: Payer: BC Managed Care – PPO | Admitting: Psychology

## 2022-06-03 DIAGNOSIS — Z960 Presence of urogenital implants: Secondary | ICD-10-CM | POA: Diagnosis not present

## 2022-06-03 DIAGNOSIS — R3914 Feeling of incomplete bladder emptying: Secondary | ICD-10-CM | POA: Diagnosis not present

## 2022-06-03 DIAGNOSIS — R3916 Straining to void: Secondary | ICD-10-CM | POA: Diagnosis not present

## 2022-06-03 DIAGNOSIS — N8110 Cystocele, unspecified: Secondary | ICD-10-CM | POA: Diagnosis not present

## 2022-06-05 ENCOUNTER — Ambulatory Visit (INDEPENDENT_AMBULATORY_CARE_PROVIDER_SITE_OTHER): Payer: BC Managed Care – PPO | Admitting: Psychology

## 2022-06-05 DIAGNOSIS — F331 Major depressive disorder, recurrent, moderate: Secondary | ICD-10-CM | POA: Diagnosis not present

## 2022-06-05 DIAGNOSIS — F419 Anxiety disorder, unspecified: Secondary | ICD-10-CM

## 2022-06-05 NOTE — Progress Notes (Signed)
[facility] Counselor/Therapist Progress Note  Patient ID: CUBA NATARAJAN, MRN: [id number],    Date: 06/05/2022  Time Spent: 60 minutes  Treatment Type: Individual Therapy  Reported Symptoms: depression  Mental Status Exam: Appearance:  Casual     Behavior: Appropriate  Motor: Normal  Speech/Language:  Normal Rate  Affect: Blunt  Mood: pleasant  Thought process: normal  Thought content:   WNL  Sensory/Perceptual disturbances:   WNL  Orientation: oriented to person, place, time/date, and situation  Attention: Good  Concentration: Good  Memory: WNL  Fund of knowledge:  Good  Insight:   Good  Judgment:  Good  Impulse Control: Good   Risk Assessment: Danger to Self:  No Self-injurious Behavior: No Danger to Others: No Duty to Warn:no Physical Aggression / Violence:No  Access to Firearms a concern: No  Gang Involvement:No   Subjective: The patient attended a face-to-face individual therapy session via video visit today.  The patient gave verbal consent for the session to be on video on WebEx.  The patient was in her home alone and the therapist was in the office.  The patient presents with a blunted affect and mood is pleasant.  The patient was a little more depressed today because she reports that she is having problems with her bladder prolapse again.  She also has been having some back pain.  We talked about how to deal with this and I encouraged her to take some time for herself every day to try to help herself relax so that she is not getting more and more frustrated with the pain.  The patient asked about doing EMDR and we discussed the need to figure out exactly what it is that we want to do with the EMDR and we will have this discussion at our next session.  Today I provided cognitive behavioral therapy and supportive therapy.  Interventions: Cognitive Behavioral Therapy  Diagnosis:Major depressive disorder, recurrent episode, moderate (HCC)  Anxiety  disorder, unspecified type  Plan: Client Abilities/Strengths  Intelligent, motivated, insightful  Client Treatment Preferences  Outpatient Individual therapy  Client Statement of Needs  "I need some help with my depression"  Treatment Level  Outpatient Individual therapy  Symptoms  Depressed or irritable mood.:  (Status: maintained). Feelings of hopelessness,  worthlessness, or inappropriate guilt.: (Status: maintained). History of chronic  or recurrent depression for which the client has taken antidepressant medication, been hospitalized, had outpatient treatment, or had a course of electroconvulsive therapy.: (Status:  maintained). Lack of energy.:  (Status: maintained). Low self-esteem.:  (Status: maintained). Poor concentration and indecisiveness.: (Status: maintained). Sleeplessness or hypersomnia.: (Status:  maintained). Social withdrawal.: (Status: maintained).  Problems Addressed  Unipolar Depression, Unipolar Depression    Goals 1. Develop healthy interpersonal relationships that lead to the alleviation  and help prevent the relapse of depression. 2. Develop healthy thinking patterns and beliefs about self, others, and the world that lead to the alleviation and help prevent the relapse of  depression. Objective Verbalize an understanding of healthy and unhealthy emotions with the intent of increasing the use of  healthy emotions to guide actions. Target Date: 2023-05-02 Frequency: Weekly Progress: 30Modality: individual Objective Learn and implement behavioral strategies to overcome depression. Target Date: 2023-05-02 Frequency: Weekly Progress: 30 Modality: individual Related Interventions 1. Assist the client in developing skills that increase the likelihood of deriving pleasure from  behavioral activation (e.g., assertiveness skills, developing an exercise plan, less internal/more  external focus, increased social involvement); reinforce  success. Objective Describe current and  past experiences with depression including their impact on functioning and  attempts to resolve it. Target Date: 2023-05-02 Frequency: Weekly Progress: 30 Modality: individual Related Interventions 1. Encourage the client to share his/her thoughts and feelings of depression; express empathy and  build rapport while identifying primary cognitive, behavioral, interpersonal, or other  contributors to depression.  Objective Learn and implement problem-solving and decision-making skills. Target Date: 2023-05-02 Frequency: Weekly Progress: 30 Modality: individual Related Interventions 1. Encourage in the client the development of a positive problem orientation in which problems  and solving them are viewed as a natural part of life and not something to be feared, despaired,  or avoided. Objective Identify and replace thoughts and beliefs that support depression. Target Date: 2023-05-02 Frequency: Weekly Progress: 0 Modality: individual Related Interventions 1. Conduct Cognitive-Behavioral Therapy (see Cognitive Behavior Therapy by Olevia Bowens; Overcoming Depression by Lynita Lombard al.), beginning with helping the client learn the connection among  cognition, depressive feelings, and actions. 2. Facilitate and reinforce the client's shift from biased depressive self-talk and beliefs to realitybased cognitive messages that enhance self-confidence and increase adaptive actions (see  "Positive Self-Talk" in the Adult Psychotherapy Homework Planner by Bryn Gulling). Diagnosis Axis  none 296.32 (Major depressive affective disorder, recurrent episode, moderate) - Open -  [Signifier: n/a]  Conditions For Discharge Achievement of treatment goals and objectives Will continue to see the patient at least biweekly and work with her using CBT, Insight oriented approach and EMDR.  Patient approved the treatment plan.  Medha Pippen G Estreya Clay,  LCSW                  Avrum Kimball G Casie Sturgeon, LCSW               Keyon Winnick G Rian Busche, LCSW               Dejanique Ruehl G Jordani Nunn, LCSW               Suraya Vidrine G Damaya Channing, LCSW               Dulcinea Kinser G Kandas Oliveto, LCSW               Abass Misener G Rida Loudin, LCSW               Elicia Lui G Yandell Mcjunkins, LCSW               Jozie Wulf G Tania Perrott, LCSW               Denessa Cavan G Chenell Lozon, LCSW               Jackob Crookston G Avinash Maltos, LCSW               Arleatha Philipps G Suheily Birks, LCSW               Mulki Roesler G Vinetta Brach, LCSW               Cruzita Lipa G Anthonie Lotito, LCSW

## 2022-06-11 DIAGNOSIS — M47816 Spondylosis without myelopathy or radiculopathy, lumbar region: Secondary | ICD-10-CM | POA: Diagnosis not present

## 2022-06-14 ENCOUNTER — Ambulatory Visit (INDEPENDENT_AMBULATORY_CARE_PROVIDER_SITE_OTHER): Payer: BC Managed Care – PPO | Admitting: Psychology

## 2022-06-14 DIAGNOSIS — F331 Major depressive disorder, recurrent, moderate: Secondary | ICD-10-CM | POA: Diagnosis not present

## 2022-06-14 DIAGNOSIS — F419 Anxiety disorder, unspecified: Secondary | ICD-10-CM | POA: Diagnosis not present

## 2022-06-16 NOTE — Progress Notes (Signed)
[facility] Counselor/Therapist Progress Note  Patient ID: Jacqueline Fowler, MRN: [id number],    Date: 06/14/2022  Time Spent: 60 minutes  Treatment Type: Individual Therapy  Reported Symptoms: depression  Mental Status Exam: Appearance:  Casual     Behavior: Appropriate  Motor: Normal  Speech/Language:  Normal Rate  Affect: Blunt  Mood: pleasant  Thought process: normal  Thought content:   WNL  Sensory/Perceptual disturbances:   WNL  Orientation: oriented to person, place, time/date, and situation  Attention: Good  Concentration: Good  Memory: WNL  Fund of knowledge:  Good  Insight:   Good  Judgment:  Good  Impulse Control: Good   Risk Assessment: Danger to Self:  No Self-injurious Behavior: No Danger to Others: No Duty to Warn:no Physical Aggression / Violence:No  Access to Firearms a concern: No  Gang Involvement:No   Subjective: The patient attended a face-to-face individual therapy session via video visit today.  The patient gave verbal consent for the session to be on video on WebEx.  The patient was in her home alone and the therapist was in the office.  The patient presents with a blunted affect and mood is pleasant.  We talked today about possibly doing more EMDR.  The patient reports that things seem to be going okay for her right now.  She has not been nearly as negative as she has been previously and she states that she has not asked exactly sure why.  We looked back and we did already do some EMDR and it is possible that that could be part of the reason why she is not quite as negative.  We processed some of the reasons why she is as strong as she is.  We will continue to look at what we want to do with EMDR and continue to work on helping her stay in the more neutral or positive zone. Interventions: Cognitive Behavioral Therapy  Diagnosis:Major depressive disorder, recurrent episode, moderate (HCC)  Anxiety disorder, unspecified type  Plan:  Client Abilities/Strengths  Intelligent, motivated, insightful  Client Treatment Preferences  Outpatient Individual therapy  Client Statement of Needs  "I need some help with my depression"  Treatment Level  Outpatient Individual therapy  Symptoms  Depressed or irritable mood.:  (Status: maintained). Feelings of hopelessness,  worthlessness, or inappropriate guilt.: (Status: maintained). History of chronic  or recurrent depression for which the client has taken antidepressant medication, been hospitalized, had outpatient treatment, or had a course of electroconvulsive therapy.: (Status:  maintained). Lack of energy.:  (Status: maintained). Low self-esteem.:  (Status: maintained). Poor concentration and indecisiveness.: (Status: maintained). Sleeplessness or hypersomnia.: (Status:  maintained). Social withdrawal.: (Status: maintained).  Problems Addressed  Unipolar Depression, Unipolar Depression    Goals 1. Develop healthy interpersonal relationships that lead to the alleviation  and help prevent the relapse of depression. 2. Develop healthy thinking patterns and beliefs about self, others, and the world that lead to the alleviation and help prevent the relapse of  depression. Objective Verbalize an understanding of healthy and unhealthy emotions with the intent of increasing the use of  healthy emotions to guide actions. Target Date: 2023-05-02 Frequency: Weekly Progress: 30Modality: individual Objective Learn and implement behavioral strategies to overcome depression. Target Date: 2023-05-02 Frequency: Weekly Progress: 30 Modality: individual Related Interventions 1. Assist the client in developing skills that increase the likelihood of deriving pleasure from  behavioral activation (e.g., assertiveness skills, developing an exercise plan, less internal/more  external focus, increased social involvement); reinforce success. Objective Describe  current and past experiences with  depression including their impact on functioning and  attempts to resolve it. Target Date: 2023-05-02 Frequency: Weekly Progress: 30 Modality: individual Related Interventions 1. Encourage the client to share his/her thoughts and feelings of depression; express empathy and  build rapport while identifying primary cognitive, behavioral, interpersonal, or other  contributors to depression.  Objective Learn and implement problem-solving and decision-making skills. Target Date: 2023-05-02 Frequency: Weekly Progress: 30 Modality: individual Related Interventions 1. Encourage in the client the development of a positive problem orientation in which problems  and solving them are viewed as a natural part of life and not something to be feared, despaired,  or avoided. Objective Identify and replace thoughts and beliefs that support depression. Target Date: 2023-05-02 Frequency: Weekly Progress: 0 Modality: individual Related Interventions 1. Conduct Cognitive-Behavioral Therapy (see Cognitive Behavior Therapy by Olevia Bowens; Overcoming Depression by Lynita Lombard al.), beginning with helping the client learn the connection among  cognition, depressive feelings, and actions. 2. Facilitate and reinforce the client's shift from biased depressive self-talk and beliefs to realitybased cognitive messages that enhance self-confidence and increase adaptive actions (see  "Positive Self-Talk" in the Adult Psychotherapy Homework Planner by Bryn Gulling). Diagnosis Axis  none 296.32 (Major depressive affective disorder, recurrent episode, moderate) - Open -  [Signifier: n/a]  Conditions For Discharge Achievement of treatment goals and objectives Will continue to see the patient at least biweekly and work with her using CBT, Insight oriented approach and EMDR.  Patient approved the treatment plan.  Gaylen Venning G Sandeep Delagarza, LCSW                  Conrad Zajkowski G Gilliam Hawkes, LCSW               Haley Fuerstenberg G  Vlasta Baskin, LCSW               Sarah Zerby G Brin Ruggerio, LCSW               Antigone Crowell G Osiel Stick, LCSW               Maevis Mumby G Laterrance Nauta, LCSW               Nikki Glanzer G Bronson Bressman, LCSW               Irish Piech G Milik Gilreath, LCSW               Niyati Heinke G Broderic Bara, LCSW               Shyhiem Beeney G Oris Calmes, LCSW               Finneus Kaneshiro G Derrik Mceachern, LCSW               Adilynne Fitzwater G Elihu Milstein, LCSW               Isola Mehlman G Jame Morrell, LCSW               Syniah Berne G Jazilyn Siegenthaler, LCSW               Paislea Hatton G Nolon Yellin, LCSW

## 2022-06-21 DIAGNOSIS — J019 Acute sinusitis, unspecified: Secondary | ICD-10-CM | POA: Diagnosis not present

## 2022-06-21 DIAGNOSIS — J101 Influenza due to other identified influenza virus with other respiratory manifestations: Secondary | ICD-10-CM | POA: Diagnosis not present

## 2022-06-21 DIAGNOSIS — J029 Acute pharyngitis, unspecified: Secondary | ICD-10-CM | POA: Diagnosis not present

## 2022-06-21 DIAGNOSIS — U071 COVID-19: Secondary | ICD-10-CM | POA: Diagnosis not present

## 2022-06-21 DIAGNOSIS — B974 Respiratory syncytial virus as the cause of diseases classified elsewhere: Secondary | ICD-10-CM | POA: Diagnosis not present

## 2022-06-21 DIAGNOSIS — Z20828 Contact with and (suspected) exposure to other viral communicable diseases: Secondary | ICD-10-CM | POA: Diagnosis not present

## 2022-06-25 DIAGNOSIS — M21612 Bunion of left foot: Secondary | ICD-10-CM | POA: Diagnosis not present

## 2022-06-25 DIAGNOSIS — M2042 Other hammer toe(s) (acquired), left foot: Secondary | ICD-10-CM | POA: Diagnosis not present

## 2022-06-27 DIAGNOSIS — M47816 Spondylosis without myelopathy or radiculopathy, lumbar region: Secondary | ICD-10-CM | POA: Diagnosis not present

## 2022-06-27 DIAGNOSIS — M5451 Vertebrogenic low back pain: Secondary | ICD-10-CM | POA: Diagnosis not present

## 2022-06-28 ENCOUNTER — Ambulatory Visit (INDEPENDENT_AMBULATORY_CARE_PROVIDER_SITE_OTHER): Payer: BC Managed Care – PPO | Admitting: Psychology

## 2022-06-28 DIAGNOSIS — F50819 Binge eating disorder, unspecified: Secondary | ICD-10-CM

## 2022-06-28 DIAGNOSIS — F331 Major depressive disorder, recurrent, moderate: Secondary | ICD-10-CM

## 2022-06-28 DIAGNOSIS — F419 Anxiety disorder, unspecified: Secondary | ICD-10-CM

## 2022-06-28 DIAGNOSIS — F5081 Binge eating disorder: Secondary | ICD-10-CM | POA: Diagnosis not present

## 2022-06-28 NOTE — Progress Notes (Signed)
[facility] Counselor/Therapist Progress Note  Patient ID: Jacqueline Fowler, MRN: [id number],    Date: 06/28/2022  Time Spent: 60 minutes  Treatment Type: Individual Therapy  Reported Symptoms: depression  Mental Status Exam: Appearance:  Casual     Behavior: Appropriate  Motor: Normal  Speech/Language:  Normal Rate  Affect: Blunt  Mood: pleasant  Thought process: normal  Thought content:   WNL  Sensory/Perceptual disturbances:   WNL  Orientation: oriented to person, place, time/date, and situation  Attention: Good  Concentration: Good  Memory: WNL  Fund of knowledge:  Good  Insight:   Good  Judgment:  Good  Impulse Control: Good   Risk Assessment: Danger to Self:  No Self-injurious Behavior: No Danger to Others: No Duty to Warn:no Physical Aggression / Violence:No  Access to Firearms a concern: No  Gang Involvement:No   Subjective: The patient attended a face-to-face individual therapy session via video visit today.  The patient gave verbal consent for the session to be on video on WebEx.  The patient was in her home alone and the therapist was in the office.  The patient presents with a blunted affect and mood is pleasant.  The patient talked about having some health issues and needing to get a second opinion from another back doctor concerning her back pain.  We talked about problem solving some of her health issues during the first part of the session.  During the last part of the session we started talking about different incidents that we need to target with the EMDR to help desensitize her further.  The patient is doing so much better with therapy and is not nearly as reactive or going back in the past as she has been.  We will continue to identify during the next session what things we would like to target and start working with EMDR as patient can tolerate.  Interventions: Cognitive Behavioral Therapy  Diagnosis:Major depressive disorder, recurrent  episode, moderate (HCC)  Anxiety disorder, unspecified type  Binge eating disorder  Plan: Client Abilities/Strengths  Intelligent, motivated, insightful  Client Treatment Preferences  Outpatient Individual therapy  Client Statement of Needs  "I need some help with my depression"  Treatment Level  Outpatient Individual therapy  Symptoms  Depressed or irritable mood.:  (Status: maintained). Feelings of hopelessness,  worthlessness, or inappropriate guilt.: (Status: maintained). History of chronic  or recurrent depression for which the client has taken antidepressant medication, been hospitalized, had outpatient treatment, or had a course of electroconvulsive therapy.: (Status:  maintained). Lack of energy.:  (Status: maintained). Low self-esteem.:  (Status: maintained). Poor concentration and indecisiveness.: (Status: maintained). Sleeplessness or hypersomnia.: (Status:  maintained). Social withdrawal.: (Status: maintained).  Problems Addressed  Unipolar Depression, Unipolar Depression    Goals 1. Develop healthy interpersonal relationships that lead to the alleviation  and help prevent the relapse of depression. 2. Develop healthy thinking patterns and beliefs about self, others, and the world that lead to the alleviation and help prevent the relapse of  depression. Objective Verbalize an understanding of healthy and unhealthy emotions with the intent of increasing the use of  healthy emotions to guide actions. Target Date: 2023-05-02 Frequency: Weekly Progress: 40Modality: individual Objective Learn and implement behavioral strategies to overcome depression. Target Date: 2023-05-02 Frequency: Weekly Progress: 40 Modality: individual Related Interventions 1. Assist the client in developing skills that increase the likelihood of deriving pleasure from  behavioral activation (e.g., assertiveness skills, developing an exercise plan, less internal/more  external focus, increased  social  involvement); reinforce success. Objective Describe current and past experiences with depression including their impact on functioning and  attempts to resolve it. Target Date: 2023-05-02 Frequency: Weekly Progress: 40 Modality: individual Related Interventions 1. Encourage the client to share his/her thoughts and feelings of depression; express empathy and  build rapport while identifying primary cognitive, behavioral, interpersonal, or other  contributors to depression.  Objective Learn and implement problem-solving and decision-making skills. Target Date: 2023-05-02 Frequency: Weekly Progress: 40 Modality: individual Related Interventions 1. Encourage in the client the development of a positive problem orientation in which problems  and solving them are viewed as a natural part of life and not something to be feared, despaired,  or avoided. Objective Identify and replace thoughts and beliefs that support depression. Target Date: 2023-05-02 Frequency: Weekly Progress: 10 Modality: individual Related Interventions 1. Conduct Cognitive-Behavioral Therapy (see Cognitive Behavior Therapy by Olevia Bowens; Overcoming Depression by Lynita Lombard al.), beginning with helping the client learn the connection among  cognition, depressive feelings, and actions. 2. Facilitate and reinforce the client's shift from biased depressive self-talk and beliefs to realitybased cognitive messages that enhance self-confidence and increase adaptive actions (see  "Positive Self-Talk" in the Adult Psychotherapy Homework Planner by Bryn Gulling). Diagnosis Axis  none 296.32 (Major depressive affective disorder, recurrent episode, moderate) - Open -  [Signifier: n/a]  Conditions For Discharge Achievement of treatment goals and objectives Will continue to see the patient at least biweekly and work with her using CBT, Insight oriented approach and EMDR.  Patient approved the treatment plan.  Perseus Westall G Rukia Mcgillivray,  LCSW                  Asha Grumbine G Jovane Foutz, LCSW               Carlyle Mcelrath G Ashleigh Arya, LCSW               Miguel Medal G Savanah Bayles, LCSW               Madissen Wyse G Benjamen Koelling, LCSW               Kemiya Batdorf G Leeland Lovelady, LCSW               Algernon Mundie G Jenasis Straley, LCSW               Labrandon Knoch [employer], LCSW               Jordin Dambrosio G Tudor Chandley, LCSW               Theordore Cisnero G Trudie Cervantes, LCSW               Khalani Novoa G Sonora Catlin, LCSW               Evann Koelzer G Loreley Schwall, LCSW               Linnette Panella G Maguadalupe Lata, LCSW               Kaysie Michelini G Fionna Merriott, LCSW               Jaan Fischel G Zyhir Cappella, LCSW               Aayush Gelpi G  [place], LCSW

## 2022-07-03 DIAGNOSIS — D376 Neoplasm of uncertain behavior of liver, gallbladder and bile ducts: Secondary | ICD-10-CM | POA: Diagnosis not present

## 2022-07-03 DIAGNOSIS — K7469 Other cirrhosis of liver: Secondary | ICD-10-CM | POA: Diagnosis not present

## 2022-07-04 ENCOUNTER — Encounter: Payer: Self-pay | Admitting: Gastroenterology

## 2022-07-05 DIAGNOSIS — G8929 Other chronic pain: Secondary | ICD-10-CM | POA: Diagnosis not present

## 2022-07-10 DIAGNOSIS — M62838 Other muscle spasm: Secondary | ICD-10-CM | POA: Diagnosis not present

## 2022-07-10 DIAGNOSIS — R3916 Straining to void: Secondary | ICD-10-CM | POA: Diagnosis not present

## 2022-07-10 DIAGNOSIS — M6281 Muscle weakness (generalized): Secondary | ICD-10-CM | POA: Diagnosis not present

## 2022-07-10 DIAGNOSIS — N9489 Other specified conditions associated with female genital organs and menstrual cycle: Secondary | ICD-10-CM | POA: Diagnosis not present

## 2022-07-10 DIAGNOSIS — R3914 Feeling of incomplete bladder emptying: Secondary | ICD-10-CM | POA: Diagnosis not present

## 2022-07-12 ENCOUNTER — Ambulatory Visit (INDEPENDENT_AMBULATORY_CARE_PROVIDER_SITE_OTHER): Payer: BC Managed Care – PPO | Admitting: Psychology

## 2022-07-12 DIAGNOSIS — F331 Major depressive disorder, recurrent, moderate: Secondary | ICD-10-CM | POA: Diagnosis not present

## 2022-07-12 DIAGNOSIS — F419 Anxiety disorder, unspecified: Secondary | ICD-10-CM

## 2022-07-14 NOTE — Progress Notes (Signed)
[facility] Counselor/Therapist Progress Note  Patient ID: Jacqueline Fowler, MRN: [id number],    Date: 07/12/2022  Time Spent: 60 minutes  Treatment Type: Individual Therapy  Reported Symptoms: depression  Mental Status Exam: Appearance:  Casual     Behavior: Appropriate  Motor: Normal  Speech/Language:  Normal Rate  Affect: Blunt  Mood: pleasant  Thought process: normal  Thought content:   WNL  Sensory/Perceptual disturbances:   WNL  Orientation: oriented to person, place, time/date, and situation  Attention: Good  Concentration: Good  Memory: WNL  Fund of knowledge:  Good  Insight:   Good  Judgment:  Good  Impulse Control: Good   Risk Assessment: Danger to Self:  No Self-injurious Behavior: No Danger to Others: No Duty to Warn:no Physical Aggression / Violence:No  Access to Firearms a concern: No  Gang Involvement:No   Subjective: The patient attended a face-to-face individual therapy session via video visit today.  The patient gave verbal consent for the session to be on video on WebEx.  The patient was in her home alone and the therapist was in the office.  The patient presents with a blunted affect and mood is pleasant.  The patient reports that she has been doing okay.  The patient talked about her health issues and not understanding why she is struggling so much.  We talked about some tools that she can use to help herself feel more settled.  We discussed the importance of routine and also mindfulness and meditation on her moods and ability to cope.  The patient seems to understand the concepts discussed.  Asked patient to work on getting herself on a routine and to work the meditation and mindfulness in on a regular basis.  Interventions: Cognitive Behavioral Therapy  Diagnosis:Major depressive disorder, recurrent episode, moderate (HCC)  Anxiety disorder, unspecified type  Plan: Client Abilities/Strengths  Intelligent, motivated, insightful   Client Treatment Preferences  Outpatient Individual therapy  Client Statement of Needs  "I need some help with my depression"  Treatment Level  Outpatient Individual therapy  Symptoms  Depressed or irritable mood.:  (Status: maintained). Feelings of hopelessness,  worthlessness, or inappropriate guilt.: (Status: maintained). History of chronic  or recurrent depression for which the client has taken antidepressant medication, been hospitalized, had outpatient treatment, or had a course of electroconvulsive therapy.: (Status:  maintained). Lack of energy.:  (Status: maintained). Low self-esteem.:  (Status: maintained). Poor concentration and indecisiveness.: (Status: maintained). Sleeplessness or hypersomnia.: (Status:  maintained). Social withdrawal.: (Status: maintained).  Problems Addressed  Unipolar Depression, Unipolar Depression    Goals 1. Develop healthy interpersonal relationships that lead to the alleviation  and help prevent the relapse of depression. 2. Develop healthy thinking patterns and beliefs about self, others, and the world that lead to the alleviation and help prevent the relapse of  depression. Objective Verbalize an understanding of healthy and unhealthy emotions with the intent of increasing the use of  healthy emotions to guide actions. Target Date: 2023-05-02 Frequency: Weekly Progress: 40Modality: individual Objective Learn and implement behavioral strategies to overcome depression. Target Date: 2023-05-02 Frequency: Weekly Progress: 40 Modality: individual Related Interventions 1. Assist the client in developing skills that increase the likelihood of deriving pleasure from  behavioral activation (e.g., assertiveness skills, developing an exercise plan, less internal/more  external focus, increased social involvement); reinforce success. Objective Describe current and past experiences with depression including their impact on functioning and  attempts  to resolve it. Target Date: 2023-05-02 Frequency: Weekly Progress: 40 Modality: individual  Related Interventions 1. Encourage the client to share his/her thoughts and feelings of depression; express empathy and  build rapport while identifying primary cognitive, behavioral, interpersonal, or other  contributors to depression.  Objective Learn and implement problem-solving and decision-making skills. Target Date: 2023-05-02 Frequency: Weekly Progress: 40 Modality: individual Related Interventions 1. Encourage in the client the development of a positive problem orientation in which problems  and solving them are viewed as a natural part of life and not something to be feared, despaired,  or avoided. Objective Identify and replace thoughts and beliefs that support depression. Target Date: 2023-05-02 Frequency: Weekly Progress: 10 Modality: individual Related Interventions 1. Conduct Cognitive-Behavioral Therapy (see Cognitive Behavior Therapy by Olevia Bowens; Overcoming Depression by Lynita Lombard al.), beginning with helping the client learn the connection among  cognition, depressive feelings, and actions. 2. Facilitate and reinforce the client's shift from biased depressive self-talk and beliefs to realitybased cognitive messages that enhance self-confidence and increase adaptive actions (see  "Positive Self-Talk" in the Adult Psychotherapy Homework Planner by Bryn Gulling). Diagnosis Axis  none 296.32 (Major depressive affective disorder, recurrent episode, moderate) - Open -  [Signifier: n/a]  Conditions For Discharge Achievement of treatment goals and objectives Will continue to see the patient at least biweekly and work with her using CBT, Insight oriented approach and EMDR.  Patient approved the treatment plan.  Tyaira Heward G Maylen Waltermire, LCSW                  Tniyah Nakagawa G Baylea Milburn, LCSW               Royale Lennartz G Trampas Stettner, LCSW               Rakel Junio G Keshona Kartes,  LCSW               Cleotha Tsang G Parv Manthey, LCSW               Renard Caperton G Starlin Steib, LCSW               Hana Trippett G Susanne Baumgarner, LCSW               Jodine Muchmore [employer], LCSW               Camara Rosander [employer], LCSW               Haille Pardi G Treacy Holcomb, LCSW               Nichael Ehly G Jawad Wiacek, LCSW               Melaina Howerton G Geet Hosking, LCSW               Maliq Pilley G Raider Valbuena, LCSW               Saintclair Schroader G Zafir Schauer, LCSW               Maygan Koeller G Catrina Fellenz, LCSW               Mikel Pyon G Domenick Quebedeaux, LCSW               Gradie Butrick G Elleni Mozingo, LCSW

## 2022-07-16 ENCOUNTER — Encounter: Payer: Self-pay | Admitting: Gastroenterology

## 2022-07-16 ENCOUNTER — Ambulatory Visit (HOSPITAL_COMMUNITY)
Admission: RE | Admit: 2022-07-16 | Discharge: 2022-07-16 | Disposition: A | Discharge status: Home / Self Care | Payer: BC Managed Care – PPO | Source: Ambulatory Visit | Attending: Gastroenterology | Admitting: Gastroenterology

## 2022-07-16 DIAGNOSIS — K7460 Unspecified cirrhosis of liver: Secondary | ICD-10-CM | POA: Insufficient documentation

## 2022-07-16 DIAGNOSIS — R161 Splenomegaly, not elsewhere classified: Secondary | ICD-10-CM | POA: Diagnosis not present

## 2022-07-16 DIAGNOSIS — K449 Diaphragmatic hernia without obstruction or gangrene: Secondary | ICD-10-CM | POA: Insufficient documentation

## 2022-07-16 DIAGNOSIS — K219 Gastro-esophageal reflux disease without esophagitis: Secondary | ICD-10-CM | POA: Diagnosis not present

## 2022-07-16 DIAGNOSIS — I700 Atherosclerosis of aorta: Secondary | ICD-10-CM | POA: Diagnosis not present

## 2022-07-16 DIAGNOSIS — K760 Fatty (change of) liver, not elsewhere classified: Secondary | ICD-10-CM | POA: Diagnosis not present

## 2022-07-16 MED ORDER — GADOBUTROL 1 MMOL/ML IV SOLN
10.0000 mL | Freq: Once | INTRAVENOUS | Status: AC | PRN
  Administered 2022-07-16: 10 mL via INTRAVENOUS

## 2022-07-18 NOTE — Telephone Encounter (Signed)
Spoke with Pt. Documented under result notes 

## 2022-07-26 ENCOUNTER — Ambulatory Visit (INDEPENDENT_AMBULATORY_CARE_PROVIDER_SITE_OTHER): Payer: BC Managed Care – PPO | Admitting: Psychology

## 2022-07-26 DIAGNOSIS — F331 Major depressive disorder, recurrent, moderate: Secondary | ICD-10-CM

## 2022-07-26 DIAGNOSIS — F419 Anxiety disorder, unspecified: Secondary | ICD-10-CM | POA: Diagnosis not present

## 2022-07-28 NOTE — Progress Notes (Addendum)
[place] Counselor/Therapist Progress Note  Patient ID: Jacqueline Fowler, MRN: [id number],    Date: 07/26/2022  Time Spent: 60 minutes  Treatment Type: Individual Therapy  Reported Symptoms: depression  Mental Status Exam: Appearance:  Casual     Behavior: Appropriate  Motor: Normal  Speech/Language:  Normal Rate  Affect: Blunt  Mood: pleasant  Thought process: normal  Thought content:   WNL  Sensory/Perceptual disturbances:   WNL  Orientation: oriented to person, place, time/date, and situation  Attention: Good  Concentration: Good  Memory: WNL  Fund of knowledge:  Good  Insight:   Good  Judgment:  Good  Impulse Control: Good   Risk Assessment: Danger to Self:  No Self-injurious Behavior: No Danger to Others: No Duty to Warn:no Physical Aggression / Violence:No  Access to Firearms a concern: No  Gang Involvement:No   Subjective: The patient attended a face-to-face individual therapy session via video visit today.  The patient gave verbal consent for the session to be on video on WebEx.  The patient was in her home alone and the therapist was in the office.  The patient presents with a blunted affect and mood is pleasant.  The patient talked about a situation with a person that she thought was her friend.  The patient did what she normally does and she says that something must be wrong with her.  We talked about it not always being something that she is doing  and we discussed that sometimes people do not like having limits set with them.  The patient understood the concepts discussed.  Also recommended that the patient read the book, Boundaries after a Pathological Relationship.    Interventions: Cognitive Behavioral Therapy  Diagnosis:No diagnosis found.  Plan: Client Abilities/Strengths  Intelligent, motivated, insightful  Client Treatment Preferences  Outpatient Individual therapy  Client Statement of Needs  "I need some help with my  depression"  Treatment Level  Outpatient Individual therapy  Symptoms  Depressed or irritable mood.:  (Status: Improved). Feelings of hopelessness,  worthlessness, or inappropriate guilt.: (Status: maintained). History of chronic  or recurrent depression for which the client has taken antidepressant medication, been hospitalized, had outpatient treatment, or had a course of electroconvulsive therapy.: (Status:  maintained). Lack of energy.:  (Status: maintained). Low self-esteem.:  (Status: maintained). Poor concentration and indecisiveness.: (Status: maintained). Sleeplessness or hypersomnia.: (Status:  maintained). Social withdrawal.: (Status: maintained).  Problems Addressed  Unipolar Depression, Unipolar Depression    Goals 1. Develop healthy interpersonal relationships that lead to the alleviation  and help prevent the relapse of depression. 2. Develop healthy thinking patterns and beliefs about self, others, and the world that lead to the alleviation and help prevent the relapse of  depression. Objective Verbalize an understanding of healthy and unhealthy emotions with the intent of increasing the use of  healthy emotions to guide actions. Target Date: 2023-05-02 Frequency: Weekly Progress: 40Modality: individual Objective Learn and implement behavioral strategies to overcome depression. Target Date: 2023-05-02 Frequency: Weekly Progress: 40 Modality: individual Related Interventions 1. Assist the client in developing skills that increase the likelihood of deriving pleasure from  behavioral activation (e.g., assertiveness skills, developing an exercise plan, less internal/more  external focus, increased social involvement); reinforce success. Objective Describe current and past experiences with depression including their impact on functioning and  attempts to resolve it. Target Date: 2023-05-02 Frequency: Weekly Progress: 40 Modality: individual Related Interventions 1.  Encourage the client to share his/her thoughts and feelings  of depression; express empathy and  build rapport while identifying primary cognitive, behavioral, interpersonal, or other  contributors to depression.  Objective Learn and implement problem-solving and decision-making skills. Target Date: 2023-05-02 Frequency: Weekly Progress: 40 Modality: individual Related Interventions 1. Encourage in the client the development of a positive problem orientation in which problems  and solving them are viewed as a natural part of life and not something to be feared, despaired,  or avoided. Objective Identify and replace thoughts and beliefs that support depression. Target Date: 2023-05-02 Frequency: Weekly Progress: 10 Modality: individual Related Interventions 1. Conduct Cognitive-Behavioral Therapy (see Cognitive Behavior Therapy by Olevia Bowens; Overcoming Depression by Lynita Lombard al.), beginning with helping the client learn the connection among  cognition, depressive feelings, and actions. 2. Facilitate and reinforce the client's shift from biased depressive self-talk and beliefs to realitybased cognitive messages that enhance self-confidence and increase adaptive actions (see  "Positive Self-Talk" in the Adult Psychotherapy Homework Planner by Bryn Gulling). Diagnosis Axis  none 296.32 (Major depressive affective disorder, recurrent episode, moderate) - Open -  [Signifier: n/a]  Conditions For Discharge Achievement of treatment goals and objectives Will continue to see the patient at least biweekly and work with her using CBT, Insight oriented approach and EMDR.  Patient approved the treatment plan.  Robbin Loughmiller G Carmalita Wakefield, LCSW

## 2022-07-29 DIAGNOSIS — R338 Other retention of urine: Secondary | ICD-10-CM | POA: Diagnosis not present

## 2022-07-31 ENCOUNTER — Encounter (INDEPENDENT_AMBULATORY_CARE_PROVIDER_SITE_OTHER): Payer: Self-pay | Admitting: Family Medicine

## 2022-07-31 ENCOUNTER — Ambulatory Visit (INDEPENDENT_AMBULATORY_CARE_PROVIDER_SITE_OTHER): Payer: BC Managed Care – PPO | Admitting: Family Medicine

## 2022-07-31 VITALS — BP 119/75 | HR 72 | Temp 97.9°F | Ht 65.0 in | Wt 280.0 lb

## 2022-07-31 DIAGNOSIS — Z0289 Encounter for other administrative examinations: Secondary | ICD-10-CM

## 2022-07-31 DIAGNOSIS — E66813 Obesity, class 3: Secondary | ICD-10-CM | POA: Insufficient documentation

## 2022-07-31 DIAGNOSIS — R5383 Other fatigue: Secondary | ICD-10-CM

## 2022-07-31 NOTE — Progress Notes (Signed)
Office: [phone]  /  Fax: [phone]  Initial Visit  Jacqueline Fowler was seen in clinic today to evaluate for obesity. She is interested in losing weight to improve overall health and reduce the risk of weight related complications.   She presents today to review program treatment options, initial physical assessment, and evaluation.      Past medical history includes:   Past Medical History:  Diagnosis Date   Anxiety    Depression    Gallstones 2004   GERD (gastroesophageal reflux disease)    IBS (irritable bowel syndrome)    Obesity    UTI (urinary tract infection)      Objective:   BP 119/75   Pulse 72   Temp 97.9 F (36.6 C)   Ht '5\' 5"'$  (1.651 m)   Wt 280 lb (127 kg)   LMP 05/15/2021 (Approximate)   SpO2 96%   BMI 46.59 kg/m  She was weighed on the bioimpedance scale:  Body mass index is 46.59 kg/m.  General:  Alert, oriented and cooperative. Patient is in no acute distress.  Respiratory: Normal respiratory effort, no problems with respiration noted  Extremities: Normal range of motion.    Mental Status: Normal mood and affect. Normal behavior. Normal judgment and thought content.   Assessment and Plan:  1. Other fatigue - EKG 12-Lead  2. Class 3 severe obesity with serious comorbidity and body mass index (BMI) of 45.0 to 49.9 in adult, unspecified obesity type ([place])      Obesity Treatment Plan:  She will work on garnering support from family and friends to begin weight loss journey. Work on eliminating or reducing the presence of highly processed, calorie dense foods in the home. Complete provided nutritional and psychosocial assessment questionnaire.    Jacqueline Fowler will follow up in the next 1-2 weeks to review the above steps and continue evaluation and treatment.  Obesity Education Performed Today:  She was weighed on the bioimpedance scale and results were discussed and documented in the synopsis.  We discussed obesity as a disease and the  importance of a more detailed evaluation of all the factors contributing to the disease.  We discussed the importance of long term lifestyle changes which include nutrition, exercise and behavioral modifications as well as the importance of customizing this to her specific health and social needs.  We discussed the benefits of reaching a healthier weight to alleviate the symptoms of existing conditions and reduce the risks of the biomechanical, metabolic and psychological effects of obesity.  We discussed the goals of this program is to improve her overall health and not simply achieve a specific BMI.  Frequent visits are very important to patient success. I plan to see her every 2 weeks for the first 3 months and then evaluate the visit frequency after that time. I explained obesity is a life-long chronic disease and long term treatments would be required. Medications to help her follow his eating plan may be offered as appropriate but are not required. All medication decisions will be made together after the initial workup is done and benefits and side effects are discussed in depth.  The clinic rules were reviewed including the late policy, cancellation policy, no show and program fees.  Jacqueline Fowler appears to be in the action stage of change and states they are ready to start intensive lifestyle modifications and behavioral modifications.  62 minutes was spent today on this visit including the above counseling, pre-visit chart review, and post-visit  documentation.  Dennard Nip, MD

## 2022-08-05 DIAGNOSIS — Z0289 Encounter for other administrative examinations: Secondary | ICD-10-CM

## 2022-08-07 DIAGNOSIS — M545 Low back pain, unspecified: Secondary | ICD-10-CM | POA: Diagnosis not present

## 2022-08-07 DIAGNOSIS — M6281 Muscle weakness (generalized): Secondary | ICD-10-CM | POA: Diagnosis not present

## 2022-08-09 ENCOUNTER — Ambulatory Visit: Payer: BC Managed Care – PPO | Admitting: Psychology

## 2022-08-09 DIAGNOSIS — M6281 Muscle weakness (generalized): Secondary | ICD-10-CM | POA: Diagnosis not present

## 2022-08-09 DIAGNOSIS — M545 Low back pain, unspecified: Secondary | ICD-10-CM | POA: Diagnosis not present

## 2022-08-13 DIAGNOSIS — M6281 Muscle weakness (generalized): Secondary | ICD-10-CM | POA: Diagnosis not present

## 2022-08-13 DIAGNOSIS — M545 Low back pain, unspecified: Secondary | ICD-10-CM | POA: Diagnosis not present

## 2022-08-14 ENCOUNTER — Ambulatory Visit (INDEPENDENT_AMBULATORY_CARE_PROVIDER_SITE_OTHER): Payer: BC Managed Care – PPO | Admitting: Family Medicine

## 2022-08-14 ENCOUNTER — Encounter (INDEPENDENT_AMBULATORY_CARE_PROVIDER_SITE_OTHER): Payer: Self-pay | Admitting: Family Medicine

## 2022-08-14 VITALS — BP 118/84 | HR 78 | Temp 98.7°F | Ht 65.0 in | Wt 279.0 lb

## 2022-08-14 DIAGNOSIS — E559 Vitamin D deficiency, unspecified: Secondary | ICD-10-CM

## 2022-08-14 DIAGNOSIS — R7303 Prediabetes: Secondary | ICD-10-CM | POA: Diagnosis not present

## 2022-08-14 DIAGNOSIS — Z6841 Body Mass Index (BMI) 40.0 and over, adult: Secondary | ICD-10-CM

## 2022-08-14 DIAGNOSIS — Z1331 Encounter for screening for depression: Secondary | ICD-10-CM | POA: Diagnosis not present

## 2022-08-14 DIAGNOSIS — R0602 Shortness of breath: Secondary | ICD-10-CM | POA: Diagnosis not present

## 2022-08-14 DIAGNOSIS — E538 Deficiency of other specified B group vitamins: Secondary | ICD-10-CM

## 2022-08-14 DIAGNOSIS — R5383 Other fatigue: Secondary | ICD-10-CM | POA: Diagnosis not present

## 2022-08-16 LAB — CBC WITH DIFFERENTIAL/PLATELET
Basophils Absolute: 0 10*3/uL (ref 0.0–0.2)
Basos: 0 %
EOS (ABSOLUTE): 0.1 10*3/uL (ref 0.0–0.4)
Eos: 3 %
Hematocrit: 42.7 % (ref 34.0–46.6)
Hemoglobin: 14 g/dL (ref 11.1–15.9)
Immature Grans (Abs): 0 10*3/uL (ref 0.0–0.1)
Immature Granulocytes: 0 %
Lymphocytes Absolute: 1.4 10*3/uL (ref 0.7–3.1)
Lymphs: 24 %
MCH: 27.8 pg (ref 26.6–33.0)
MCHC: 32.8 g/dL (ref 31.5–35.7)
MCV: 85 fL (ref 79–97)
Monocytes Absolute: 0.4 10*3/uL (ref 0.1–0.9)
Monocytes: 6 %
Neutrophils Absolute: 3.8 10*3/uL (ref 1.4–7.0)
Neutrophils: 67 %
Platelets: 217 10*3/uL (ref 150–450)
RBC: 5.04 x10E6/uL (ref 3.77–5.28)
RDW: 15.1 % (ref 11.7–15.4)
WBC: 5.7 10*3/uL (ref 3.4–10.8)

## 2022-08-16 LAB — LIPID PANEL WITH LDL/HDL RATIO
Cholesterol, Total: 176 mg/dL (ref 100–199)
HDL: 44 mg/dL (ref >39)
LDL Chol Calc (NIH): 108 mg/dL — ABNORMAL HIGH (ref 0–99)
LDL/HDL Ratio: 2.5 ratio (ref 0.0–3.2)
Triglycerides: 135 mg/dL (ref 0–149)
VLDL Cholesterol Cal: 24 mg/dL (ref 5–40)

## 2022-08-16 LAB — CMP14+EGFR
ALT: 28 IU/L (ref 0–32)
AST: 41 IU/L — ABNORMAL HIGH (ref 0–40)
Albumin/Globulin Ratio: 1.8 (ref 1.2–2.2)
Albumin: 4.6 g/dL (ref 3.8–4.9)
Alkaline Phosphatase: 76 IU/L (ref 44–121)
BUN/Creatinine Ratio: 19 (ref 9–23)
BUN: 15 mg/dL (ref 6–24)
Bilirubin Total: 0.5 mg/dL (ref 0.0–1.2)
CO2: 20 mmol/L (ref 20–29)
Calcium: 9.5 mg/dL (ref 8.7–10.2)
Chloride: 103 mmol/L (ref 96–106)
Creatinine, Ser: 0.8 mg/dL (ref 0.57–1.00)
Globulin, Total: 2.5 g/dL (ref 1.5–4.5)
Glucose: 92 mg/dL (ref 70–99)
Potassium: 4.4 mmol/L (ref 3.5–5.2)
Sodium: 138 mmol/L (ref 134–144)
Total Protein: 7.1 g/dL (ref 6.0–8.5)
eGFR: 89 mL/min/{1.73_m2} (ref >59)

## 2022-08-16 LAB — HEMOGLOBIN A1C
Est. average glucose Bld gHb Est-mCnc: 114 mg/dL
Hgb A1c MFr Bld: 5.6 % (ref 4.8–5.6)

## 2022-08-16 LAB — VITAMIN D 25 HYDROXY (VIT D DEFICIENCY, FRACTURES): Vit D, 25-Hydroxy: 59.8 ng/mL (ref 30.0–100.0)

## 2022-08-16 LAB — INSULIN, RANDOM: INSULIN: 16 u[IU]/mL (ref 2.6–24.9)

## 2022-08-16 LAB — TSH: TSH: 3.25 u[IU]/mL (ref 0.450–4.500)

## 2022-08-16 LAB — VITAMIN B12: Vitamin B-12: 925 pg/mL (ref 232–1245)

## 2022-08-20 DIAGNOSIS — M6281 Muscle weakness (generalized): Secondary | ICD-10-CM | POA: Diagnosis not present

## 2022-08-20 DIAGNOSIS — M545 Low back pain, unspecified: Secondary | ICD-10-CM | POA: Diagnosis not present

## 2022-08-20 NOTE — Progress Notes (Signed)
Chief Complaint:   OBESITY LEGACY CARRENDER (MR# [id number]) is a 51 y.o. female who presents for evaluation and treatment of obesity and related comorbidities. Current BMI is Body mass index is 46.43 kg/m. Ameri has been struggling with her weight for many years and has been unsuccessful in either losing weight, maintaining weight loss, or reaching her healthy weight goal.  Parisa is currently in the action stage of change and ready to dedicate time achieving and maintaining a healthier weight. Betsie is interested in becoming our patient and working on intensive lifestyle modifications including (but not limited to) diet and exercise for weight loss.  Yamil's habits were reviewed today and are as follows: Her family eats meals together, she thinks her family will eat healthier with her, her desired weight loss is 119-129 lbs, she started gaining weight after her last pregnancy and after taking Abilify, her heaviest weight ever was 307.8 pounds, she is a picky eater and doesn't like to eat healthier foods, she has significant food cravings issues, she snacks frequently in the evenings, she is frequently drinking liquids with calories, she frequently makes poor food choices, and she struggles with emotional eating.  Depression Screen Kanasia's Food and Mood (modified PHQ-9) score was 25.     08/14/2022    9:27 AM  Depression screen PHQ 2/9  Decreased Interest 3  Down, Depressed, Hopeless 3  PHQ - 2 Score 6  Altered sleeping 3  Tired, decreased energy 3  Change in appetite 3  Feeling bad or failure about yourself  3  Trouble concentrating 3  Moving slowly or fidgety/restless 2  Suicidal thoughts 2  PHQ-9 Score 25  Difficult doing work/chores Extremely dIfficult   Subjective:   1. Other fatigue Ladaja admits to daytime somnolence and admits to waking up still tired. Patient has a history of symptoms of daytime fatigue, morning fatigue, and morning headache. Sharran generally gets 7 hours  of sleep per night, and states that she has generally restful sleep. Snoring is present. Apneic episodes are not present. Epworth Sleepiness Score is 17.   2. SOBOE (shortness of breath on exertion) Kenitra notes increasing shortness of breath with exercising and seems to be worsening over time with weight gain. She notes getting out of breath sooner with activity than she used to. This has not gotten worse recently. Charlize denies shortness of breath at rest or orthopnea.  3. Prediabetes Siedah is ready to work on her diet, and she is due for labs.  She struggles with simple carbohydrates.  4. Vitamin D deficiency Yarima has a history of vitamin D deficiency, and she is due for labs.  5. B12 deficiency Terre has a history of B12 deficiency, and she is due for labs.  She has no history of anemia or gastric surgery.  She does not eat much meat in general.  Assessment/Plan:   1. Other fatigue Cyrene does feel that her weight is causing her energy to be lower than it should be. Fatigue may be related to obesity, depression or many other causes. Labs will be ordered, and in the meanwhile, Camber will focus on self care including making healthy food choices, increasing physical activity and focusing on stress reduction.  - TSH - CBC with Differential/Platelet  2. SOBOE (shortness of breath on exertion) Adrea does feel that she gets out of breath more easily that she used to when she exercises. Anabia's shortness of breath appears to be obesity related and exercise induced. She has  agreed to work on weight loss and gradually increase exercise to treat her exercise induced shortness of breath. Will continue to monitor closely.  3. Prediabetes We will check labs today. Ladrea will work on her diet, exercise, and decreasing simple carbohydrates to help decrease the risk of diabetes.   - CMP14+EGFR - Hemoglobin A1c - Insulin, random - Lipid Panel With LDL/HDL Ratio  4. Vitamin D deficiency We will  check labs today. Kalisha will follow-up for routine testing of Vitamin D, at least 2-3 times per year to avoid over-replacement.  - VITAMIN D 25 Hydroxy (Vit-D Deficiency, Fractures)  5. B12 deficiency The diagnosis was reviewed with the patient. We will check labs today, and we will follow-up at Bruce's next visit. Orders and follow up as documented in patient record.  - Vitamin B12  6. Depression screening Niketa had a positive depression screening. Depression is commonly associated with obesity and often results in emotional eating behaviors. We will monitor this closely and work on CBT to help improve the non-hunger eating patterns. Referral to Psychology may be required if no improvement is seen as she continues in our clinic.  7. Class 3 severe obesity with serious comorbidity and body mass index (BMI) of 45.0 to 49.9 in adult, unspecified obesity type [facility]) Talonda is currently in the action stage of change and her goal is to continue with weight loss efforts. I recommend Blayre begin the structured treatment plan as follows:  She has agreed to the Category 2 Plan.  Exercise goals: No exercise has been prescribed for now, while we concentrate on nutritional changes.   Behavioral modification strategies: increasing lean protein intake.  She was informed of the importance of frequent follow-up visits to maximize her success with intensive lifestyle modifications for her multiple health conditions. She was informed we would discuss her lab results at her next visit unless there is a critical issue that needs to be addressed sooner. Gerri agreed to keep her next visit at the agreed upon time to discuss these results.  Objective:   Blood pressure 118/84, pulse 78, temperature 98.7 F (37.1 C), height _0  (1.651 m), weight 279 lb (126.6 kg), last menstrual period 05/15/2021, SpO2 96 %. Body mass index is 46.43 kg/m.  EKG: Normal sinus rhythm, rate 81 BPM.  Indirect Calorimeter completed  today shows a VO2 of 303 and a REE of 2088.  Her calculated basal metabolic rate is 2706 thus her basal metabolic rate is better than expected.  General: Cooperative, alert, well developed, in no acute distress. HEENT: Conjunctivae and lids unremarkable. Cardiovascular: Regular rhythm.  Lungs: Normal work of breathing. Neurologic: No focal deficits.   Lab Results  Component Value Date   CREATININE 0.80 08/14/2022   BUN 15 08/14/2022   NA 138 08/14/2022   K 4.4 08/14/2022   CL 103 08/14/2022   CO2 20 08/14/2022   Lab Results  Component Value Date   ALT 28 08/14/2022   AST 41 (H) 08/14/2022   ALKPHOS 76 08/14/2022   BILITOT 0.5 08/14/2022   Lab Results  Component Value Date   HGBA1C 5.6 08/14/2022   Lab Results  Component Value Date   INSULIN 16.0 08/14/2022   Lab Results  Component Value Date   TSH 3.250 08/14/2022   Lab Results  Component Value Date   CHOL 176 08/14/2022   HDL 44 08/14/2022   LDLCALC 108 (H) 08/14/2022   TRIG 135 08/14/2022   Lab Results  Component Value Date   WBC  5.7 08/14/2022   HGB 14.0 08/14/2022   HCT 42.7 08/14/2022   MCV 85 08/14/2022   PLT 217 08/14/2022   Lab Results  Component Value Date   IRON 63 04/26/2021   TIBC 418 04/26/2021   FERRITIN 116.4 04/26/2021   Attestation Statements:   Reviewed by clinician on day of visit: allergies, medications, problem list, medical history, surgical history, family history, social history, and previous encounter notes.  Time spent on visit including pre-visit chart review and post-visit charting and care was 40 minutes.   I, Trixie Dredge, am acting as transcriptionist for Dennard Nip, MD.  I have reviewed the above documentation for accuracy and completeness, and I agree with the above. - Dennard Nip, MD

## 2022-08-22 DIAGNOSIS — M545 Low back pain, unspecified: Secondary | ICD-10-CM | POA: Diagnosis not present

## 2022-08-22 DIAGNOSIS — M6281 Muscle weakness (generalized): Secondary | ICD-10-CM | POA: Diagnosis not present

## 2022-08-23 ENCOUNTER — Ambulatory Visit (INDEPENDENT_AMBULATORY_CARE_PROVIDER_SITE_OTHER): Payer: BC Managed Care – PPO | Admitting: Psychology

## 2022-08-23 DIAGNOSIS — F419 Anxiety disorder, unspecified: Secondary | ICD-10-CM | POA: Diagnosis not present

## 2022-08-23 DIAGNOSIS — F331 Major depressive disorder, recurrent, moderate: Secondary | ICD-10-CM | POA: Diagnosis not present

## 2022-08-23 DIAGNOSIS — F5081 Binge eating disorder: Secondary | ICD-10-CM | POA: Diagnosis not present

## 2022-08-23 DIAGNOSIS — F50819 Binge eating disorder, unspecified: Secondary | ICD-10-CM

## 2022-08-23 NOTE — Progress Notes (Signed)
[place] Counselor/Therapist Progress Note  Patient ID: Jacqueline Fowler, MRN: [id number],    Date: 08/23/2022  Time Spent: 60 minutes  Treatment Type: Individual Therapy  Reported Symptoms: depression  Mental Status Exam: Appearance:  Casual     Behavior: Appropriate  Motor: Normal  Speech/Language:  Normal Rate  Affect: Blunt  Mood: pleasant  Thought process: normal  Thought content:   WNL  Sensory/Perceptual disturbances:   WNL  Orientation: oriented to person, place, time/date, and situation  Attention: Good  Concentration: Good  Memory: WNL  Fund of knowledge:  Good  Insight:   Good  Judgment:  Good  Impulse Control: Good   Risk Assessment: Danger to Self:  No Self-injurious Behavior: No Danger to Others: No Duty to Warn:no Physical Aggression / Violence:No  Access to Firearms a concern: No  Gang Involvement:No   Subjective: The patient attended a face-to-face individual therapy session via video visit today.  The patient gave verbal consent for the session to be on video on WebEx.  The patient was in her home alone and the therapist was in the office.  The patient presents with a blunted affect and mood is pleasant.  The patient reports that she has been doing okay and has not really been feeling extremely depressed.  She does state that she is having some issues with being sleepy all the time.  We went over her medication list and it seems that she is on several medications that cause her to be sleepy.  I recommended that she speak with her medication provider to see if there is anything that can be done about this.  We also talked about her getting into a better routine of sleeping and also of being awake and we talked about different strategies to help her with this.  I explained to her that her body operates on circadian rhythms and then if she is just sleeping all the time whenever she wants to sleep she is going to get used to doing that.  We  talked about how to implement these strategies and the patient is going to try that between now and the next time I see her. Interventions: Cognitive Behavioral Therapy  Diagnosis:Major depressive disorder, recurrent episode, moderate ([facility])  Anxiety disorder, unspecified type  Binge eating disorder  Plan: Client Abilities/Strengths  Intelligent, motivated, insightful  Client Treatment Preferences  Outpatient Individual therapy  Client Statement of Needs  "I need some help with my depression"  Treatment Level  Outpatient Individual therapy  Symptoms  Depressed or irritable mood.:  (Status: Improved). Feelings of hopelessness,  worthlessness, or inappropriate guilt.: (Status: maintained). History of chronic  or recurrent depression for which the client has taken antidepressant medication, been hospitalized, had outpatient treatment, or had a course of electroconvulsive therapy.: (Status:  maintained). Lack of energy.:  (Status: maintained). Low self-esteem.:  (Status: maintained). Poor concentration and indecisiveness.: (Status: maintained). Sleeplessness or hypersomnia.: (Status:  maintained). Social withdrawal.: (Status: maintained).  Problems Addressed  Unipolar Depression, Unipolar Depression    Goals 1. Develop healthy interpersonal relationships that lead to the alleviation  and help prevent the relapse of depression. 2. Develop healthy thinking patterns and beliefs about self, others, and the world that lead to the alleviation and help prevent the relapse of  depression. Objective Verbalize an understanding of healthy and unhealthy emotions with the intent of increasing the use of  healthy emotions to guide actions. Target Date: 2023-05-02 Frequency: Weekly Progress: 40Modality: individual Objective Learn  and implement behavioral strategies to overcome depression. Target Date: 2023-05-02 Frequency: Weekly Progress: 40 Modality: individual Related Interventions 1.  Assist the client in developing skills that increase the likelihood of deriving pleasure from  behavioral activation (e.g., assertiveness skills, developing an exercise plan, less internal/more  external focus, increased social involvement); reinforce success. Objective Describe current and past experiences with depression including their impact on functioning and  attempts to resolve it. Target Date: 2023-05-02 Frequency: Weekly Progress: 40 Modality: individual Related Interventions 1. Encourage the client to share his/her thoughts and feelings of depression; express empathy and  build rapport while identifying primary cognitive, behavioral, interpersonal, or other  contributors to depression.  Objective Learn and implement problem-solving and decision-making skills. Target Date: 2023-05-02 Frequency: Weekly Progress: 40 Modality: individual Related Interventions 1. Encourage in the client the development of a positive problem orientation in which problems  and solving them are viewed as a natural part of life and not something to be feared, despaired,  or avoided. Objective Identify and replace thoughts and beliefs that support depression. Target Date: 2023-05-02 Frequency: Weekly Progress: 10 Modality: individual Related Interventions 1. Conduct Cognitive-Behavioral Therapy (see Cognitive Behavior Therapy by Olevia Bowens; Overcoming Depression by Lynita Lombard al.), beginning with helping the client learn the connection among  cognition, depressive feelings, and actions. 2. Facilitate and reinforce the client's shift from biased depressive self-talk and beliefs to realitybased cognitive messages that enhance self-confidence and increase adaptive actions (see  "Positive Self-Talk" in the Adult Psychotherapy Homework Planner by Bryn Gulling). Diagnosis Axis  none 296.32 (Major depressive affective disorder, recurrent episode, moderate) - Open -  [Signifier: n/a]  Conditions For  Discharge Achievement of treatment goals and objectives Will continue to see the patient at least biweekly and work with her using CBT, Insight oriented approach and EMDR.  Patient approved the treatment plan.  Jenee Spaugh G Amaiyah Nordhoff, LCSW

## 2022-08-26 ENCOUNTER — Telehealth: Payer: Self-pay | Admitting: Gastroenterology

## 2022-08-26 NOTE — Telephone Encounter (Signed)
Pt questioned scheduling of MRI: Chart reviewed. Noted reminder placed in Epic to call pt on Dec 7th. Pt made aware: Pt verbalized understanding with all questions answered.

## 2022-08-26 NOTE — Telephone Encounter (Signed)
Inbound call from patient stating that she needs to have an MRI, but was not sure if she needs to schedule it herself or if our office needs to do it. Patient is requesting a call back to discuss. Please advise.

## 2022-08-27 DIAGNOSIS — M545 Low back pain, unspecified: Secondary | ICD-10-CM | POA: Diagnosis not present

## 2022-08-27 DIAGNOSIS — M6281 Muscle weakness (generalized): Secondary | ICD-10-CM | POA: Diagnosis not present

## 2022-08-28 ENCOUNTER — Encounter (INDEPENDENT_AMBULATORY_CARE_PROVIDER_SITE_OTHER): Payer: Self-pay | Admitting: Family Medicine

## 2022-08-28 ENCOUNTER — Ambulatory Visit (INDEPENDENT_AMBULATORY_CARE_PROVIDER_SITE_OTHER): Payer: BC Managed Care – PPO | Admitting: Family Medicine

## 2022-08-28 VITALS — BP 98/68 | HR 76 | Temp 98.1°F | Ht 65.0 in | Wt 275.0 lb

## 2022-08-28 DIAGNOSIS — E7849 Other hyperlipidemia: Secondary | ICD-10-CM | POA: Diagnosis not present

## 2022-08-28 DIAGNOSIS — Z6841 Body Mass Index (BMI) 40.0 and over, adult: Secondary | ICD-10-CM

## 2022-08-28 DIAGNOSIS — R7303 Prediabetes: Secondary | ICD-10-CM | POA: Diagnosis not present

## 2022-08-28 DIAGNOSIS — R7401 Elevation of levels of liver transaminase levels: Secondary | ICD-10-CM | POA: Diagnosis not present

## 2022-08-28 DIAGNOSIS — E669 Obesity, unspecified: Secondary | ICD-10-CM

## 2022-08-28 MED ORDER — METFORMIN HCL 500 MG PO TABS: 500.0000 mg | ORAL_TABLET | Freq: Every day | ORAL | 0 refills | Status: DC

## 2022-08-28 NOTE — Progress Notes (Signed)
Office: [phone]  /  Fax: [phone]    Total lbs lost to date: 4 Total lbs lost since last in-office visit: 4     BP 98/68   Pulse 76   Temp 98.1 F (36.7 C)   Ht '5\' 5"'$  (1.651 m)   Wt 275 lb (124.7 kg)   LMP 05/15/2021 (Approximate)   SpO2 96%   BMI 45.76 kg/m  She was weighed on the bioimpedance scale:  Body mass index is 45.76 kg/m.  General:  Alert, oriented and cooperative. Patient is in no acute distress.  Mental Status: Normal mood and affect. Normal behavior. Normal judgment and thought content.        Patient past medical history includes:   Past Medical History:  Diagnosis Date   Anxiety    B12 deficiency    Back pain    Chest pain    Cirrhosis of liver ([place])    Depression    Fatty liver    Gallbladder problem    Gallstones 2004   GERD (gastroesophageal reflux disease)    History of stomach ulcers    IBS (irritable bowel syndrome)    Joint pain    Obesity    Pre-diabetes    Swallowing difficulty    UTI (urinary tract infection)    Vitamin D deficiency    History of Present Illness The patient presents with a history of obesity and has recently lost four pounds, although they believe they could have lost more. They have been following a new eating plan called "Eat Smart Move More" and have experienced some success with it. The patient has been monitoring their blood pressure, which has shown some fluctuations, but they were informed that they might be using the blood pressure cuff incorrectly. They have a history of cirrhosis, which may be contributing to slightly elevated liver function tests. The patient is currently taking a multivitamin, Centrum Women Plus 50, and has normal vitamin D and B12 levels.  The patient's blood test results show that their electrolytes, kidney function, and red and white cell breakdown are all within normal ranges. However, their HDL cholesterol is slightly below the desired level of 50, currently at 44. Their  triglycerides are within the normal range at 135, and their LDL cholesterol is slightly above the normal range at 100. The patient's fasting glucose is normal at 92, but their hemoglobin A1c is 5.6, which indicates prediabetes. They have been advised to focus on improving their diet and exercise habits to manage this condition.  The patient has been experiencing cravings and hunger, which they attribute to their elevated insulin levels. They have been advised to focus on consuming more protein and complex carbohydrates to help manage their insulin resistance and promote weight loss. The patient is considering the use of metformin to help improve their insulin sensitivity and support their weight loss efforts.  In summary, the patient is currently managing obesity and prediabetes through dietary changes and is considering the addition of metformin to their treatment plan. They have experienced some weight loss success but acknowledge that there is room for improvement in their diet and exercise habits. The patient's blood test results show some areas of concern, such as slightly elevated liver function tests and cholesterol levels, which will need to be monitored and addressed through lifestyle changes and potential medication adjustments.  Assessment & Plan Weight Management: Patient has lost 4 pounds, 3 of which were fat. However, patient admits to struggling with dietary changes and has been indulging  in creamer. -Encourage continued adherence to dietary plan, focusing on calorie intake between 1300-1600 calories per day. -Consider adding Metformin to help with insulin resistance and weight loss.  Insulin Resistance: Patient has a Hemoglobin A1c of 5.6, indicating prediabetes. -Continue to monitor blood glucose levels and encourage dietary changes to prevent progression to diabetes.  Cirrhosis: Mildly elevated AST likely due to known cirrhosis. -Continue to monitor liver function  tests.  HLD: -continue diet and weight loss and will recheck labs in 3 months  Follow-up in 1 month.  Dennard Nip, MD

## 2022-09-05 DIAGNOSIS — M545 Low back pain, unspecified: Secondary | ICD-10-CM | POA: Diagnosis not present

## 2022-09-05 DIAGNOSIS — M6281 Muscle weakness (generalized): Secondary | ICD-10-CM | POA: Diagnosis not present

## 2022-09-06 ENCOUNTER — Ambulatory Visit (INDEPENDENT_AMBULATORY_CARE_PROVIDER_SITE_OTHER): Payer: BC Managed Care – PPO | Admitting: Psychology

## 2022-09-06 DIAGNOSIS — F331 Major depressive disorder, recurrent, moderate: Secondary | ICD-10-CM | POA: Diagnosis not present

## 2022-09-06 DIAGNOSIS — F5081 Binge eating disorder: Secondary | ICD-10-CM

## 2022-09-06 DIAGNOSIS — F419 Anxiety disorder, unspecified: Secondary | ICD-10-CM

## 2022-09-08 NOTE — Progress Notes (Signed)
[facility] Counselor/Therapist Progress Note  Patient ID: Jacqueline Fowler, MRN: [id number],    Date: 09/06/2022  Time Spent: 60 minutes  Treatment Type: Individual Therapy  Reported Symptoms: depression  Mental Status Exam: Appearance:  Casual     Behavior: Appropriate  Motor: Normal  Speech/Language:  Normal Rate  Affect: Blunt  Mood: pleasant  Thought process: normal  Thought content:   WNL  Sensory/Perceptual disturbances:   WNL  Orientation: oriented to person, place, time/date, and situation  Attention: Good  Concentration: Good  Memory: WNL  Fund of knowledge:  Good  Insight:   Good  Judgment:  Good  Impulse Control: Good   Risk Assessment: Danger to Self:  No Self-injurious Behavior: No Danger to Others: No Duty to Warn:no Physical Aggression / Violence:No  Access to Firearms a concern: No  Gang Involvement:No   Subjective: The patient attended a face-to-face individual therapy session via video visit today.  The patient gave verbal consent for the session to be on video on WebEx.  The patient was in her home alone and the therapist was in the office.  The patient presents with a blunted affect and mood is pleasant.  We spoke about the progress that the patient has been making since being in therapy.  The patient states that she is not feeling as depressed as she felt before and does not seem to get as down on herself as much.  We talked about her feeling more secure with herself.  Worked with patient on trying to understand other peoples agenda's and that they are operating from their own perspective and it does not necessarily mean that it is the right perspective.  The patient seems to have unrealistic expectations of others and takes their perception as being the "right" perspective.  We talked about her trusting her own judgement and being aware that her perspective is sometimes right on target and the other person may be the one with the issue.     Interventions: Cognitive Behavioral Therapy  Diagnosis:Major depressive disorder, recurrent episode, moderate (HCC)  Anxiety disorder, unspecified type  Binge eating disorder  Plan: Client Abilities/Strengths  Intelligent, motivated, insightful  Client Treatment Preferences  Outpatient Individual therapy  Client Statement of Needs  "I need some help with my depression"  Treatment Level  Outpatient Individual therapy  Symptoms  Depressed or irritable mood.:  (Status: Improved). Feelings of hopelessness,  worthlessness, or inappropriate guilt.: (Status: maintained). History of chronic  or recurrent depression for which the client has taken antidepressant medication, been hospitalized, had outpatient treatment, or had a course of electroconvulsive therapy.: (Status:  maintained). Lack of energy.:  (Status: maintained). Low self-esteem.:  (Status: maintained). Poor concentration and indecisiveness.: (Status: maintained). Sleeplessness or hypersomnia.: (Status:  maintained). Social withdrawal.: (Status: maintained).  Problems Addressed  Unipolar Depression, Unipolar Depression    Goals 1. Develop healthy interpersonal relationships that lead to the alleviation  and help prevent the relapse of depression. 2. Develop healthy thinking patterns and beliefs about self, others, and the world that lead to the alleviation and help prevent the relapse of  depression. Objective Verbalize an understanding of healthy and unhealthy emotions with the intent of increasing the use of  healthy emotions to guide actions. Target Date: 2023-05-02 Frequency: Weekly Progress: 40Modality: individual Objective Learn and implement behavioral strategies to overcome depression. Target Date: 2023-05-02 Frequency: Weekly Progress: 40 Modality: individual Related Interventions 1. Assist the client in developing skills that increase the likelihood of deriving pleasure from  behavioral activation (e.g.,  assertiveness skills, developing an exercise plan, less internal/more  external focus, increased social involvement); reinforce success. Objective Describe current and past experiences with depression including their impact on functioning and  attempts to resolve it. Target Date: 2023-05-02 Frequency: Weekly Progress: 40 Modality: individual Related Interventions 1. Encourage the client to share his/her thoughts and feelings of depression; express empathy and  build rapport while identifying primary cognitive, behavioral, interpersonal, or other  contributors to depression.  Objective Learn and implement problem-solving and decision-making skills. Target Date: 2023-05-02 Frequency: Weekly Progress: 40 Modality: individual Related Interventions 1. Encourage in the client the development of a positive problem orientation in which problems  and solving them are viewed as a natural part of life and not something to be feared, despaired,  or avoided. Objective Identify and replace thoughts and beliefs that support depression. Target Date: 2023-05-02 Frequency: Weekly Progress: 10 Modality: individual Related Interventions 1. Conduct Cognitive-Behavioral Therapy (see Cognitive Behavior Therapy by Olevia Bowens; Overcoming Depression by Lynita Lombard al.), beginning with helping the client learn the connection among  cognition, depressive feelings, and actions. 2. Facilitate and reinforce the client's shift from biased depressive self-talk and beliefs to realitybased cognitive messages that enhance self-confidence and increase adaptive actions (see  "Positive Self-Talk" in the Adult Psychotherapy Homework Planner by Bryn Gulling). Diagnosis Axis  none 296.32 (Major depressive affective disorder, recurrent episode, moderate) - Open -  [Signifier: n/a]  Conditions For Discharge Achievement of treatment goals and objectives Will continue to see the patient at least biweekly and work with her using CBT,  Insight oriented approach and EMDR.  Patient approved the treatment plan.  Verle Brillhart G Khady Vandenberg, LCSW

## 2022-09-10 DIAGNOSIS — M6281 Muscle weakness (generalized): Secondary | ICD-10-CM | POA: Diagnosis not present

## 2022-09-10 DIAGNOSIS — M545 Low back pain, unspecified: Secondary | ICD-10-CM | POA: Diagnosis not present

## 2022-09-11 ENCOUNTER — Ambulatory Visit (INDEPENDENT_AMBULATORY_CARE_PROVIDER_SITE_OTHER): Payer: BC Managed Care – PPO | Admitting: Family Medicine

## 2022-09-11 ENCOUNTER — Encounter (INDEPENDENT_AMBULATORY_CARE_PROVIDER_SITE_OTHER): Payer: Self-pay | Admitting: Family Medicine

## 2022-09-11 VITALS — BP 107/72 | HR 76 | Temp 98.4°F | Ht 65.0 in | Wt 273.0 lb

## 2022-09-11 DIAGNOSIS — R7989 Other specified abnormal findings of blood chemistry: Secondary | ICD-10-CM | POA: Diagnosis not present

## 2022-09-11 DIAGNOSIS — R7303 Prediabetes: Secondary | ICD-10-CM | POA: Diagnosis not present

## 2022-09-11 DIAGNOSIS — E669 Obesity, unspecified: Secondary | ICD-10-CM

## 2022-09-11 DIAGNOSIS — Z6841 Body Mass Index (BMI) 40.0 and over, adult: Secondary | ICD-10-CM

## 2022-09-11 MED ORDER — METFORMIN HCL 500 MG PO TABS: 500.0000 mg | ORAL_TABLET | Freq: Two times a day (BID) | ORAL | 0 refills | Status: DC

## 2022-09-13 DIAGNOSIS — M545 Low back pain, unspecified: Secondary | ICD-10-CM | POA: Diagnosis not present

## 2022-09-13 DIAGNOSIS — M6281 Muscle weakness (generalized): Secondary | ICD-10-CM | POA: Diagnosis not present

## 2022-09-14 ENCOUNTER — Other Ambulatory Visit: Payer: Self-pay | Admitting: Gastroenterology

## 2022-09-14 DIAGNOSIS — K219 Gastro-esophageal reflux disease without esophagitis: Secondary | ICD-10-CM

## 2022-09-17 ENCOUNTER — Telehealth: Payer: Self-pay

## 2022-09-17 DIAGNOSIS — M545 Low back pain, unspecified: Secondary | ICD-10-CM | POA: Diagnosis not present

## 2022-09-17 DIAGNOSIS — M6281 Muscle weakness (generalized): Secondary | ICD-10-CM | POA: Diagnosis not present

## 2022-09-17 NOTE — Telephone Encounter (Signed)
Prior Authorization submitted for REXULTI 2 MG with [employer] of , approval received effective 09/17/2022-09/16/2023

## 2022-09-18 ENCOUNTER — Other Ambulatory Visit: Payer: Self-pay

## 2022-09-18 ENCOUNTER — Encounter: Payer: Self-pay | Admitting: Gastroenterology

## 2022-09-18 DIAGNOSIS — K449 Diaphragmatic hernia without obstruction or gangrene: Secondary | ICD-10-CM

## 2022-09-18 MED ORDER — PANTOPRAZOLE SODIUM 40 MG PO TBEC: 40.0000 mg | DELAYED_RELEASE_TABLET | Freq: Two times a day (BID) | ORAL | 1 refills | Status: DC

## 2022-09-18 NOTE — Progress Notes (Signed)
Refill of pantoprazole sent to [facility]

## 2022-09-20 ENCOUNTER — Ambulatory Visit (INDEPENDENT_AMBULATORY_CARE_PROVIDER_SITE_OTHER): Payer: BC Managed Care – PPO | Admitting: Psychology

## 2022-09-20 DIAGNOSIS — F419 Anxiety disorder, unspecified: Secondary | ICD-10-CM | POA: Diagnosis not present

## 2022-09-20 DIAGNOSIS — F5081 Binge eating disorder: Secondary | ICD-10-CM | POA: Diagnosis not present

## 2022-09-20 DIAGNOSIS — F331 Major depressive disorder, recurrent, moderate: Secondary | ICD-10-CM

## 2022-09-20 NOTE — Progress Notes (Signed)
[facility] Counselor/Therapist Progress Note  Patient ID: Jacqueline Fowler, MRN: [id number],    Date: 09/20/2022  Time Spent: 60 minutes  Treatment Type: Individual Therapy  Reported Symptoms: depression  Mental Status Exam: Appearance:  Casual     Behavior: Appropriate  Motor: Normal  Speech/Language:  Normal Rate  Affect: Blunt  Mood: Depressed  Thought process: normal  Thought content:   WNL  Sensory/Perceptual disturbances:   WNL  Orientation: oriented to person, place, time/date, and situation  Attention: Good  Concentration: Good  Memory: WNL  Fund of knowledge:  Good  Insight:   Good  Judgment:  Good  Impulse Control: Good   Risk Assessment: Danger to Self:  No Self-injurious Behavior: No Danger to Others: No Duty to Warn:no Physical Aggression / Violence:No  Access to Firearms a concern: No  Gang Involvement:No   Subjective: The patient attended a face-to-face individual therapy session via video visit today.  The patient gave verbal consent for the session to be on video on WebEx.  The patient was in her home alone and the therapist was in the office.  The patient presents with a blunted affect and her mood is depressed.  The patient states that she has been feeling really bad about herself and went into talking about how terrible and awful she is and that she likes nothing about how she looks or who she is.  We did a lot of reframing and talking about changing her thought process and reviewed what we had talked about previously with her in regard to her ability to change her own thoughts and feel differently.  We still need to do EMDR moving forward and we talked briefly about doing that and whether she might want to come to the office to do that.  The patient reported feeling better by the end of the session and I encouraged her to get back into mindfulness and meditation because she is not doing any of those kinds of self care activities  now.  Interventions: Cognitive Behavioral Therapy  Diagnosis:Major depressive disorder, recurrent episode, moderate (HCC)  Anxiety disorder, unspecified type  Binge eating disorder  Plan: Client Abilities/Strengths  Intelligent, motivated, insightful  Client Treatment Preferences  Outpatient Individual therapy  Client Statement of Needs  "I need some help with my depression"  Treatment Level  Outpatient Individual therapy  Symptoms  Depressed or irritable mood.:  (Status: Improved). Feelings of hopelessness,  worthlessness, or inappropriate guilt.: (Status: maintained). History of chronic  or recurrent depression for which the client has taken antidepressant medication, been hospitalized, had outpatient treatment, or had a course of electroconvulsive therapy.: (Status:  maintained). Lack of energy.:  (Status: maintained). Low self-esteem.:  (Status: maintained). Poor concentration and indecisiveness.: (Status: maintained). Sleeplessness or hypersomnia.: (Status:  maintained). Social withdrawal.: (Status: maintained).  Problems Addressed  Unipolar Depression, Unipolar Depression    Goals 1. Develop healthy interpersonal relationships that lead to the alleviation  and help prevent the relapse of depression. 2. Develop healthy thinking patterns and beliefs about self, others, and the world that lead to the alleviation and help prevent the relapse of  depression. Objective Verbalize an understanding of healthy and unhealthy emotions with the intent of increasing the use of  healthy emotions to guide actions. Target Date: 2023-05-02 Frequency: Weekly Progress: 40Modality: individual Objective Learn and implement behavioral strategies to overcome depression. Target Date: 2023-05-02 Frequency: Weekly Progress: 40 Modality: individual Related Interventions 1. Assist the client in developing skills that increase the likelihood  of deriving pleasure from  behavioral activation  (e.g., assertiveness skills, developing an exercise plan, less internal/more  external focus, increased social involvement); reinforce success. Objective Describe current and past experiences with depression including their impact on functioning and  attempts to resolve it. Target Date: 2023-05-02 Frequency: Weekly Progress: 40 Modality: individual Related Interventions 1. Encourage the client to share his/her thoughts and feelings of depression; express empathy and  build rapport while identifying primary cognitive, behavioral, interpersonal, or other  contributors to depression.  Objective Learn and implement problem-solving and decision-making skills. Target Date: 2023-05-02 Frequency: Weekly Progress: 40 Modality: individual Related Interventions 1. Encourage in the client the development of a positive problem orientation in which problems  and solving them are viewed as a natural part of life and not something to be feared, despaired,  or avoided. Objective Identify and replace thoughts and beliefs that support depression. Target Date: 2023-05-02 Frequency: Weekly Progress: 10 Modality: individual Related Interventions 1. Conduct Cognitive-Behavioral Therapy (see Cognitive Behavior Therapy by Olevia Bowens; Overcoming Depression by Lynita Lombard al.), beginning with helping the client learn the connection among  cognition, depressive feelings, and actions. 2. Facilitate and reinforce the client's shift from biased depressive self-talk and beliefs to realitybased cognitive messages that enhance self-confidence and increase adaptive actions (see  "Positive Self-Talk" in the Adult Psychotherapy Homework Planner by Bryn Gulling). Diagnosis Axis  none 296.32 (Major depressive affective disorder, recurrent episode, moderate) - Open -  [Signifier: n/a]  Conditions For Discharge Achievement of treatment goals and objectives Will continue to see the patient at least biweekly and work with her using  CBT, Insight oriented approach and EMDR.  Patient approved the treatment plan.  Danira Nylander G Martyn Timme, LCSW

## 2022-09-23 NOTE — Progress Notes (Unsigned)
Chief Complaint:   OBESITY Jacqueline Fowler is here to discuss her progress with her obesity treatment plan along with follow-up of her obesity related diagnoses. Jacqueline Fowler is on keeping a food journal and adhering to recommended goals of 1300-1600 calories and 85+ grams of protein daily and states she is following her eating plan approximately 50% of the time. Ily states she is doing 0 minutes 0 times per week.  Today's visit was #: 3 Starting weight: 279 lbs Starting date: 08/14/2022 Today's weight: 273 lbs Today's date: 09/11/2022 Total lbs lost to date: 6 Total lbs lost since last in-office visit: 2  Interim History: Jacqueline Fowler did well with her weight loss.  She is working on [employer], [employer] and meeting her goals, but she struggled to journal regularly.  She continues to work on getting back into the journaling habit.  Subjective:   1. Prediabetes Jacqueline Fowler started metformin, and she still notes polyphagia especially in the p.m.  2. Elevated LFTs Jacqueline Fowler's recent AST was mildly elevated.  She is working on her weight loss and improving as this is likely due to nonalcoholic fatty liver disease.  She also has a history of Cirrhosis.   Assessment/Plan:   1. Prediabetes Jacqueline Fowler agreed to increase metformin to 500 mg twice daily, and we will refill for 1 month.  - metFORMIN (GLUCOPHAGE) 500 MG tablet; Take 1 tablet (500 mg total) by mouth 2 (two) times daily with a meal.  Dispense: 60 tablet; Refill: 0  2. Elevated LFTs Jacqueline Fowler will continue with her diet and weight loss, and we will recheck labs in 3 months.  3. Obesity, Current BMI 45.5 Jacqueline Fowler is currently in the action stage of change. As such, her goal is to continue with weight loss efforts. She has agreed to keeping a food journal and adhering to recommended goals of 1300-1600 calories and 85+ grams of protein daily.   Behavioral modification strategies: increasing lean protein intake.  Jacqueline Fowler has agreed to follow-up with our clinic in 2 weeks. She  was informed of the importance of frequent follow-up visits to maximize her success with intensive lifestyle modifications for her multiple health conditions.   Objective:   Blood pressure 107/72, pulse 76, temperature 98.4 F (36.9 C), height '5\' 5"'$  (1.651 m), weight 273 lb (123.8 kg), last menstrual period 05/15/2021, SpO2 96 %. Body mass index is 45.43 kg/m.  General: Cooperative, alert, well developed, in no acute distress. HEENT: Conjunctivae and lids unremarkable. Cardiovascular: Regular rhythm.  Lungs: Normal work of breathing. Neurologic: No focal deficits.   Lab Results  Component Value Date   CREATININE 0.80 08/14/2022   BUN 15 08/14/2022   NA 138 08/14/2022   K 4.4 08/14/2022   CL 103 08/14/2022   CO2 20 08/14/2022   Lab Results  Component Value Date   ALT 28 08/14/2022   AST 41 (H) 08/14/2022   ALKPHOS 76 08/14/2022   BILITOT 0.5 08/14/2022   Lab Results  Component Value Date   HGBA1C 5.6 08/14/2022   Lab Results  Component Value Date   INSULIN 16.0 08/14/2022   Lab Results  Component Value Date   TSH 3.250 08/14/2022   Lab Results  Component Value Date   CHOL 176 08/14/2022   HDL 44 08/14/2022   LDLCALC 108 (H) 08/14/2022   TRIG 135 08/14/2022   Lab Results  Component Value Date   VD25OH 59.8 08/14/2022   Lab Results  Component Value Date   WBC 5.7 08/14/2022   HGB 14.0 08/14/2022  HCT 42.7 08/14/2022   MCV 85 08/14/2022   PLT 217 08/14/2022   Lab Results  Component Value Date   IRON 63 04/26/2021   TIBC 418 04/26/2021   FERRITIN 116.4 04/26/2021   Attestation Statements:   Reviewed by clinician on day of visit: allergies, medications, problem list, medical history, surgical history, family history, social history, and previous encounter notes.   I, Trixie Dredge, am acting as transcriptionist for Dennard Nip, MD.  I have reviewed the above documentation for accuracy and completeness, and I agree with the above. -  Dennard Nip, MD

## 2022-09-24 DIAGNOSIS — M545 Low back pain, unspecified: Secondary | ICD-10-CM | POA: Diagnosis not present

## 2022-09-24 DIAGNOSIS — M6281 Muscle weakness (generalized): Secondary | ICD-10-CM | POA: Diagnosis not present

## 2022-09-25 ENCOUNTER — Ambulatory Visit (INDEPENDENT_AMBULATORY_CARE_PROVIDER_SITE_OTHER): Payer: BC Managed Care – PPO | Admitting: Family Medicine

## 2022-09-25 ENCOUNTER — Encounter (INDEPENDENT_AMBULATORY_CARE_PROVIDER_SITE_OTHER): Payer: Self-pay | Admitting: Family Medicine

## 2022-09-25 VITALS — BP 119/75 | HR 71 | Temp 98.0°F | Ht 65.0 in | Wt 272.0 lb

## 2022-09-25 DIAGNOSIS — R7303 Prediabetes: Secondary | ICD-10-CM | POA: Diagnosis not present

## 2022-09-25 DIAGNOSIS — K219 Gastro-esophageal reflux disease without esophagitis: Secondary | ICD-10-CM

## 2022-09-25 DIAGNOSIS — Z6841 Body Mass Index (BMI) 40.0 and over, adult: Secondary | ICD-10-CM

## 2022-09-25 DIAGNOSIS — E669 Obesity, unspecified: Secondary | ICD-10-CM

## 2022-09-25 DIAGNOSIS — F418 Other specified anxiety disorders: Secondary | ICD-10-CM

## 2022-09-27 ENCOUNTER — Ambulatory Visit (INDEPENDENT_AMBULATORY_CARE_PROVIDER_SITE_OTHER): Payer: BC Managed Care – PPO | Admitting: Psychiatry

## 2022-09-27 ENCOUNTER — Encounter: Payer: Self-pay | Admitting: Psychiatry

## 2022-09-27 DIAGNOSIS — F32A Depression, unspecified: Secondary | ICD-10-CM | POA: Diagnosis not present

## 2022-09-27 DIAGNOSIS — F5081 Binge eating disorder: Secondary | ICD-10-CM | POA: Diagnosis not present

## 2022-09-27 DIAGNOSIS — F419 Anxiety disorder, unspecified: Secondary | ICD-10-CM

## 2022-09-27 MED ORDER — LAMOTRIGINE 25 MG PO TABS: ORAL_TABLET | ORAL | 0 refills | Status: DC

## 2022-09-27 MED ORDER — PROPRANOLOL HCL 10 MG PO TABS: ORAL_TABLET | ORAL | 1 refills | Status: DC

## 2022-09-27 MED ORDER — LORAZEPAM 0.5 MG PO TABS: 0.5000 mg | ORAL_TABLET | Freq: Every day | ORAL | 0 refills | Status: DC | PRN

## 2022-09-27 MED ORDER — BUSPIRONE HCL 30 MG PO TABS: 30.0000 mg | ORAL_TABLET | Freq: Two times a day (BID) | ORAL | 1 refills | Status: DC

## 2022-09-27 MED ORDER — BREXPIPRAZOLE 2 MG PO TABS: 2.0000 mg | ORAL_TABLET | Freq: Every day | ORAL | 1 refills | Status: DC

## 2022-09-27 MED ORDER — SERTRALINE HCL 100 MG PO TABS: 100.0000 mg | ORAL_TABLET | Freq: Two times a day (BID) | ORAL | 1 refills | Status: DC

## 2022-09-27 MED ORDER — LAMOTRIGINE 100 MG PO TABS: ORAL_TABLET | ORAL | 2 refills | Status: DC

## 2022-09-27 NOTE — Progress Notes (Signed)
Ling Flesch Kuch [id number] 1971-09-10 51 y.o.  Subjective:   Patient ID:  Jacqueline Fowler is a 51 y.o. (DOB 12-16-1970) female.  Chief Complaint:  Chief Complaint  Patient presents with   Depression   Anxiety    HPI Jacqueline Fowler presents to the office today for follow-up of depression and anxiety.   Back pain has been limiting activities and this causes some depression. She reports that her therapist mentioned considering a mood stabilizer. She reports that she has days "where I can just kind of joke around... but then I have days where I am just super sad." She reports that she will "throw myself deeper into this pit" and dwell on negative thoughts and "I just want it to stop." Denies SI. She reports, "I can go days where I am fine." She reports that other days she has more fatigue, depression, and anxiety. She will play games, sleep, or read so that she does not think about things. She reports sleeping excessively, sometimes 18-19 hours in a 24-hour periods. She reports occasional irritability "but not as bad as it used to be" and reports that she had severe irritability years ago when she was not taking medication. She reports that she has certain triggers for irritability, such as repetitive tapping. She describes some mood reactivity. Appetite has been ok. She reports that she has not been binge eating recently. Denies any change in concentration or focus.   Typically sees therapist every 2 weeks.   Lorazepam last filled 05/07/22.   Past Psychiatric Medication Trials: Valium Sertraline Prozac Wellbutrin-adverse reaction Rexulti Abilify- Weight gain Trazodone Buspar Propranolol Ativan  AIMS    Flowsheet Row Office Visit from 11/28/2021 in [facility] Visit from 07/31/2021 in [facility] Visit from 03/29/2021 in [facility] Total Score 0 0 0      PHQ2-9    [place] Office Visit from 08/14/2022 in [facility]  PHQ-2 Total Score 6  PHQ-9 Total Score 25        Review of Systems:  Review of Systems  Genitourinary:        Having PT for bladder  Musculoskeletal:  Positive for back pain. Negative for gait problem.  Neurological:  Negative for tremors.  Psychiatric/Behavioral:         Please refer to HPI    Medications: I have reviewed the patient's current medications.  Current Outpatient Medications  Medication Sig Dispense Refill   lamoTRIgine (LAMICTAL) 100 MG tablet Take 1 tab po qd x 2 weeks, then increase to 1.5 tabs po qd 45 tablet 2   lamoTRIgine (LAMICTAL) 25 MG tablet Take 1 tablet (25 mg total) by mouth daily for 14 days, THEN 2 tablets (50 mg total) daily for 14 days. 45 tablet 0   acetaminophen (TYLENOL) 500 MG tablet Take 500 mg by mouth as needed.     brexpiprazole (REXULTI) 2 MG TABS tablet Take 1 tablet (2 mg total) by mouth daily. 90 tablet 1   busPIRone (BUSPAR) 30 MG tablet Take 1 tablet (30 mg total) by mouth 2 (two) times daily. 180 tablet 1   Cholecalciferol (VITAMIN D3 PO) Take by mouth.     cyclobenzaprine (FLEXERIL) 5 MG tablet Take 5 mg by mouth 3 (three) times daily.     dicyclomine (BENTYL) 10 MG capsule TAKE 1 CAPSULE BY MOUTH 2 TO 3 TIMES A DAY 270 capsule 1   famotidine (PEPCID) 20 MG tablet TAKE 1  TABLET BY MOUTH EVERYDAY AT BEDTIME 90 tablet 4   hyoscyamine (LEVBID) 0.375 MG 12 hr tablet TAKE 1 TABLET (0.375 MG TOTAL) BY MOUTH 2 (TWO) TIMES DAILY. 180 tablet 2   loperamide (IMODIUM) 2 MG capsule Take by mouth as needed for diarrhea or loose stools.     LORazepam (ATIVAN) 0.5 MG tablet Take 1 tablet (0.5 mg total) by mouth daily as needed for anxiety (panic attack). 15 tablet 0   metFORMIN (GLUCOPHAGE) 500 MG tablet Take 1 tablet (500 mg total) by mouth 2 (two) times daily with a meal. 60 tablet 0   methocarbamol (ROBAXIN) 500 MG tablet Take 500 mg by mouth as needed for muscle spasms.     Multiple Vitamins-Minerals (CENTRUM SILVER  50+WOMEN PO) Take by mouth.     ondansetron (ZOFRAN ODT) 4 MG disintegrating tablet Take 1 tablet (4 mg total) by mouth every 8 (eight) hours as needed for nausea or vomiting. 25 tablet 0   oxyCODONE-acetaminophen (PERCOCET/ROXICET) 5-325 MG tablet Take 1 tablet by mouth every 6 (six) hours as needed.     pantoprazole (PROTONIX) 40 MG tablet Take 1 tablet (40 mg total) by mouth 2 (two) times daily. Take half hour before breakfast and half hour before supper. 180 tablet 1   propranolol (INDERAL) 10 MG tablet TAKE 1 TO 2 TABLETS BY MOUTH TWICE A DAY AS NEEDED FOR ANXIETY 120 tablet 1   Pyridoxine HCl (VITAMIN B-6 PO) Take by mouth.     sertraline (ZOLOFT) 100 MG tablet Take 1 tablet (100 mg total) by mouth in the morning and at bedtime. 180 tablet 1   Current Facility-Administered Medications  Medication Dose Route Frequency Provider Last Rate Last Admin   0.9 %  sodium chloride infusion  500 mL Intravenous Once [name], MD        Medication Side Effects: None  Allergies: No Known Allergies  Past Medical History:  Diagnosis Date   Anxiety    B12 deficiency    Back pain    Chest pain    Cirrhosis of liver ([place])    Depression    Fatty liver    Gallbladder problem    Gallstones 2004   GERD (gastroesophageal reflux disease)    History of stomach ulcers    IBS (irritable bowel syndrome)    Joint pain    Obesity    Pre-diabetes    Swallowing difficulty    UTI (urinary tract infection)    Vitamin D deficiency     Past Medical History, Surgical history, Social history, and Family history were reviewed and updated as appropriate.   Please see review of systems for further details on the patient's review from today.   Objective:   Physical Exam:  LMP 05/15/2021 (Approximate)   Physical Exam Constitutional:      General: She is not in acute distress. Musculoskeletal:        General: No deformity.  Neurological:     Mental Status: She is alert and oriented to person,  place, and time.     Coordination: Coordination normal.  Psychiatric:        Attention and Perception: Attention and perception normal. She does not perceive auditory or visual hallucinations.        Mood and Affect: Mood is anxious and depressed. Affect is not labile, blunt, angry or inappropriate.        Speech: Speech normal.        Behavior: Behavior normal.  Thought Content: Thought content normal. Thought content is not paranoid or delusional. Thought content does not include homicidal or suicidal ideation. Thought content does not include homicidal or suicidal plan.        Cognition and Memory: Cognition and memory normal.        Judgment: Judgment normal.     Comments: Insight intact     Lab Review:     Component Value Date/Time   NA 138 08/14/2022 1100   K 4.4 08/14/2022 1100   CL 103 08/14/2022 1100   CO2 20 08/14/2022 1100   GLUCOSE 92 08/14/2022 1100   GLUCOSE 108 (H) 03/12/2022 1422   BUN 15 08/14/2022 1100   CREATININE 0.80 08/14/2022 1100   CALCIUM 9.5 08/14/2022 1100   PROT 7.1 08/14/2022 1100   ALBUMIN 4.6 08/14/2022 1100   AST 41 (H) 08/14/2022 1100   ALT 28 08/14/2022 1100   ALKPHOS 76 08/14/2022 1100   BILITOT 0.5 08/14/2022 1100       Component Value Date/Time   WBC 5.7 08/14/2022 1100   WBC 9.1 03/12/2022 1422   RBC 5.04 08/14/2022 1100   RBC 4.81 03/12/2022 1422   HGB 14.0 08/14/2022 1100   HCT 42.7 08/14/2022 1100   PLT 217 08/14/2022 1100   MCV 85 08/14/2022 1100   MCH 27.8 08/14/2022 1100   MCHC 32.8 08/14/2022 1100   MCHC 32.7 03/12/2022 1422   RDW 15.1 08/14/2022 1100   LYMPHSABS 1.4 08/14/2022 1100   MONOABS 0.6 03/12/2022 1422   EOSABS 0.1 08/14/2022 1100   BASOSABS 0.0 08/14/2022 1100    No results found for: "POCLITH", "LITHIUM"   No results found for: "PHENYTOIN", "PHENOBARB", "VALPROATE", "CBMZ"   .res Assessment: Plan:   Counseled patient regarding potential benefits, risks, and side effects of Lamictal to include  potential risk of Stevens-Johnson syndrome. Advised patient to stop taking Lamictal and contact office immediately if rash develops and to seek urgent medical attention if rash is severe and/or spreading quickly. Will start Lamictal 25 mg daily for 2 weeks, then increase to 50 mg daily for 2 weeks, then 100 mg daily for 2 weeks, then 150 mg daily for mood symptoms.  Continue Rexulti 2 mg po qd for depression and anxiety.  Continue Sertraline 100 mg po BID for anxiety and depression.  Continue Buspar 30 mg po BID for anxiety.  Continue Lorazepam 0.5 mg po qd prn anxiety. Pt to follow-up in 3 months or sooner if clinically indicated.  Recommend continuing therapy with Bambi Cottle, LCSW.  Patient advised to contact office with any questions, adverse effects, or acute worsening in signs and symptoms.   Jacqueline Fowler was seen today for depression and anxiety.  Diagnoses and all orders for this visit:  Depression, unspecified depression type -     lamoTRIgine (LAMICTAL) 25 MG tablet; Take 1 tablet (25 mg total) by mouth daily for 14 days, THEN 2 tablets (50 mg total) daily for 14 days. -     lamoTRIgine (LAMICTAL) 100 MG tablet; Take 1 tab po qd x 2 weeks, then increase to 1.5 tabs po qd -     brexpiprazole (REXULTI) 2 MG TABS tablet; Take 1 tablet (2 mg total) by mouth daily. -     sertraline (ZOLOFT) 100 MG tablet; Take 1 tablet (100 mg total) by mouth in the morning and at bedtime.  Anxiety disorder, unspecified type -     busPIRone (BUSPAR) 30 MG tablet; Take 1 tablet (30 mg total) by mouth 2 (two)  times daily. -     LORazepam (ATIVAN) 0.5 MG tablet; Take 1 tablet (0.5 mg total) by mouth daily as needed for anxiety (panic attack). -     propranolol (INDERAL) 10 MG tablet; TAKE 1 TO 2 TABLETS BY MOUTH TWICE A DAY AS NEEDED FOR ANXIETY -     sertraline (ZOLOFT) 100 MG tablet; Take 1 tablet (100 mg total) by mouth in the morning and at bedtime.  Binge eating disorder     Please see After Visit  Summary for patient specific instructions.  Future Appointments  Date Time Provider [facility]  10/09/2022 11:40 AM Dennard Nip D, MD [facility] None  10/17/2022  2:10 PM [name], MD [facility]  10/18/2022 10:00 AM Cottle, Lucious Groves, LCSW [facility] None  10/23/2022  9:00 AM Dennard Nip D, MD [facility] None  11/01/2022 10:00 AM Cottle, Lucious Groves, LCSW [facility] None  11/13/2022  9:20 AM Starlyn Skeans, MD [facility] None  12/26/2022  9:00 AM Thayer Headings, PMHNP [facility] None    No orders of the defined types were placed in this encounter.   -------------------------------

## 2022-10-04 ENCOUNTER — Ambulatory Visit: Payer: BC Managed Care – PPO | Admitting: Psychology

## 2022-10-05 ENCOUNTER — Other Ambulatory Visit (INDEPENDENT_AMBULATORY_CARE_PROVIDER_SITE_OTHER): Payer: Self-pay | Admitting: Family Medicine

## 2022-10-05 ENCOUNTER — Other Ambulatory Visit: Payer: Self-pay | Admitting: Psychiatry

## 2022-10-05 DIAGNOSIS — R7303 Prediabetes: Secondary | ICD-10-CM

## 2022-10-05 DIAGNOSIS — F419 Anxiety disorder, unspecified: Secondary | ICD-10-CM

## 2022-10-08 DIAGNOSIS — M545 Low back pain, unspecified: Secondary | ICD-10-CM | POA: Diagnosis not present

## 2022-10-08 DIAGNOSIS — M5416 Radiculopathy, lumbar region: Secondary | ICD-10-CM | POA: Diagnosis not present

## 2022-10-08 DIAGNOSIS — M5451 Vertebrogenic low back pain: Secondary | ICD-10-CM | POA: Diagnosis not present

## 2022-10-08 NOTE — Progress Notes (Signed)
Chief Complaint:   OBESITY Jacqueline Fowler is here to discuss her progress with her obesity treatment plan along with follow-up of her obesity related diagnoses. Magdelena is on keeping a food journal and adhering to recommended goals of 1300-1600 calories and 85+ grams of protein and states she is following her eating plan approximately 0% of the time. Xochilt states she is doing 0 minutes 0 times per week.  Today's visit was #: 4 Starting weight: 279 lbs Starting date: 08/14/2022 Today's weight: 272 lbs Today's date: 09/25/2022 Total lbs lost to date: 7 Total lbs lost since last in-office visit: 1  Interim History: Virgen has been struggling with depression and anxiety and she has struggled with "all of her none" concerns with diet, and she struggled to follow her eating plan.  Subjective:   1. Prediabetes Lakishia is taking metformin with no side effects noted.  2. Gastroesophageal reflux disease without esophagitis Nirvi is taking Protonix and Pepcid but she reports increased symptoms lately, and she notes waking up with symptoms.  She has a follow-up with GI scheduled for early December.  3. Depression with anxiety Marlei is seeing a counselor, and she is taking Zoloft, BuSpar, and Rexulti, and Ativan as needed.  Her counselor has felt she might benefit from a mood stabilizer.   Assessment/Plan:   1. Prediabetes Lashawndra will continue metformin, and she will continue with her healthy eating plan and exercise.  2. Gastroesophageal reflux disease without esophagitis Kaylor will continue with her healthy eating plan to promote weight loss.  She will continue with her medications and will follow-up with GI as scheduled.  3. Depression with anxiety We discussed considering the use of Topamax and Alley will discuss with her counselor as this likely could help with her mood as well as cravings, which she reports continues to be an issue.  4. Obesity, Current BMI 45.3 Ekaterina is currently in the  action stage of change. As such, her goal is to continue with weight loss efforts. She has agreed to keeping a food journal and adhering to recommended goals of 1300-1600 calories and 85+ grams of protein daily.   Exercise goals: All adults should avoid inactivity. Some physical activity is better than none, and adults who participate in any amount of physical activity gain some health benefits.  Behavioral modification strategies: increasing lean protein intake, decreasing simple carbohydrates, emotional eating strategies, and holiday eating strategies .  Bryer has agreed to follow-up with our clinic in 2 to 3 weeks. She was informed of the importance of frequent follow-up visits to maximize her success with intensive lifestyle modifications for her multiple health conditions.   Objective:   Blood pressure 119/75, pulse 71, temperature 98 F (36.7 C), height '5\' 5"'$  (1.651 m), weight 272 lb (123.4 kg), last menstrual period 05/15/2021, SpO2 95 %. Body mass index is 45.26 kg/m.  General: Cooperative, alert, well developed, in no acute distress. HEENT: Conjunctivae and lids unremarkable. Cardiovascular: Regular rhythm.  Lungs: Normal work of breathing. Neurologic: No focal deficits.   Lab Results  Component Value Date   CREATININE 0.80 08/14/2022   BUN 15 08/14/2022   NA 138 08/14/2022   K 4.4 08/14/2022   CL 103 08/14/2022   CO2 20 08/14/2022   Lab Results  Component Value Date   ALT 28 08/14/2022   AST 41 (H) 08/14/2022   ALKPHOS 76 08/14/2022   BILITOT 0.5 08/14/2022   Lab Results  Component Value Date   HGBA1C 5.6 08/14/2022  Lab Results  Component Value Date   INSULIN 16.0 08/14/2022   Lab Results  Component Value Date   TSH 3.250 08/14/2022   Lab Results  Component Value Date   CHOL 176 08/14/2022   HDL 44 08/14/2022   LDLCALC 108 (H) 08/14/2022   TRIG 135 08/14/2022   Lab Results  Component Value Date   VD25OH 59.8 08/14/2022   Lab Results  Component  Value Date   WBC 5.7 08/14/2022   HGB 14.0 08/14/2022   HCT 42.7 08/14/2022   MCV 85 08/14/2022   PLT 217 08/14/2022   Lab Results  Component Value Date   IRON 63 04/26/2021   TIBC 418 04/26/2021   FERRITIN 116.4 04/26/2021   Attestation Statements:   Reviewed by clinician on day of visit: allergies, medications, problem list, medical history, surgical history, family history, social history, and previous encounter notes.  Time spent on visit including pre-visit chart review and post-visit care and charting was 30 minutes.   I, Trixie Dredge, am acting as transcriptionist for Dennard Nip, MD.  I have reviewed the above documentation for accuracy and completeness, and I agree with the above. -  Dennard Nip, MD

## 2022-10-09 ENCOUNTER — Ambulatory Visit (INDEPENDENT_AMBULATORY_CARE_PROVIDER_SITE_OTHER): Payer: BC Managed Care – PPO | Admitting: Family Medicine

## 2022-10-09 VITALS — BP 106/74 | HR 86 | Temp 98.1°F | Ht 65.0 in | Wt 274.0 lb

## 2022-10-09 DIAGNOSIS — E669 Obesity, unspecified: Secondary | ICD-10-CM

## 2022-10-09 DIAGNOSIS — Z6841 Body Mass Index (BMI) 40.0 and over, adult: Secondary | ICD-10-CM

## 2022-10-09 DIAGNOSIS — R7303 Prediabetes: Secondary | ICD-10-CM

## 2022-10-09 DIAGNOSIS — M545 Low back pain, unspecified: Secondary | ICD-10-CM | POA: Diagnosis not present

## 2022-10-09 DIAGNOSIS — M6281 Muscle weakness (generalized): Secondary | ICD-10-CM | POA: Diagnosis not present

## 2022-10-09 DIAGNOSIS — E559 Vitamin D deficiency, unspecified: Secondary | ICD-10-CM | POA: Diagnosis not present

## 2022-10-09 MED ORDER — METFORMIN HCL 500 MG PO TABS: 500.0000 mg | ORAL_TABLET | Freq: Two times a day (BID) | ORAL | 0 refills | Status: DC

## 2022-10-15 ENCOUNTER — Ambulatory Visit (INDEPENDENT_AMBULATORY_CARE_PROVIDER_SITE_OTHER): Payer: BC Managed Care – PPO | Admitting: Psychology

## 2022-10-15 DIAGNOSIS — F32A Depression, unspecified: Secondary | ICD-10-CM

## 2022-10-15 DIAGNOSIS — F5081 Binge eating disorder: Secondary | ICD-10-CM

## 2022-10-15 DIAGNOSIS — M7752 Other enthesopathy of left foot: Secondary | ICD-10-CM | POA: Diagnosis not present

## 2022-10-15 DIAGNOSIS — F419 Anxiety disorder, unspecified: Secondary | ICD-10-CM

## 2022-10-15 DIAGNOSIS — M2042 Other hammer toe(s) (acquired), left foot: Secondary | ICD-10-CM | POA: Diagnosis not present

## 2022-10-15 NOTE — Progress Notes (Signed)
[facility] Counselor/Therapist Progress Note  Patient ID: Jacqueline Fowler, MRN: [id number],    Date: 10/15/2022  Time Spent: 60 minutes  Treatment Type: Individual Therapy  Reported Symptoms: depression  Mental Status Exam: Appearance:  Casual     Behavior: Appropriate  Motor: Normal  Speech/Language:  Normal Rate  Affect: Blunt  Mood: Depressed  Thought process: normal  Thought content:   WNL  Sensory/Perceptual disturbances:   WNL  Orientation: oriented to person, place, time/date, and situation  Attention: Good  Concentration: Good  Memory: WNL  Fund of knowledge:  Good  Insight:   Good  Judgment:  Good  Impulse Control: Good   Risk Assessment: Danger to Self:  No Self-injurious Behavior: No Danger to Others: No Duty to Warn:no Physical Aggression / Violence:No  Access to Firearms a concern: No  Gang Involvement:No   Subjective: The patient attended a face-to-face individual therapy session via video visit today.  The patient gave verbal consent for the session to be on video on WebEx.  The patient was in her home alone and the therapist was in the office.  The patient presents with a blunted affect and her mood is depressed.  The patient seems to be more depressed this week.  She is thinking about family and has a tendency to get caught up in why they are not involved in her life.  We talked about them not being involved in her life because that is what they chose.  She has reached out to her mother and she reached out to her son and has not heard back from either one of them.  I reframed how to think about this situation with her and she is straight still struggling with thinking that there is something wrong with her.  I encouraged her to look at it in a different way and hopefully she is able to make some changes in how she perceives the situation. Interventions: Cognitive Behavioral Therapy  Diagnosis:Depression, unspecified depression type  Binge  eating disorder  Anxiety disorder, unspecified type  Plan: Client Abilities/Strengths  Intelligent, motivated, insightful  Client Treatment Preferences  Outpatient Individual therapy  Client Statement of Needs  "I need some help with my depression"  Treatment Level  Outpatient Individual therapy  Symptoms  Depressed or irritable mood.:  (Status: Improved). Feelings of hopelessness,  worthlessness, or inappropriate guilt.: (Status: maintained). History of chronic  or recurrent depression for which the client has taken antidepressant medication, been hospitalized, had outpatient treatment, or had a course of electroconvulsive therapy.: (Status:  maintained). Lack of energy.:  (Status: maintained). Low self-esteem.:  (Status: maintained). Poor concentration and indecisiveness.: (Status: maintained). Sleeplessness or hypersomnia.: (Status:  maintained). Social withdrawal.: (Status: maintained).  Problems Addressed  Unipolar Depression, Unipolar Depression    Goals 1. Develop healthy interpersonal relationships that lead to the alleviation  and help prevent the relapse of depression. 2. Develop healthy thinking patterns and beliefs about self, others, and the world that lead to the alleviation and help prevent the relapse of  depression. Objective Verbalize an understanding of healthy and unhealthy emotions with the intent of increasing the use of  healthy emotions to guide actions. Target Date: 2023-05-02 Frequency: Weekly Progress: 40Modality: individual Objective Learn and implement behavioral strategies to overcome depression. Target Date: 2023-05-02 Frequency: Weekly Progress: 40 Modality: individual Related Interventions 1. Assist the client in developing skills that increase the likelihood of deriving pleasure from  behavioral activation (e.g., assertiveness skills, developing an exercise plan, less internal/more  external focus, increased social involvement); reinforce  success. Objective Describe current and past experiences with depression including their impact on functioning and  attempts to resolve it. Target Date: 2023-05-02 Frequency: Weekly Progress: 40 Modality: individual Related Interventions 1. Encourage the client to share his/her thoughts and feelings of depression; express empathy and  build rapport while identifying primary cognitive, behavioral, interpersonal, or other  contributors to depression.  Objective Learn and implement problem-solving and decision-making skills. Target Date: 2023-05-02 Frequency: Weekly Progress: 40 Modality: individual Related Interventions 1. Encourage in the client the development of a positive problem orientation in which problems  and solving them are viewed as a natural part of life and not something to be feared, despaired,  or avoided. Objective Identify and replace thoughts and beliefs that support depression. Target Date: 2023-05-02 Frequency: Weekly Progress: 10 Modality: individual Related Interventions 1. Conduct Cognitive-Behavioral Therapy (see Cognitive Behavior Therapy by Olevia Bowens; Overcoming Depression by Lynita Lombard al.), beginning with helping the client learn the connection among  cognition, depressive feelings, and actions. 2. Facilitate and reinforce the client's shift from biased depressive self-talk and beliefs to realitybased cognitive messages that enhance self-confidence and increase adaptive actions (see  "Positive Self-Talk" in the Adult Psychotherapy Homework Planner by Bryn Gulling). Diagnosis Axis  none 296.32 (Major depressive affective disorder, recurrent episode, moderate) - Open -  [Signifier: n/a]  Conditions For Discharge Achievement of treatment goals and objectives Will continue to see the patient at least biweekly and work with her using CBT, Insight oriented approach and EMDR.  Patient approved the treatment plan.  Jackalynn Art G Daleiza Bacchi,  LCSW

## 2022-10-16 DIAGNOSIS — D225 Melanocytic nevi of trunk: Secondary | ICD-10-CM | POA: Diagnosis not present

## 2022-10-16 DIAGNOSIS — C44319 Basal cell carcinoma of skin of other parts of face: Secondary | ICD-10-CM | POA: Diagnosis not present

## 2022-10-17 ENCOUNTER — Other Ambulatory Visit: Payer: Self-pay

## 2022-10-17 ENCOUNTER — Encounter: Payer: Self-pay | Admitting: Gastroenterology

## 2022-10-17 ENCOUNTER — Other Ambulatory Visit (INDEPENDENT_AMBULATORY_CARE_PROVIDER_SITE_OTHER): Payer: BC Managed Care – PPO

## 2022-10-17 ENCOUNTER — Ambulatory Visit (INDEPENDENT_AMBULATORY_CARE_PROVIDER_SITE_OTHER): Payer: BC Managed Care – PPO | Admitting: Gastroenterology

## 2022-10-17 ENCOUNTER — Telehealth: Payer: Self-pay

## 2022-10-17 VITALS — BP 118/78 | HR 85 | Ht 65.0 in | Wt 278.6 lb

## 2022-10-17 DIAGNOSIS — K449 Diaphragmatic hernia without obstruction or gangrene: Secondary | ICD-10-CM

## 2022-10-17 DIAGNOSIS — K7460 Unspecified cirrhosis of liver: Secondary | ICD-10-CM

## 2022-10-17 DIAGNOSIS — K769 Liver disease, unspecified: Secondary | ICD-10-CM

## 2022-10-17 DIAGNOSIS — K219 Gastro-esophageal reflux disease without esophagitis: Secondary | ICD-10-CM

## 2022-10-17 DIAGNOSIS — K580 Irritable bowel syndrome with diarrhea: Secondary | ICD-10-CM | POA: Diagnosis not present

## 2022-10-17 LAB — CBC WITH DIFFERENTIAL/PLATELET
Basophils Absolute: 0.1 10*3/uL (ref 0.0–0.1)
Basophils Relative: 1 % (ref 0.0–3.0)
Eosinophils Absolute: 0 10*3/uL (ref 0.0–0.7)
Eosinophils Relative: 0 % (ref 0.0–5.0)
HCT: 42.8 % (ref 36.0–46.0)
Hemoglobin: 14.4 g/dL (ref 12.0–15.0)
Lymphocytes Relative: 21.2 % (ref 12.0–46.0)
Lymphs Abs: 1.6 10*3/uL (ref 0.7–4.0)
MCHC: 33.6 g/dL (ref 30.0–36.0)
MCV: 83.6 fl (ref 78.0–100.0)
Monocytes Absolute: 0.5 10*3/uL (ref 0.1–1.0)
Monocytes Relative: 6 % (ref 3.0–12.0)
Neutro Abs: 5.4 10*3/uL (ref 1.4–7.7)
Neutrophils Relative %: 71.8 % (ref 43.0–77.0)
Platelets: 273 10*3/uL (ref 150.0–400.0)
RBC: 5.12 Mil/uL — ABNORMAL HIGH (ref 3.87–5.11)
RDW: 14.5 % (ref 11.5–15.5)
WBC: 7.5 10*3/uL (ref 4.0–10.5)

## 2022-10-17 LAB — COMPREHENSIVE METABOLIC PANEL
ALT: 23 U/L (ref 0–35)
AST: 27 U/L (ref 0–37)
Albumin: 4.4 g/dL (ref 3.5–5.2)
Alkaline Phosphatase: 81 U/L (ref 39–117)
BUN: 14 mg/dL (ref 6–23)
CO2: 27 mEq/L (ref 19–32)
Calcium: 9.7 mg/dL (ref 8.4–10.5)
Chloride: 102 mEq/L (ref 96–112)
Creatinine, Ser: 0.77 mg/dL (ref 0.40–1.20)
GFR: 89.39 mL/min (ref >60.00)
Glucose, Bld: 94 mg/dL (ref 70–99)
Potassium: 4.1 mEq/L (ref 3.5–5.1)
Sodium: 137 mEq/L (ref 135–145)
Total Bilirubin: 0.5 mg/dL (ref 0.2–1.2)
Total Protein: 7.4 g/dL (ref 6.0–8.3)

## 2022-10-17 LAB — PROTIME-INR
INR: 1 ratio (ref 0.8–1.0)
Prothrombin Time: 11.5 s (ref 9.6–13.1)

## 2022-10-17 MED ORDER — PANTOPRAZOLE SODIUM 40 MG PO TBEC: 40.0000 mg | DELAYED_RELEASE_TABLET | Freq: Two times a day (BID) | ORAL | 3 refills | Status: DC

## 2022-10-17 NOTE — Telephone Encounter (Signed)
Reminder received in Epic for pt to have repeat MRI and have labs done: Orders placed in Epic for MRI and Labs; Message sent to schedulers to schedule MRI   Spoke with Pt husband Cecilie Lowers and made aware of Dr. Lyndel Safe recommendations:  Cecilie Lowers notified that the schedulers will be reaching out with a date and time for the MRI that has been ordered: Cecilie Lowers notified that orders for labs have been placed: Location to lab provided: Cecilie Lowers verbalized understanding with all questions answered.

## 2022-10-17 NOTE — Patient Instructions (Addendum)
_______________________________________________________  If you are age 51 or older, your body mass index should be between 23-30. Your Body mass index is 46.36 kg/m. If this is out of the aforementioned range listed, please consider follow up with your Primary Care Provider.  If you are age 6 or younger, your body mass index should be between 19-25. Your Body mass index is 46.36 kg/m. If this is out of the aformentioned range listed, please consider follow up with your Primary Care Provider.   ________________________________________________________  The [facility] providers would like to encourage you to use [facility] to communicate with providers for non-urgent requests or questions.  Due to long hold times on the telephone, sending your provider a message by [facility] may be a faster and more efficient way to get a response.  Please allow 48 business hours for a response.  Please remember that this is for non-urgent requests.  _______________________________________________________  Your provider has requested that you go to the basement level for lab work before leaving today. Press "B" on the elevator. The lab is located at the first door on the left as you exit the elevator.  We have sent the following medications to your pharmacy for you to pick up at your convenience: Pepcid '40mg'$  at bedtime daily  Please follow up in 6 months. Give [place] a call at [phone] to schedule an appointment.  You have been scheduled for an MRI at [facility] on 10-21-2022. Your appointment time is 7pm. Please arrive to admitting (at main entrance of the hospital) 30 minutes prior to your appointment time for registration purposes. Please make certain not to have anything to eat or drink 6 hours prior to your test. In addition, if you have any metal in your body, have a pacemaker or defibrillator, please be sure to let your ordering physician know. This test typically takes 45 minutes to 1 hour to  complete. Should you need to reschedule, please call [phone] to do so.  Thank you,  Dr. [name]

## 2022-10-17 NOTE — Progress Notes (Signed)
Chief Complaint: FU  Referring Provider:  Bonnita Nasuti, MD      ASSESSMENT AND PLAN;   #1. Liver cirrhosis (Child's class A) on CT 04/2021 with mild splenomegaly, likely d/t NAFLD. No ETOH. No EV on EGD 06/2021.  #2. GERD with small HH and eso stricture s/p dil.  #3. IBS-D. Assoc Post-chole diarrhea, neg colon with Bx 01/2019. Stool studies + Campylobacter.  Treated with azithromycin  #4. H/O tubular adenoma 01/2019. Rpt in 5 yrs (01/2024).  #5. Incidental liver lesion 14 mm (LI-RADS 3)  with stable mesenteric adenopahy on MRI liver 07/2022  Plan: -Continue Protonix '40mg'$  po BID -pepcid '40mg'$  po QHS #30. -If still with reflux at night, then add carafate. -CBC, CMP, AFP, INR -Rpt MRI with contrast as suggested by radiology. -Continue inderal '10mg'$  po BID (previously started d/t tachycardia) -Wt loss clinic.  -Continue wt loss.  Watch calorie intake.  Avoid hepatotoxic meds. -Nonpharmacologic means of reflux control. -FU 6 months   HPI:    Jacqueline Fowler is a 51 y.o. female  For follow-up visit. To be scheduled for follow-up blood draw and follow-up MRI.  Overall doing much better.  Still has reflux symptoms which are better than before.  About 2-3 times a week she would have "acid buildup" and would have "acid vomiting" at night.  Better since she has been on twice daily Protonix and 20 mg nightly Pepcid.  No odynophagia or dysphagia.   Multiple GI problems  -Reflux.  Somewhat better with Protonix twice daily, Pepcid 20 mg p.o. nightly.  Still with nocturnal symptoms.  Denies having any history suggestive of sleep apnea.  No dysphagia or odynophagia.  Has not been eating late.  -Had CT followed by MRI liver showing hepatomegaly with evidence of liver cirrhosis, steatosis, splenomegaly with small varices.  14 mm LI-RADS 3 lesion.  Recommend repeat MRI with contrast in 3 months.  Normal AFP.  -Has gained weight as below.  -Has diarrhea at baseline 2-3/day, after eating,  without any nocturnal symptoms.  -No nausea, vomiting, heartburn, regurgitation, odynophagia. No significant constipation.  No melena or hematochezia. No abdominal pain.  -No H/O itching, skin lesions, easy bruisability, intake of OTC meds including diet pills, herbal medications, anabolic steroids or Tylenol. There is no H/O blood transfusions, IVDA or FH of liver disease. No jaundice, dark urine or pale stools. No alcohol abuse.   Wt Readings from Last 3 Encounters:  10/17/22 278 lb 9.6 oz (126.4 kg)  10/09/22 274 lb (124.3 kg)  09/25/22 272 lb (123.4 kg)   Past GI work-up: MRI liver with contrast 07/16/2022 1. Stable LI-RADS category LR-3 lesion posteriorly in the right hepatic lobe. In most cases, surveillance hepatic protocol MR imaging with and without contrast in 3-6 months is indicated. 2. Hepatic cirrhosis with reactive porta hepatis lymph nodes. Mild splenomegaly. 3.  Aortic Atherosclerosis (ICD10-I70.0).  MRI liver with and without contrast 04/2022 IMPRESSION: 1. Hepatomegaly with evidence of hepatic cirrhosis and steatosis. 2. 14 mm enhancing lesion in the posterior right hepatic lobe without washout, LI-RADS 3. Consider repeat imaging in 3-6 months. 3. Splenomegaly and small varices. 4. Small hiatal hernia. 5. Stable enlarged lymph nodes in the upper abdomen.  EGD with dil 06/13/2021 - Benign-appearing esophageal stenosis. Dilated. Bx- neg EoE - Small HH - Gastritis. Bx- neg HP - Normal examined duodenum. Bx- neg celiac  CT AP with contrast 04/25/2021 1. Cirrhotic hepatic morphology with evidence of portal hypertension including splenomegaly. 2. Enlarged upper abdominal  lymph nodes, nonspecific and in the setting of hepatocellular disease are possibly reactive. 3. Punctate nonobstructive left renal stone. 4. Small hiatal hernia. Enteric contrast within the distal esophagus may reflect gastroesophageal reflux or esophageal dysmotility. 5. Aortic Atherosclerosis  (ICD10-I70.0).  Colonoscopy 01/2019 -Colonic polyp s/p polypectomy. Bx- SSA. Rpt in 5 yrs -Small internal hemorrhoids. -Otherwise normal colonoscopy to TI. -Neg random colon biopsies for microscopic colitis. -Neg random TI biopsies.  [place] 08/2021 1. Patent hepatic vasculature with normal velocities and directional flow. 2. Increased, slightly coarsened echogenicity of the hepatic parenchyma with nodularity hepatic contour compatible with provided history of cirrhosis. No discrete hepatic lesions though further evaluation with abdominal MRI could be performed as indicated. 3. Borderline splenomegaly as could be seen in the setting of portal venous hypertension. No ascites. 4. Punctate (approximately 6 mm) nonobstructing left-sided renal stone, as demonstrated on abdominal CT performed 04/2021. 5. Post cholecystectomy.  WU for liver disease 04/2021 -Nl AFP, ceruloplasmin. Neg acute hepatitis profile.  Not immune to hepatitis A/B.  Got first vaccine. -Iron saturation 15% -LFTs: Alb 4.0, AST 23, ALT 26, TB 0.4 -PT INR 1.1 -ANA +ve 1:160 -Neg celiac screen -ASMA <20 -IgG Nl  CT 04/2009 -Fatty liver Past Medical History:  Diagnosis Date   Anxiety    B12 deficiency    Back pain    Chest pain    Cirrhosis of liver (HCC)    Depression    Fatty liver    Gallbladder problem    Gallstones 2004   GERD (gastroesophageal reflux disease)    History of stomach ulcers    IBS (irritable bowel syndrome)    Joint pain    Obesity    Pre-diabetes    Swallowing difficulty    UTI (urinary tract infection)    Vitamin D deficiency     Past Surgical History:  Procedure Laterality Date   BREAST BIOPSY Right    age 89 or 39 cyst that was removed   CHOLECYSTECTOMY  2004   COLONOSCOPY  08/07/2009   Small internal hemorrhoids. Otherwise normal colonoscopy to TI.    ESOPHAGOGASTRODUODENOSCOPY  05/31/2008   Esophageal stricture status post dilation. Mild gastritis.    esophagus stretching      IUD REMOVAL     REPAIR RECTOCELE     01/22/22   TOTAL VAGINAL HYSTERECTOMY     tummy tuck  03/20/2020   UPPER GASTROINTESTINAL ENDOSCOPY      Family History  Problem Relation Age of Onset   Depression Mother    Anxiety disorder Mother    Anxiety disorder Father    Depression Father    Heart disease Father    Depression Son    Colon cancer Neg Hx    Esophageal cancer Neg Hx    Rectal cancer Neg Hx    Stomach cancer Neg Hx     Social History   Tobacco Use   Smoking status: Former   Smokeless tobacco: Never   Tobacco comments:    quit 2001 smoking  Vaping Use   Vaping Use: Never used  Substance Use Topics   Alcohol use: Not Currently    Alcohol/week: 1.0 standard drink of alcohol    Types: 1 Glasses of wine per week    Comment: ocassionally   Drug use: Never    Current Outpatient Medications  Medication Sig Dispense Refill   acetaminophen (TYLENOL) 500 MG tablet Take 500 mg by mouth as needed.     brexpiprazole (REXULTI) 2 MG TABS tablet Take 1  tablet (2 mg total) by mouth daily. 90 tablet 1   busPIRone (BUSPAR) 30 MG tablet Take 1 tablet (30 mg total) by mouth 2 (two) times daily. 180 tablet 1   Cholecalciferol (VITAMIN D3 PO) Take by mouth.     cyclobenzaprine (FLEXERIL) 5 MG tablet Take 5 mg by mouth 3 (three) times daily.     dicyclomine (BENTYL) 10 MG capsule TAKE 1 CAPSULE BY MOUTH 2 TO 3 TIMES A DAY 270 capsule 1   famotidine (PEPCID) 20 MG tablet TAKE 1 TABLET BY MOUTH EVERYDAY AT BEDTIME 90 tablet 4   hyoscyamine (LEVBID) 0.375 MG 12 hr tablet TAKE 1 TABLET (0.375 MG TOTAL) BY MOUTH 2 (TWO) TIMES DAILY. 180 tablet 2   lamoTRIgine (LAMICTAL) 100 MG tablet Take 1 tab po qd x 2 weeks, then increase to 1.5 tabs po qd 45 tablet 2   lamoTRIgine (LAMICTAL) 25 MG tablet Take 1 tablet (25 mg total) by mouth daily for 14 days, THEN 2 tablets (50 mg total) daily for 14 days. 45 tablet 0   loperamide (IMODIUM) 2 MG capsule Take by mouth as needed for diarrhea or loose  stools.     LORazepam (ATIVAN) 0.5 MG tablet Take 1 tablet (0.5 mg total) by mouth daily as needed for anxiety (panic attack). 15 tablet 0   metFORMIN (GLUCOPHAGE) 500 MG tablet Take 1 tablet (500 mg total) by mouth 2 (two) times daily with a meal. 60 tablet 0   methocarbamol (ROBAXIN) 500 MG tablet Take 500 mg by mouth as needed for muscle spasms.     Multiple Vitamins-Minerals (CENTRUM SILVER 50+WOMEN PO) Take by mouth.     ondansetron (ZOFRAN ODT) 4 MG disintegrating tablet Take 1 tablet (4 mg total) by mouth every 8 (eight) hours as needed for nausea or vomiting. 25 tablet 0   oxyCODONE-acetaminophen (PERCOCET/ROXICET) 5-325 MG tablet Take 1 tablet by mouth every 6 (six) hours as needed.     pantoprazole (PROTONIX) 40 MG tablet Take 1 tablet (40 mg total) by mouth 2 (two) times daily. Take half hour before breakfast and half hour before supper. 180 tablet 1   propranolol (INDERAL) 10 MG tablet TAKE 1 TO 2 TABLETS BY MOUTH TWICE A DAY AS NEEDED FOR ANXIETY 360 tablet 0   Pyridoxine HCl (VITAMIN B-6 PO) Take by mouth.     sertraline (ZOLOFT) 100 MG tablet Take 1 tablet (100 mg total) by mouth in the morning and at bedtime. 180 tablet 1   Current Facility-Administered Medications  Medication Dose Route Frequency Provider Last Rate Last Admin   0.9 %  sodium chloride infusion  500 mL Intravenous Once [name], MD        No Known Allergies  Review of Systems:  neg     Physical Exam:    BP 118/78   Pulse 85   Ht '5\' 5"'$  (1.651 m)   Wt 278 lb 9.6 oz (126.4 kg)   LMP 05/15/2021 (Approximate)   SpO2 97%   BMI 46.36 kg/m  Wt Readings from Last 3 Encounters:  10/17/22 278 lb 9.6 oz (126.4 kg)  10/09/22 274 lb (124.3 kg)  09/25/22 272 lb (123.4 kg)   Constitutional:  Well-developed, in no acute distress. Psychiatric: Normal mood and affect. Behavior is normal. HEENT: Pupils normal.  Conjunctivae are normal. No scleral icterus. Cardiovascular: Normal rate, regular rhythm. No  edema Pulmonary/chest: Effort normal and breath sounds normal. No wheezing, rales or rhonchi. Abdominal: Soft, nondistended. Nontender. Bowel sounds active throughout. There  are no masses palpable. No hepatomegaly. Rectal: Deferred Neurological: Alert and oriented to person place and time. Skin: Skin is warm and dry. No rashes noted.  Data Reviewed: I have personally reviewed following labs and imaging studies  CBC:    Latest Ref Rng & Units 08/14/2022   11:00 AM 03/12/2022    2:22 PM 06/13/2021    8:32 AM  CBC  WBC 3.4 - 10.8 x10E3/uL 5.7  9.1  4.9   Hemoglobin 11.1 - 15.9 g/dL 14.0  12.9  14.7   Hematocrit 34.0 - 46.6 % 42.7  39.5  43.9   Platelets 150 - 450 x10E3/uL 217  254.0  194.0     CMP:    Latest Ref Rng & Units 08/14/2022   11:00 AM 03/12/2022    2:22 PM 06/13/2021    8:32 AM  CMP  Glucose 70 - 99 mg/dL 92  108  95   BUN 6 - 24 mg/dL '15  14  20   '$ Creatinine 0.57 - 1.00 mg/dL 0.80  1.05  0.83   Sodium 134 - 144 mmol/L 138  137  137   Potassium 3.5 - 5.2 mmol/L 4.4  4.1  4.4   Chloride 96 - 106 mmol/L 103  107  103   CO2 20 - 29 mmol/L '20  22  22   '$ Calcium 8.7 - 10.2 mg/dL 9.5  9.0  9.7   Total Protein 6.0 - 8.5 g/dL 7.1  7.1  7.3   Total Bilirubin 0.0 - 1.2 mg/dL 0.5  0.4  0.6   Alkaline Phos 44 - 121 IU/L 76  76  52   AST 0 - 40 IU/L 41  32  23   ALT 0 - 32 IU/L '28  30  21      '$ Radiology Studies: No results found.    Carmell Austria, MD 10/17/2022, 2:27 PM  Cc: Bonnita Nasuti, MD

## 2022-10-17 NOTE — Telephone Encounter (Signed)
-----   Message from Gillermina Hu, RN sent at 07/18/2022 10:18 AM EDT ----- [name], MD  Gillermina Hu, RN Stable  Rpt MRI with contrast in 3 months  Rpt AFP, cbc, CMP at that time  [facility]  Reminder Sent 07/18/2022

## 2022-10-17 NOTE — Addendum Note (Signed)
Addended by: Curlene Labrum E on: 10/17/2022 03:20 PM   Modules accepted: Orders

## 2022-10-18 ENCOUNTER — Ambulatory Visit: Payer: BC Managed Care – PPO | Admitting: Psychology

## 2022-10-18 LAB — AFP TUMOR MARKER: AFP-Tumor Marker: 4 ng/mL

## 2022-10-20 ENCOUNTER — Encounter (INDEPENDENT_AMBULATORY_CARE_PROVIDER_SITE_OTHER): Payer: Self-pay | Admitting: Family Medicine

## 2022-10-21 ENCOUNTER — Other Ambulatory Visit: Payer: Self-pay | Admitting: Psychiatry

## 2022-10-21 ENCOUNTER — Ambulatory Visit (HOSPITAL_COMMUNITY)
Admission: RE | Admit: 2022-10-21 | Discharge: 2022-10-21 | Disposition: A | Discharge status: Home / Self Care | Payer: BC Managed Care – PPO | Source: Ambulatory Visit | Attending: Gastroenterology | Admitting: Gastroenterology

## 2022-10-21 DIAGNOSIS — K7460 Unspecified cirrhosis of liver: Secondary | ICD-10-CM | POA: Insufficient documentation

## 2022-10-21 DIAGNOSIS — K760 Fatty (change of) liver, not elsewhere classified: Secondary | ICD-10-CM | POA: Diagnosis not present

## 2022-10-21 DIAGNOSIS — F32A Depression, unspecified: Secondary | ICD-10-CM

## 2022-10-21 DIAGNOSIS — K769 Liver disease, unspecified: Secondary | ICD-10-CM | POA: Diagnosis not present

## 2022-10-21 MED ORDER — GADOBUTROL 1 MMOL/ML IV SOLN
10.0000 mL | Freq: Once | INTRAVENOUS | Status: AC | PRN
  Administered 2022-10-21: 10 mL via INTRAVENOUS

## 2022-10-21 NOTE — Progress Notes (Unsigned)
Chief Complaint:   OBESITY Jacqueline Fowler is here to discuss her progress with her obesity treatment plan along with follow-up of her obesity related diagnoses. Jacqueline Fowler is on following a lower carbohydrate, vegetable and lean protein rich diet plan and states she is following her eating plan approximately 25% of the time. Jacqueline Fowler states she is doing 0 minutes 0 times per week.  Today's visit was #: 5 Starting weight: 279 lbs Starting date: 08/14/2022 Today's weight: 274 lbs Today's date: 10/09/2022 Total lbs lost to date: 5 Total lbs lost since last in-office visit: 0  Interim History: Jacqueline Fowler did some celebration eating over Thanksgiving. She tried to portion control, but she continues to struggle with all or nothing behaviors.   Subjective:   1. Prediabetes Jacqueline Fowler is working on decreasing simple carbohydrates. No side effects were noted. She has no signs of hypoglycemia.   2. Vitamin D deficiency Jacqueline Fowler's Vitamin D level was at goal. She denies nausea, vomiting, or muscle weakness.   Assessment/Plan:   1. Prediabetes Jacqueline Fowler will continue metformin 500 mg BID, and we will refill for 1 month. We will recheck labs in 2 months.   - metFORMIN (GLUCOPHAGE) 500 MG tablet; Take 1 tablet (500 mg total) by mouth 2 (two) times daily with a meal.  Dispense: 60 tablet; Refill: 0  2. Vitamin D deficiency Jacqueline Fowler will continue Vitamin D, and we will recheck labs in 2 months.   3. Obesity, Current BMI 45.7 Jacqueline Fowler is currently in the action stage of change. As such, her goal is to continue with weight loss efforts. She has agreed to following a lower carbohydrate, vegetable and lean protein rich diet plan.   Behavioral modification strategies: increasing lean protein intake.  Jacqueline Fowler has agreed to follow-up with our clinic in 3 weeks. She was informed of the importance of frequent follow-up visits to maximize her success with intensive lifestyle modifications for her multiple health conditions.   Objective:    Blood pressure 106/74, pulse 86, temperature 98.1 F (36.7 C), height '5\' 5"'$  (1.651 m), weight 274 lb (124.3 kg), last menstrual period 05/15/2021, SpO2 95 %. Body mass index is 45.6 kg/m.  General: Cooperative, alert, well developed, in no acute distress. HEENT: Conjunctivae and lids unremarkable. Cardiovascular: Regular rhythm.  Lungs: Normal work of breathing. Neurologic: No focal deficits.   Lab Results  Component Value Date   CREATININE 0.77 10/17/2022   BUN 14 10/17/2022   NA 137 10/17/2022   K 4.1 10/17/2022   CL 102 10/17/2022   CO2 27 10/17/2022   Lab Results  Component Value Date   ALT 23 10/17/2022   AST 27 10/17/2022   ALKPHOS 81 10/17/2022   BILITOT 0.5 10/17/2022   Lab Results  Component Value Date   HGBA1C 5.6 08/14/2022   Lab Results  Component Value Date   INSULIN 16.0 08/14/2022   Lab Results  Component Value Date   TSH 3.250 08/14/2022   Lab Results  Component Value Date   CHOL 176 08/14/2022   HDL 44 08/14/2022   LDLCALC 108 (H) 08/14/2022   TRIG 135 08/14/2022   Lab Results  Component Value Date   VD25OH 59.8 08/14/2022   Lab Results  Component Value Date   WBC 7.5 10/17/2022   HGB 14.4 10/17/2022   HCT 42.8 10/17/2022   MCV 83.6 10/17/2022   PLT 273.0 10/17/2022   Lab Results  Component Value Date   IRON 63 04/26/2021   TIBC 418 04/26/2021   FERRITIN 116.4  04/26/2021   Attestation Statements:   Reviewed by clinician on day of visit: allergies, medications, problem list, medical history, surgical history, family history, social history, and previous encounter notes.   I, Trixie Dredge, am acting as transcriptionist for Dennard Nip, MD.  I have reviewed the above documentation for accuracy and completeness, and I agree with the above. -  Dennard Nip, MD

## 2022-10-21 NOTE — Telephone Encounter (Signed)
Please reschedule appt

## 2022-10-23 ENCOUNTER — Ambulatory Visit (INDEPENDENT_AMBULATORY_CARE_PROVIDER_SITE_OTHER): Payer: BC Managed Care – PPO | Admitting: Family Medicine

## 2022-10-25 ENCOUNTER — Encounter: Payer: Self-pay | Admitting: Gastroenterology

## 2022-10-28 ENCOUNTER — Telehealth: Payer: Self-pay | Admitting: Gastroenterology

## 2022-10-28 DIAGNOSIS — M545 Low back pain, unspecified: Secondary | ICD-10-CM | POA: Diagnosis not present

## 2022-10-28 DIAGNOSIS — M6281 Muscle weakness (generalized): Secondary | ICD-10-CM | POA: Diagnosis not present

## 2022-10-28 NOTE — Telephone Encounter (Signed)
Inbound call from patient requesting a call back from nurse to discuss MRI results, Tumor Marker results and issues with acid reflux. Please advise.

## 2022-10-29 ENCOUNTER — Telehealth: Payer: Self-pay | Admitting: Psychiatry

## 2022-10-29 DIAGNOSIS — F32A Depression, unspecified: Secondary | ICD-10-CM

## 2022-10-29 NOTE — Telephone Encounter (Signed)
Pt questioned MRI results: MRI results reviewed with pt and documented in result notes:  Pt stated that her reflux is bad as it has ever been: Pt stated that she increased her Pepcid from '20mg'$  to '40mg'$  since last office visit as instructed, Protonix 20 mg  twice a day, Mylanta, Tums: Pt stated that she is walking up chocking on the reflux, coughing associated with vomiting from reflux at least 3 nights a week: Pt stated that she is also worried about the enamel on her teeth. Pt states that something has to be done  Please advise

## 2022-10-30 NOTE — Telephone Encounter (Signed)
Has new dose

## 2022-10-31 ENCOUNTER — Other Ambulatory Visit: Payer: Self-pay

## 2022-10-31 DIAGNOSIS — K219 Gastro-esophageal reflux disease without esophagitis: Secondary | ICD-10-CM

## 2022-10-31 MED ORDER — RABEPRAZOLE SODIUM 20 MG PO TBEC: 20.0000 mg | DELAYED_RELEASE_TABLET | Freq: Every day | ORAL | 3 refills | Status: DC

## 2022-10-31 NOTE — Telephone Encounter (Signed)
MRI liver looks good-she appears to have cavernous hemangioma rather than HCC.  She does have stable underlying liver cirrhosis.  Radiology does recommend repeating MRI in 6 months with contrast.  For reflux-lets start Aciphex 20 mg p.o. every morning.  She can stop Protonix.  Continue Pepcid 20 mg p.o. nightly. Let us know how she is in 4 weeks. RG

## 2022-10-31 NOTE — Telephone Encounter (Signed)
Pt made aware of recent results and Dr. Lyndel Safe recommendations: Prescription sent to pharmacy: Pt made aware: Pt verbalized understanding with all questions answered.

## 2022-11-01 ENCOUNTER — Ambulatory Visit (INDEPENDENT_AMBULATORY_CARE_PROVIDER_SITE_OTHER): Payer: BC Managed Care – PPO | Admitting: Psychology

## 2022-11-01 DIAGNOSIS — F419 Anxiety disorder, unspecified: Secondary | ICD-10-CM

## 2022-11-01 DIAGNOSIS — F5081 Binge eating disorder: Secondary | ICD-10-CM | POA: Diagnosis not present

## 2022-11-01 DIAGNOSIS — F32A Depression, unspecified: Secondary | ICD-10-CM

## 2022-11-01 MED ORDER — LAMOTRIGINE 100 MG PO TABS: ORAL_TABLET | ORAL | 2 refills | Status: DC

## 2022-11-01 NOTE — Telephone Encounter (Signed)
Pt called requesting RF Lamotrigine. She's out. [facility]  [address] [place] [facility].

## 2022-11-01 NOTE — Progress Notes (Signed)
[place] Counselor/Therapist Progress Note  Patient ID: ABERDEEN HAFEN, MRN: [id number],    Date: 11/01/2022  Time Spent: 60 minutes  Treatment Type: Individual Therapy  Reported Symptoms: depression  Mental Status Exam: Appearance:  Casual     Behavior: Appropriate  Motor: Normal  Speech/Language:  Normal Rate  Affect: Blunt  Mood: pleasant  Thought process: normal  Thought content:   WNL  Sensory/Perceptual disturbances:   WNL  Orientation: oriented to person, place, time/date, and situation  Attention: Good  Concentration: Good  Memory: WNL  Fund of knowledge:  Good  Insight:   Good  Judgment:  Good  Impulse Control: Good   Risk Assessment: Danger to Self:  No Self-injurious Behavior: No Danger to Others: No Duty to Warn:no Physical Aggression / Violence:No  Access to Firearms a concern: No  Gang Involvement:No   Subjective: The patient attended a face-to-face individual therapy session via video visit today.  The patient gave verbal consent for the session to be on video on WebEx.  The patient was in her home alone and the therapist was in the office.  The patient presents with a blunted affect and her mood pleasant.  The patient states that she feels like she has been doing okay emotionally.  She was in bed today because she was having some back pain.  The patient was able to do a better job of reframing things for herself.  She did report that her medication provider put her on Lamictal which seems to be helping some.  The patient was much in a much better space than she was the last time I spoke with her and she did not seem to be spiraling in a negative way.  We used problem solving to help her think about some things she could do to help herself with weight loss and also some of her physical ailments.  Interventions: Cognitive Behavioral Therapy  Diagnosis:Depression, unspecified depression type  Binge eating disorder  Anxiety disorder,  unspecified type  Plan: Client Abilities/Strengths  Intelligent, motivated, insightful  Client Treatment Preferences  Outpatient Individual therapy  Client Statement of Needs  "I need some help with my depression"  Treatment Level  Outpatient Individual therapy  Symptoms  Depressed or irritable mood.:  (Status: Improved). Feelings of hopelessness,  worthlessness, or inappropriate guilt.: (Status: maintained). History of chronic  or recurrent depression for which the client has taken antidepressant medication, been hospitalized, had outpatient treatment, or had a course of electroconvulsive therapy.: (Status:  maintained). Lack of energy.:  (Status: maintained). Low self-esteem.:  (Status: maintained). Poor concentration and indecisiveness.: (Status: maintained). Sleeplessness or hypersomnia.: (Status:  maintained). Social withdrawal.: (Status: maintained).  Problems Addressed  Unipolar Depression, Unipolar Depression    Goals 1. Develop healthy interpersonal relationships that lead to the alleviation  and help prevent the relapse of depression. 2. Develop healthy thinking patterns and beliefs about self, others, and the world that lead to the alleviation and help prevent the relapse of  depression. Objective Verbalize an understanding of healthy and unhealthy emotions with the intent of increasing the use of  healthy emotions to guide actions. Target Date: 2023-05-02 Frequency: Weekly Progress: 40Modality: individual Objective Learn and implement behavioral strategies to overcome depression. Target Date: 2023-05-02 Frequency: Weekly Progress: 40 Modality: individual Related Interventions 1. Assist the client in developing skills that increase the likelihood of deriving pleasure from  behavioral activation (e.g., assertiveness skills, developing an exercise plan, less internal/more  external focus, increased social involvement); reinforce success.  Objective Describe current  and past experiences with depression including their impact on functioning and  attempts to resolve it. Target Date: 2023-05-02 Frequency: Weekly Progress: 40 Modality: individual Related Interventions 1. Encourage the client to share his/her thoughts and feelings of depression; express empathy and  build rapport while identifying primary cognitive, behavioral, interpersonal, or other  contributors to depression.  Objective Learn and implement problem-solving and decision-making skills. Target Date: 2023-05-02 Frequency: Weekly Progress: 40 Modality: individual Related Interventions 1. Encourage in the client the development of a positive problem orientation in which problems  and solving them are viewed as a natural part of life and not something to be feared, despaired,  or avoided. Objective Identify and replace thoughts and beliefs that support depression. Target Date: 2023-05-02 Frequency: Weekly Progress: 10 Modality: individual Related Interventions 1. Conduct Cognitive-Behavioral Therapy (see Cognitive Behavior Therapy by Olevia Bowens; Overcoming Depression by Lynita Lombard al.), beginning with helping the client learn the connection among  cognition, depressive feelings, and actions. 2. Facilitate and reinforce the client's shift from biased depressive self-talk and beliefs to realitybased cognitive messages that enhance self-confidence and increase adaptive actions (see  "Positive Self-Talk" in the Adult Psychotherapy Homework Planner by Bryn Gulling). Diagnosis Axis  none 296.32 (Major depressive affective disorder, recurrent episode, moderate) - Open -  [Signifier: n/a]  Conditions For Discharge Achievement of treatment goals and objectives Will continue to see the patient at least biweekly and work with her using CBT, Insight oriented approach and EMDR.  Patient approved the treatment plan.  Jayla Mackie G Kearston Putman,  LCSW

## 2022-11-01 NOTE — Telephone Encounter (Signed)
Contacted pt to ask if she has been without Lamictal. She reports that she understood from her pharmacy that she was to take 25 mg for 2 weeks, then 1.5 tabs daily for 2 weeks. She reports that pharmacy is telling her that office denied refill request. Pt reports that she has been taking Lamictal 25 mg 1.5 tablets and will run out of tabs in the next 1-2 days and has not missed any doses. She reports that she is tolerating it without difficulty, no rash, and has noticed some improvement in mood symptoms. Will send script for 100 mg 1/2 tab daily for 2 weeks, then 1 tab daily for 2 weeks, then 1.5 tablets daily. Pt verbalized understanding.

## 2022-11-05 ENCOUNTER — Telehealth: Payer: Self-pay | Admitting: Pharmacy Technician

## 2022-11-05 NOTE — Telephone Encounter (Signed)
Patient Advocate Encounter  Received notification from [facility] that prior authorization for REBEPRAZOLE '20MG'$  is required.   PA submitted on 12.26.23 Key [id number]  Status is pending

## 2022-11-07 ENCOUNTER — Telehealth: Payer: Self-pay | Admitting: Psychiatry

## 2022-11-07 NOTE — Telephone Encounter (Signed)
Notified patient of info provided.

## 2022-11-07 NOTE — Telephone Encounter (Signed)
Patient calling with question about 2 medications listed below. She said they are for weight loss, but also are supposed to have some mental health benefit. She is asking if it is okay to take them, as long as she continues all of prescribed medications.   Slenderiix  Ingredients: Ammonium Bromatum  12X, Avena Sativa 6X, Calcarea  Carbonica 9X, Fucus vesiculosus  6X, Graphites 12X, Ignatia Amara  9X, Kali Phosphoricum 12X,  Lycopodium Clavatum 6X, Natrum  Mur 12X, Sulfuricum Acidum 12X,  Thyroidinum 9X Inactive Ingredients: Grain Alcohol  20%, Natural Flavors, Stevia Leaf, USP  [id number]  Supplement Facts Serving Size: 24 drops (1 ml) Servings Per Container: 59   Amount Per Serving %DV Vitamin B12 600 mcg 10,000% (as Methylcobalamin) Biotin 100 mcg 33% Rosehips Fruit Extract 10 mg * Acerola Fruit 2.5 mg * Proprietary Energy Blend 37.5 mg * Green Tea Leaf Extract, Fresh Ashwagandha Root Extract, Rhodiola Rosea Root Extract *Daily Value (DV) Not Established Other Ingredients: Vegetable Glycerin,  Water, Stevia Leaf Extract, Peppermint Leaf Oil

## 2022-11-07 NOTE — Telephone Encounter (Signed)
Please let her know that neither of these supplements are regulated by the FDA or FDA approved, so it is unknown if they would be safe to take with her current medications. The Slenderiix has several ingredients that I am not familiar with. The Alain Honey seems to be more plant-based and contain more familiar ingredients, although it is not clear what the "Proprietary Energy Blend" contains. Some energy blends and high amounts of green tea can be stimulating and increase anxiety for some people. Also, the high amount of Vitamin B12 may cause insomnia if taken at night. Since both supplements contain multiple ingredients, if she has an adverse reaction it may be difficult for her to determine the cause.

## 2022-11-07 NOTE — Telephone Encounter (Signed)
Patient Advocate Encounter  Prior Authorization for RABEPRAZOLE '20MG'$  has been approved.    PA# --- Effective dates: 12.26.23 through 12.24.24

## 2022-11-07 NOTE — Telephone Encounter (Signed)
Pt called asking for RTC. Having med issues. Would not give details. Call pt [phone]

## 2022-11-11 ENCOUNTER — Other Ambulatory Visit (INDEPENDENT_AMBULATORY_CARE_PROVIDER_SITE_OTHER): Payer: Self-pay | Admitting: Family Medicine

## 2022-11-11 DIAGNOSIS — R7303 Prediabetes: Secondary | ICD-10-CM

## 2022-11-13 ENCOUNTER — Ambulatory Visit (INDEPENDENT_AMBULATORY_CARE_PROVIDER_SITE_OTHER): Payer: BC Managed Care – PPO | Admitting: Family Medicine

## 2022-11-13 DIAGNOSIS — K219 Gastro-esophageal reflux disease without esophagitis: Secondary | ICD-10-CM | POA: Diagnosis not present

## 2022-11-13 DIAGNOSIS — M150 Primary generalized (osteo)arthritis: Secondary | ICD-10-CM | POA: Diagnosis not present

## 2022-11-13 DIAGNOSIS — I10 Essential (primary) hypertension: Secondary | ICD-10-CM | POA: Diagnosis not present

## 2022-11-13 DIAGNOSIS — R5383 Other fatigue: Secondary | ICD-10-CM | POA: Diagnosis not present

## 2022-11-14 DIAGNOSIS — M6281 Muscle weakness (generalized): Secondary | ICD-10-CM | POA: Diagnosis not present

## 2022-11-14 DIAGNOSIS — M545 Low back pain, unspecified: Secondary | ICD-10-CM | POA: Diagnosis not present

## 2022-11-15 ENCOUNTER — Ambulatory Visit: Payer: BC Managed Care – PPO | Admitting: Psychology

## 2022-11-17 ENCOUNTER — Encounter: Payer: Self-pay | Admitting: Gastroenterology

## 2022-11-22 NOTE — Telephone Encounter (Signed)
error 

## 2022-11-24 NOTE — Telephone Encounter (Signed)
Lets go back on Protonix 40 mg p.o. twice daily If still with problems, take Pepcid 20 mg p.o. nightly Regarding liver clinic-please see them once a year. Plan is as per last note to gradually reduce weight. RG

## 2022-11-25 ENCOUNTER — Other Ambulatory Visit: Payer: Self-pay

## 2022-11-25 DIAGNOSIS — K219 Gastro-esophageal reflux disease without esophagitis: Secondary | ICD-10-CM

## 2022-11-25 MED ORDER — PANTOPRAZOLE SODIUM 40 MG PO TBEC: 40.0000 mg | DELAYED_RELEASE_TABLET | Freq: Two times a day (BID) | ORAL | 3 refills | Status: DC

## 2022-11-28 NOTE — Telephone Encounter (Signed)
Add Carafate elixir 1 g p.o. 4 times daily x 2 weeks, 2 refills RG

## 2022-11-29 ENCOUNTER — Other Ambulatory Visit: Payer: Self-pay | Admitting: Physician Assistant

## 2022-11-29 ENCOUNTER — Ambulatory Visit (INDEPENDENT_AMBULATORY_CARE_PROVIDER_SITE_OTHER): Payer: BC Managed Care – PPO | Admitting: Psychology

## 2022-11-29 DIAGNOSIS — F419 Anxiety disorder, unspecified: Secondary | ICD-10-CM | POA: Diagnosis not present

## 2022-11-29 DIAGNOSIS — F32A Depression, unspecified: Secondary | ICD-10-CM

## 2022-11-29 DIAGNOSIS — F5081 Binge eating disorder: Secondary | ICD-10-CM

## 2022-11-29 MED ORDER — SUCRALFATE 1 G PO TABS: 1.0000 g | ORAL_TABLET | Freq: Three times a day (TID) | ORAL | 2 refills | Status: DC

## 2022-11-29 NOTE — Progress Notes (Signed)
[facility] Counselor/Therapist Progress Note  Patient ID: Jacqueline Fowler, MRN: [id number],    Date: 11/29/2022  Time Spent: 60 minutes  Treatment Type: Individual Therapy  Reported Symptoms: depression  Mental Status Exam: Appearance:  Casual     Behavior: Appropriate  Motor: Normal  Speech/Language:  Normal Rate  Affect: Blunt  Mood: frustrated  Thought process: normal  Thought content:   WNL  Sensory/Perceptual disturbances:   WNL  Orientation: oriented to person, place, time/date, and situation  Attention: Good  Concentration: Good  Memory: WNL  Fund of knowledge:  Good  Insight:   Good  Judgment:  Good  Impulse Control: Good   Risk Assessment: Danger to Self:  No Self-injurious Behavior: No Danger to Others: No Duty to Warn:no Physical Aggression / Violence:No  Access to Firearms a concern: No  Gang Involvement:No   Subjective: The patient attended a face-to-face individual therapy session via video visit today.  The patient gave verbal consent for the session to be on video on WebEx.  The patient was in her home alone and the therapist was in the office.  The patient presents with a blunted affect and her mood is frustrated.  The patient is very frustrated by her health issues.  We used problem solving to talk about some potential ways to handle the situation with her reflux and health issues.  We talked about her trying to change some of her food choices so that it might help her lose some weight so that might impact her health.  We also talked about her doing more meditation and mindfulness to try to keep herself more level as she tends to start her day on email and her calendar.  The patient felt that she would work on trying to make different choices around her meals and implement more meditation and mindfulness over the next month. Interventions: Cognitive Behavioral Therapy  Diagnosis:Depression, unspecified depression type  Binge eating  disorder  Anxiety disorder, unspecified type  Plan: Client Abilities/Strengths  Intelligent, motivated, insightful  Client Treatment Preferences  Outpatient Individual therapy  Client Statement of Needs  "I need some help with my depression"  Treatment Level  Outpatient Individual therapy  Symptoms  Depressed or irritable mood.:  (Status: Improved). Feelings of hopelessness,  worthlessness, or inappropriate guilt.: (Status: maintained). History of chronic  or recurrent depression for which the client has taken antidepressant medication, been hospitalized, had outpatient treatment, or had a course of electroconvulsive therapy.: (Status:  maintained). Lack of energy.:  (Status: maintained). Low self-esteem.:  (Status: maintained). Poor concentration and indecisiveness.: (Status: maintained). Sleeplessness or hypersomnia.: (Status:  maintained). Social withdrawal.: (Status: maintained).  Problems Addressed  Unipolar Depression, Unipolar Depression    Goals 1. Develop healthy interpersonal relationships that lead to the alleviation  and help prevent the relapse of depression. 2. Develop healthy thinking patterns and beliefs about self, others, and the world that lead to the alleviation and help prevent the relapse of  depression. Objective Verbalize an understanding of healthy and unhealthy emotions with the intent of increasing the use of  healthy emotions to guide actions. Target Date: 2023-05-02 Frequency: Weekly Progress: 40Modality: individual Objective Learn and implement behavioral strategies to overcome depression. Target Date: 2023-05-02 Frequency: Weekly Progress: 40 Modality: individual Related Interventions 1. Assist the client in developing skills that increase the likelihood of deriving pleasure from  behavioral activation (e.g., assertiveness skills, developing an exercise plan, less internal/more  external focus, increased social involvement); reinforce  success. Objective Describe  current and past experiences with depression including their impact on functioning and  attempts to resolve it. Target Date: 2023-05-02 Frequency: Weekly Progress: 40 Modality: individual Related Interventions 1. Encourage the client to share his/her thoughts and feelings of depression; express empathy and  build rapport while identifying primary cognitive, behavioral, interpersonal, or other  contributors to depression.  Objective Learn and implement problem-solving and decision-making skills. Target Date: 2023-05-02 Frequency: Weekly Progress: 40 Modality: individual Related Interventions 1. Encourage in the client the development of a positive problem orientation in which problems  and solving them are viewed as a natural part of life and not something to be feared, despaired,  or avoided. Objective Identify and replace thoughts and beliefs that support depression. Target Date: 2023-05-02 Frequency: Weekly Progress: 10 Modality: individual Related Interventions 1. Conduct Cognitive-Behavioral Therapy (see Cognitive Behavior Therapy by Olevia Bowens; Overcoming Depression by Lynita Lombard al.), beginning with helping the client learn the connection among  cognition, depressive feelings, and actions. 2. Facilitate and reinforce the client's shift from biased depressive self-talk and beliefs to realitybased cognitive messages that enhance self-confidence and increase adaptive actions (see  "Positive Self-Talk" in the Adult Psychotherapy Homework Planner by Bryn Gulling). Diagnosis Axis  none 296.32 (Major depressive affective disorder, recurrent episode, moderate) - Open -  [Signifier: n/a]  Conditions For Discharge Achievement of treatment goals and objectives Will continue to see the patient at least biweekly and work with her using CBT, Insight oriented approach and EMDR.  Patient approved the treatment plan.  Alisandra Son G Oliva Montecalvo,  LCSW

## 2022-11-29 NOTE — Telephone Encounter (Signed)
Carafate sent to pharmacy. Told patient to take her additional medication and to call in a week with an update. She still having her episodes of vomiting

## 2022-11-29 NOTE — Telephone Encounter (Signed)
Spoke with Pt in regard to previous message: Pt notified that her episode of vomiting should resolve after starting her medications;  Pt stated that she has been having foul smelling urine that is darker that normal the last several months: Pt was notified that there are multiple things that can cause her Urine to be dark such as dehydration, UTI, Diabetes.  Pt stated that she is pretty sure that it is not none of them, Pt is questioned if this is a side affect from her Liver. Please advise

## 2022-12-04 NOTE — Telephone Encounter (Signed)
Her liver function tests including total bilirubin was normal in December. I have reviewed her previous MRI as well. I doubt if it is due to liver  Still, if she is still having dark urine, can check CBC, CMP, UA RG

## 2022-12-05 NOTE — Telephone Encounter (Signed)
Pt made aware of Dr. Lyndel Safe recommendations. Pt stated that her urine is starting to get better and not as bad as it was. Pt stated that she would like to hold off on the orders for the labs for now.  Pt stated that she is still having issues with Reflux to the point that the Vomited last night in her bed in the middle of the night.  Pt stated that she is taking  Carafate 4 times daily Protonix 40 mg twice daily Pepcid 20 mg at night. Pt stated that sometimes she feels like there is something in her throat when she lays down but when she stands up the sensation goes away:  Please advise

## 2022-12-05 NOTE — Telephone Encounter (Signed)
Lets get UGI series. Also, please instruct radiology to give barium tablet as well for dysphagia RG

## 2022-12-06 ENCOUNTER — Other Ambulatory Visit: Payer: Self-pay

## 2022-12-06 DIAGNOSIS — K219 Gastro-esophageal reflux disease without esophagitis: Secondary | ICD-10-CM

## 2022-12-06 DIAGNOSIS — R1310 Dysphagia, unspecified: Secondary | ICD-10-CM

## 2022-12-06 NOTE — Telephone Encounter (Signed)
Pt made aware of Dr. Lyndel Safe recommendations: UGI ordered and scheduled for 12/13/2022 at 11:00 AM at [facility]. Pt to arrive at 10:30 AM . Nothing to eat or drink past midnight. Pt made aware:  Pt verbalized understanding with all questions answered.

## 2022-12-07 DIAGNOSIS — D485 Neoplasm of uncertain behavior of skin: Secondary | ICD-10-CM | POA: Diagnosis not present

## 2022-12-12 DIAGNOSIS — M5451 Vertebrogenic low back pain: Secondary | ICD-10-CM | POA: Diagnosis not present

## 2022-12-12 DIAGNOSIS — M5416 Radiculopathy, lumbar region: Secondary | ICD-10-CM | POA: Diagnosis not present

## 2022-12-12 DIAGNOSIS — M47816 Spondylosis without myelopathy or radiculopathy, lumbar region: Secondary | ICD-10-CM | POA: Diagnosis not present

## 2022-12-12 DIAGNOSIS — Z79899 Other long term (current) drug therapy: Secondary | ICD-10-CM | POA: Diagnosis not present

## 2022-12-12 DIAGNOSIS — Z5181 Encounter for therapeutic drug level monitoring: Secondary | ICD-10-CM | POA: Diagnosis not present

## 2022-12-12 DIAGNOSIS — M7542 Impingement syndrome of left shoulder: Secondary | ICD-10-CM | POA: Diagnosis not present

## 2022-12-13 ENCOUNTER — Other Ambulatory Visit: Payer: Self-pay | Admitting: Gastroenterology

## 2022-12-13 ENCOUNTER — Ambulatory Visit (HOSPITAL_COMMUNITY)
Admission: RE | Admit: 2022-12-13 | Discharge: 2022-12-13 | Disposition: A | Discharge status: Home / Self Care | Payer: BC Managed Care – PPO | Source: Ambulatory Visit | Attending: Gastroenterology | Admitting: Gastroenterology

## 2022-12-13 DIAGNOSIS — K6389 Other specified diseases of intestine: Secondary | ICD-10-CM | POA: Diagnosis not present

## 2022-12-13 DIAGNOSIS — K219 Gastro-esophageal reflux disease without esophagitis: Secondary | ICD-10-CM | POA: Diagnosis not present

## 2022-12-13 DIAGNOSIS — R1310 Dysphagia, unspecified: Secondary | ICD-10-CM | POA: Diagnosis not present

## 2022-12-17 ENCOUNTER — Telehealth: Payer: Self-pay | Admitting: Gastroenterology

## 2022-12-17 NOTE — Telephone Encounter (Signed)
Spoke with pt:  Documented in result notes 

## 2022-12-17 NOTE — Telephone Encounter (Signed)
Patient is calling states she would like to receive a phone call to discuss test results. Please advise

## 2022-12-19 ENCOUNTER — Telehealth: Payer: Self-pay | Admitting: Gastroenterology

## 2022-12-19 NOTE — Telephone Encounter (Signed)
Inbound call from pt , requesting to speak with you I'm regards her acid reflux. Please advise thanks

## 2022-12-19 NOTE — Telephone Encounter (Signed)
Spoke with Pt. Pt stated that she started to have some increased reflux.  Chart reviewed and noted that she felt a lot after completing a prescription of the Carafate. Pt noted to have an additional refill, Pt notified. My Chart message sent with foods to eat for pts with GERD.  Pt verbalized understanding with all questions answered.

## 2022-12-20 ENCOUNTER — Other Ambulatory Visit: Payer: Self-pay | Admitting: Psychiatry

## 2022-12-20 DIAGNOSIS — F419 Anxiety disorder, unspecified: Secondary | ICD-10-CM

## 2022-12-26 ENCOUNTER — Ambulatory Visit: Payer: BC Managed Care – PPO | Admitting: Psychiatry

## 2022-12-26 ENCOUNTER — Encounter: Payer: Self-pay | Admitting: Psychiatry

## 2022-12-26 DIAGNOSIS — F419 Anxiety disorder, unspecified: Secondary | ICD-10-CM

## 2022-12-26 DIAGNOSIS — G4700 Insomnia, unspecified: Secondary | ICD-10-CM | POA: Diagnosis not present

## 2022-12-26 DIAGNOSIS — F32A Depression, unspecified: Secondary | ICD-10-CM

## 2022-12-26 MED ORDER — BUSPIRONE HCL 30 MG PO TABS: 30.0000 mg | ORAL_TABLET | Freq: Two times a day (BID) | ORAL | 1 refills | Status: DC

## 2022-12-26 MED ORDER — LORAZEPAM 0.5 MG PO TABS: 0.5000 mg | ORAL_TABLET | Freq: Every day | ORAL | 3 refills | Status: DC | PRN

## 2022-12-26 MED ORDER — LAMOTRIGINE 100 MG PO TABS: 150.0000 mg | ORAL_TABLET | Freq: Every day | ORAL | 1 refills | Status: DC

## 2022-12-26 MED ORDER — SERTRALINE HCL 100 MG PO TABS: 100.0000 mg | ORAL_TABLET | Freq: Two times a day (BID) | ORAL | 1 refills | Status: DC

## 2022-12-26 MED ORDER — BREXPIPRAZOLE 2 MG PO TABS: 2.0000 mg | ORAL_TABLET | Freq: Every day | ORAL | 1 refills | Status: DC

## 2022-12-26 MED ORDER — PROPRANOLOL HCL 10 MG PO TABS: ORAL_TABLET | ORAL | 1 refills | Status: DC

## 2022-12-26 NOTE — Progress Notes (Signed)
Jacqueline Fowler [id number] 18-Jan-1971 52 y.o.  Subjective:   Patient ID:  Jacqueline Fowler is a 52 y.o. (DOB 1971/07/06) female.  Chief Complaint:  Chief Complaint  Patient presents with   Follow-up    Depression and anxiety    HPI [name] presents to the office today for follow-up of depression and anxiety. She reports some stress with health issues. She reports some anxiety at times "where insides are out of whack" and will Lorazepam prn at those times. She reports that her mood has improved some with Lamictal. She reports that she now will "kid around" again and denies persistent depression. Enjoying time with family. She reports starting Ozempic 6-7 weeks ago. She reports that she has noticed a decrease in appetite and denies any weight loss. She has started doing Film/video editor. She reports some anxious thoughts and rumination. She reports low energy and motivation- "I hardly go anywhere." She reports, "I don't do anything productive." She reports, "I feel safe in my little space." She reports that she is no longer sleeping excessively. Concentration is fair. Denies SI.   Husband has not worked for 3 years and is looking for work. She reports that this has caused some frustration and anxiety. Husband drives her to appointments.   Youngest son is living with them, attending community college, and working part-time. He is applying for transfer into a university. She reports "mixed feelings" about son moving out to go to school in the fall. He has a girlfriend. She does not have contact with oldest son.   She will play games on her phone. She does not watch TV.   Past Psychiatric Medication Trials: Valium Sertraline Prozac Wellbutrin-adverse reaction Rexulti Abilify- Weight gain Trazodone Buspar Propranolol Ativan  AIMS    Flowsheet Row Office Visit from 12/26/2022 in [facility] Office Visit from 11/28/2021 in [facility] Office Visit from 07/31/2021 in [facility] Office Visit from 03/29/2021 in [facility] Total Score 0 0 0 0      PHQ2-9    [facility] Office Visit from 08/14/2022 in [facility] Total Score 6  PHQ-9 Total Score 25        Review of Systems:  Review of Systems  Gastrointestinal:        Acid reflux with n/v  Musculoskeletal:  Positive for arthralgias and back pain. Negative for gait problem.  Skin:  Negative for rash.  Neurological:  Negative for tremors.  Psychiatric/Behavioral:         Please refer to HPI    Medications: I have reviewed the patient's current medications.  Current Outpatient Medications  Medication Sig Dispense Refill   acetaminophen (TYLENOL) 500 MG tablet Take 500 mg by mouth as needed.     Cholecalciferol (VITAMIN D3 PO) Take by mouth.     cyclobenzaprine (FLEXERIL) 5 MG tablet Take 5 mg by mouth 3 (three) times daily.     dicyclomine (BENTYL) 10 MG capsule TAKE 1 CAPSULE BY MOUTH 2 TO 3 TIMES A DAY 270 capsule 1   famotidine (PEPCID) 20 MG tablet TAKE 1 TABLET BY MOUTH EVERYDAY AT BEDTIME 90 tablet 4   hyoscyamine (LEVBID) 0.375 MG 12 hr tablet TAKE 1 TABLET (0.375 MG TOTAL) BY MOUTH 2 (TWO) TIMES DAILY. 180 tablet 2   Multiple Vitamins-Minerals (CENTRUM SILVER 50+WOMEN PO) Take by mouth.     ondansetron (  ZOFRAN ODT) 4 MG disintegrating tablet Take 1 tablet (4 mg total) by mouth every 8 (eight) hours as needed for nausea or vomiting. 25 tablet 0   oxyCODONE-acetaminophen (PERCOCET/ROXICET) 5-325 MG tablet Take 1 tablet by mouth every 6 (six) hours as needed.     pantoprazole (PROTONIX) 40 MG tablet Take 1 tablet (40 mg total) by mouth 2 (two) times daily. 90 tablet 3   Pyridoxine HCl (VITAMIN B-6 PO) Take by mouth.     Semaglutide,0.25 or 0.5MG/DOS, (OZEMPIC, 0.25 OR 0.5 MG/DOSE,) 2 MG/3ML SOPN as directed Subcutaneous once weekly for 28 days      sucralfate (CARAFATE) 1 g tablet Take 1 tablet (1 g total) by mouth 4 (four) times daily -  with meals and at bedtime. 56 tablet 2   brexpiprazole (REXULTI) 2 MG TABS tablet Take 1 tablet (2 mg total) by mouth daily. 90 tablet 1   busPIRone (BUSPAR) 30 MG tablet Take 1 tablet (30 mg total) by mouth 2 (two) times daily. 180 tablet 1   lamoTRIgine (LAMICTAL) 100 MG tablet Take 1.5 tablets (150 mg total) by mouth daily. 90 tablet 1   loperamide (IMODIUM) 2 MG capsule Take by mouth as needed for diarrhea or loose stools. (Patient not taking: Reported on 12/26/2022)     [START ON 01/17/2023] LORazepam (ATIVAN) 0.5 MG tablet Take 1 tablet (0.5 mg total) by mouth daily as needed for anxiety (panic attack). 15 tablet 3   metFORMIN (GLUCOPHAGE) 500 MG tablet Take 1 tablet (500 mg total) by mouth 2 (two) times daily with a meal. (Patient not taking: Reported on 12/26/2022) 60 tablet 0   propranolol (INDERAL) 10 MG tablet TAKE 1 TO 2 TABLETS BY MOUTH TWICE A DAY AS NEEDED FOR ANXIETY 360 tablet 1   sertraline (ZOLOFT) 100 MG tablet Take 1 tablet (100 mg total) by mouth in the morning and at bedtime. 180 tablet 1   Current Facility-Administered Medications  Medication Dose Route Frequency Provider Last Rate Last Admin   0.9 %  sodium chloride infusion  500 mL Intravenous Once [name], MD        Medication Side Effects: None  Allergies: No Known Allergies  Past Medical History:  Diagnosis Date   Anxiety    B12 deficiency    Back pain    Chest pain    Cirrhosis of liver ([place])    Depression    Fatty liver    Gallbladder problem    Gallstones 2004   GERD (gastroesophageal reflux disease)    History of stomach ulcers    IBS (irritable bowel syndrome)    Joint pain    Obesity    Pre-diabetes    Swallowing difficulty    UTI (urinary tract infection)    Vitamin D deficiency     Past Medical History, Surgical history, Social history, and Family history were reviewed and updated as  appropriate.   Please see review of systems for further details on the patient's review from today.   Objective:   Physical Exam:  LMP 05/15/2021 (Approximate)   Physical Exam Constitutional:      General: She is not in acute distress. Musculoskeletal:        General: No deformity.  Neurological:     Mental Status: She is alert and oriented to person, place, and time.     Coordination: Coordination normal.  Psychiatric:        Attention and Perception: Attention and perception normal. She does not perceive auditory or visual  hallucinations.        Mood and Affect: Mood is anxious. Affect is not labile, blunt, angry or inappropriate.        Speech: Speech normal.        Behavior: Behavior normal.        Thought Content: Thought content normal. Thought content is not paranoid or delusional. Thought content does not include homicidal or suicidal ideation. Thought content does not include homicidal or suicidal plan.        Cognition and Memory: Cognition and memory normal.        Judgment: Judgment normal.     Comments: Insight intact Mood is mildly depressed     Lab Review:     Component Value Date/Time   NA 137 10/17/2022 1455   NA 138 08/14/2022 1100   K 4.1 10/17/2022 1455   CL 102 10/17/2022 1455   CO2 27 10/17/2022 1455   GLUCOSE 94 10/17/2022 1455   BUN 14 10/17/2022 1455   BUN 15 08/14/2022 1100   CREATININE 0.77 10/17/2022 1455   CALCIUM 9.7 10/17/2022 1455   PROT 7.4 10/17/2022 1455   PROT 7.1 08/14/2022 1100   ALBUMIN 4.4 10/17/2022 1455   ALBUMIN 4.6 08/14/2022 1100   AST 27 10/17/2022 1455   ALT 23 10/17/2022 1455   ALKPHOS 81 10/17/2022 1455   BILITOT 0.5 10/17/2022 1455   BILITOT 0.5 08/14/2022 1100       Component Value Date/Time   WBC 7.5 10/17/2022 1455   RBC 5.12 (H) 10/17/2022 1455   HGB 14.4 10/17/2022 1455   HGB 14.0 08/14/2022 1100   HCT 42.8 10/17/2022 1455   HCT 42.7 08/14/2022 1100   PLT 273.0 10/17/2022 1455   PLT 217 08/14/2022  1100   MCV 83.6 10/17/2022 1455   MCV 85 08/14/2022 1100   MCH 27.8 08/14/2022 1100   MCHC 33.6 10/17/2022 1455   RDW 14.5 10/17/2022 1455   RDW 15.1 08/14/2022 1100   LYMPHSABS 1.6 10/17/2022 1455   LYMPHSABS 1.4 08/14/2022 1100   MONOABS 0.5 10/17/2022 1455   EOSABS 0.0 10/17/2022 1455   EOSABS 0.1 08/14/2022 1100   BASOSABS 0.1 10/17/2022 1455   BASOSABS 0.0 08/14/2022 1100    No results found for: "POCLITH", "LITHIUM"   No results found for: "PHENYTOIN", "PHENOBARB", "VALPROATE", "CBMZ"   .res Assessment: Plan:    Pt seen for 30 minutes and time spent discussing response to Lamictal and treatment plan. She reports that Lamictal has been helpful for depression and well tolerated. Will continue with Lamictal 150 mg po qd for depression.  Continue Sertraline 100 mg po BID for anxiety and depression.  Continue Propranolol 10 mg 1-2 tabs po BID prn anxiety. Continue Rexulti 2 mg po qd for depression.  Continue Buspar 30 mg po BID for anxiety.  Continue Lorazepam 0.5 mg po qd prn anxiety.  Recommend continuing therapy with Bambi Cottle, LCSW.  Pt to follow-up in 4 months or sooner if clinically indicated.  Patient advised to contact office with any questions, adverse effects, or acute worsening in signs and symptoms.   Latesha was seen today for follow-up.  Diagnoses and all orders for this visit:  Anxiety disorder, unspecified type -     busPIRone (BUSPAR) 30 MG tablet; Take 1 tablet (30 mg total) by mouth 2 (two) times daily. -     LORazepam (ATIVAN) 0.5 MG tablet; Take 1 tablet (0.5 mg total) by mouth daily as needed for anxiety (panic attack). -  sertraline (ZOLOFT) 100 MG tablet; Take 1 tablet (100 mg total) by mouth in the morning and at bedtime. -     propranolol (INDERAL) 10 MG tablet; TAKE 1 TO 2 TABLETS BY MOUTH TWICE A DAY AS NEEDED FOR ANXIETY  Depression, unspecified depression type -     brexpiprazole (REXULTI) 2 MG TABS tablet; Take 1 tablet (2 mg total) by  mouth daily. -     lamoTRIgine (LAMICTAL) 100 MG tablet; Take 1.5 tablets (150 mg total) by mouth daily. -     sertraline (ZOLOFT) 100 MG tablet; Take 1 tablet (100 mg total) by mouth in the morning and at bedtime.  Insomnia, unspecified type     Please see After Visit Summary for patient specific instructions.  Future Appointments  Date Time Provider [place]  12/27/2022 10:00 AM Cottle, Lucious Groves, LCSW [facility] None  01/24/2023 10:00 AM Cottle, Bambi G, LCSW [facility] None  02/21/2023 10:00 AM Cottle, Bambi G, LCSW [facility] None  04/21/2023 11:00 AM Thayer Headings, PMHNP [facility] None    No orders of the defined types were placed in this encounter.   -------------------------------

## 2022-12-27 ENCOUNTER — Ambulatory Visit (INDEPENDENT_AMBULATORY_CARE_PROVIDER_SITE_OTHER): Payer: BC Managed Care – PPO | Admitting: Psychology

## 2022-12-27 DIAGNOSIS — F419 Anxiety disorder, unspecified: Secondary | ICD-10-CM | POA: Diagnosis not present

## 2022-12-27 DIAGNOSIS — F32A Depression, unspecified: Secondary | ICD-10-CM | POA: Diagnosis not present

## 2022-12-27 DIAGNOSIS — F5081 Binge eating disorder: Secondary | ICD-10-CM | POA: Diagnosis not present

## 2022-12-27 NOTE — Progress Notes (Signed)
[facility] Counselor/Therapist Progress Note  Patient ID: Jacqueline Fowler, MRN: [id number],    Date: 12/27/2022  Time Spent: 60 minutes  Treatment Type: Individual Therapy  Reported Symptoms: depression  Mental Status Exam: Appearance:  Casual     Behavior: Appropriate  Motor: Normal  Speech/Language:  Normal Rate  Affect: Blunt  Mood: depressed  Thought process: normal  Thought content:   WNL  Sensory/Perceptual disturbances:   WNL  Orientation: oriented to person, place, time/date, and situation  Attention: Good  Concentration: Good  Memory: WNL  Fund of knowledge:  Good  Insight:   Good  Judgment:  Good  Impulse Control: Good   Risk Assessment: Danger to Self:  No Self-injurious Behavior: No Danger to Others: No Duty to Warn:no Physical Aggression / Violence:No  Access to Firearms a concern: No  Gang Involvement:No   Subjective: The patient attended a face-to-face individual therapy session via video visit today.  The patient gave verbal consent for the session to be on video on WebEx.  The patient was in her home alone and the therapist was in the office.  The patient presents with a blunted affect and mood is depressed.  The patient states that she has a few things that she wants to talk about today.  She reports that she has not been feeling well and hopefully she is going to work on taking care of herself so that she can be healthier.  One of the things she wanted to talk about today was her relationship with this friend of hers.  The second thing she wanted to talk about was a situation with her husband and the third thing was her mother.  We talked about all 3 of these relationships and part of the issue is that she often times has the expectation that they will respond like she wants them to respond as she would and this disappoints her.  I explained to her that she has to get her expectations equal with reality in regard to all of her relationships.   In addition we talked about how to change some of the cognitions in her head so that she does not internalize them so much.  We talked about the biggest thing that she needs to work on is not having unrealistic expectations and then not absorbing other people's responsibilities or feelings.  Interventions: Cognitive Behavioral Therapy  Diagnosis:Anxiety disorder, unspecified type  Depression, unspecified depression type  Binge eating disorder  Plan: Client Abilities/Strengths  Intelligent, motivated, insightful  Client Treatment Preferences  Outpatient Individual therapy  Client Statement of Needs  "I need some help with my depression"  Treatment Level  Outpatient Individual therapy  Symptoms  Depressed or irritable mood.:  (Status: Improved). Feelings of hopelessness,  worthlessness, or inappropriate guilt.: (Status: maintained). History of chronic  or recurrent depression for which the client has taken antidepressant medication, been hospitalized, had outpatient treatment, or had a course of electroconvulsive therapy.: (Status:  maintained). Lack of energy.:  (Status: maintained). Low self-esteem.:  (Status: maintained). Poor concentration and indecisiveness.: (Status: maintained). Sleeplessness or hypersomnia.: (Status:  maintained). Social withdrawal.: (Status: maintained).  Problems Addressed  Unipolar Depression, Unipolar Depression    Goals 1. Develop healthy interpersonal relationships that lead to the alleviation  and help prevent the relapse of depression. 2. Develop healthy thinking patterns and beliefs about self, others, and the world that lead to the alleviation and help prevent the relapse of  depression. Objective Verbalize an understanding of healthy and  unhealthy emotions with the intent of increasing the use of  healthy emotions to guide actions. Target Date: 2023-05-02 Frequency: Weekly Progress: 40Modality: individual Objective Learn and implement  behavioral strategies to overcome depression. Target Date: 2023-05-02 Frequency: Weekly Progress: 40 Modality: individual Related Interventions 1. Assist the client in developing skills that increase the likelihood of deriving pleasure from  behavioral activation (e.g., assertiveness skills, developing an exercise plan, less internal/more  external focus, increased social involvement); reinforce success. Objective Describe current and past experiences with depression including their impact on functioning and  attempts to resolve it. Target Date: 2023-05-02 Frequency: Weekly Progress: 40 Modality: individual Related Interventions 1. Encourage the client to share his/her thoughts and feelings of depression; express empathy and  build rapport while identifying primary cognitive, behavioral, interpersonal, or other  contributors to depression.  Objective Learn and implement problem-solving and decision-making skills. Target Date: 2023-05-02 Frequency: Weekly Progress: 40 Modality: individual Related Interventions 1. Encourage in the client the development of a positive problem orientation in which problems  and solving them are viewed as a natural part of life and not something to be feared, despaired,  or avoided. Objective Identify and replace thoughts and beliefs that support depression. Target Date: 2023-05-02 Frequency: Weekly Progress: 10 Modality: individual Related Interventions 1. Conduct Cognitive-Behavioral Therapy (see Cognitive Behavior Therapy by Olevia Bowens; Overcoming Depression by Lynita Lombard al.), beginning with helping the client learn the connection among  cognition, depressive feelings, and actions. 2. Facilitate and reinforce the client's shift from biased depressive self-talk and beliefs to realitybased cognitive messages that enhance self-confidence and increase adaptive actions (see  "Positive Self-Talk" in the Adult Psychotherapy Homework Planner by  Bryn Gulling). Diagnosis Axis  none 296.32 (Major depressive affective disorder, recurrent episode, moderate) - Open -  [Signifier: n/a]  Conditions For Discharge Achievement of treatment goals and objectives Will continue to see the patient at least biweekly and work with her using CBT, Insight oriented approach and EMDR.  Patient approved the treatment plan.  Alassane Kalafut G Brice Potteiger, LCSW

## 2023-01-01 IMAGING — CT CT ABD-PELV W/ CM
2 of 11 series · 11 of 46 positions shown, 17 images · IV contrast (OMNIPAQUE 300)
Comparison: CT April 25, 2021

CLINICAL DATA: Hepatic cirrhosis.

EXAM:
CT ABDOMEN AND PELVIS WITH CONTRAST
TECHNIQUE: Multidetector CT imaging of the abdomen and pelvis was performed
using the standard protocol following bolus administration of
intravenous contrast.

[Series 5: portal venous 3.0 br38 · axial · portal-venous · 0.79mm/px · z∈[-466,-82]mm · 9 of 160 slices shown, 15 images]
[im 16/160  soft-tissue]
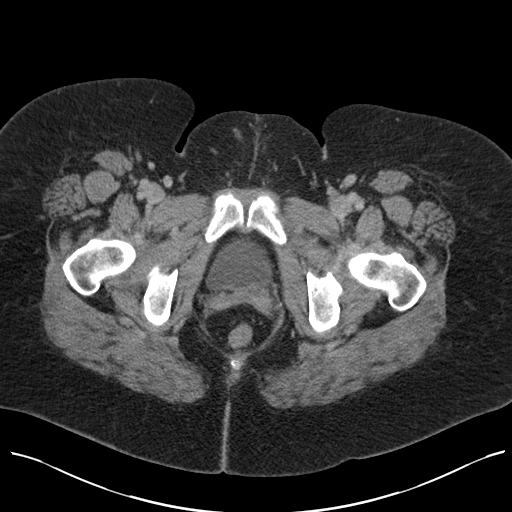
[im 16/160  bone]
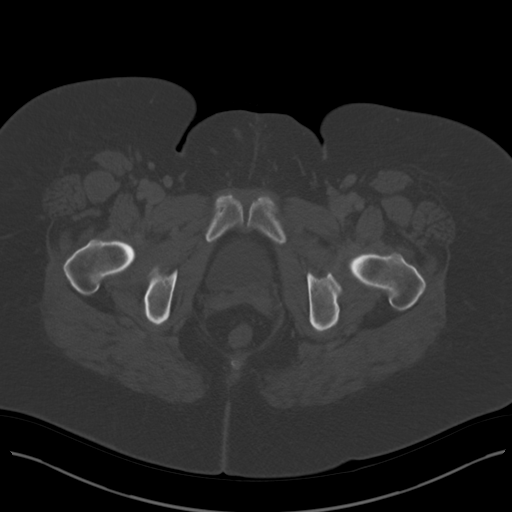
[im 32/160  soft-tissue]
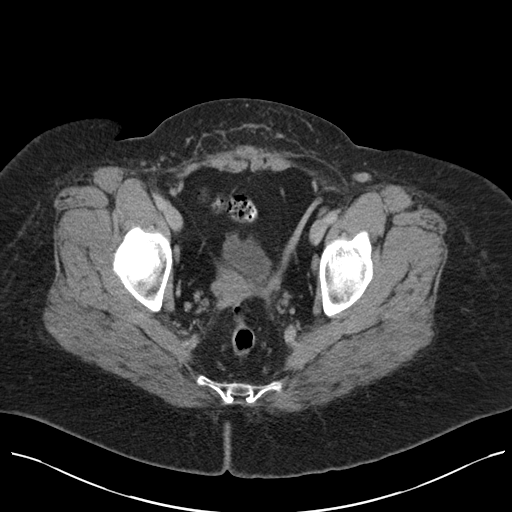
[im 48/160  soft-tissue]
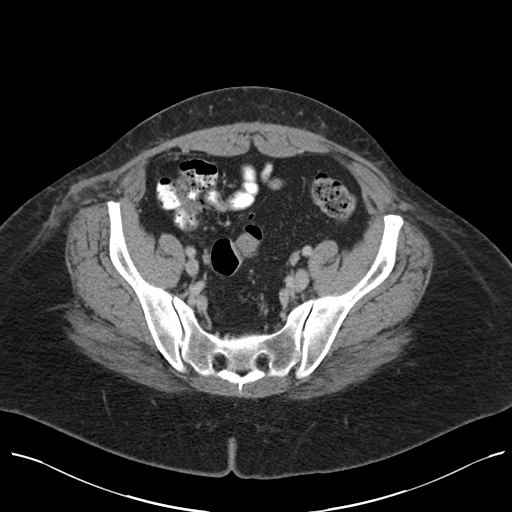
[im 64/160  soft-tissue]
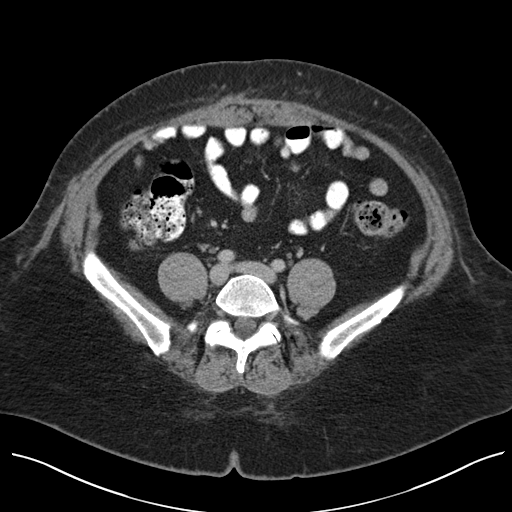
[im 80/160  soft-tissue]
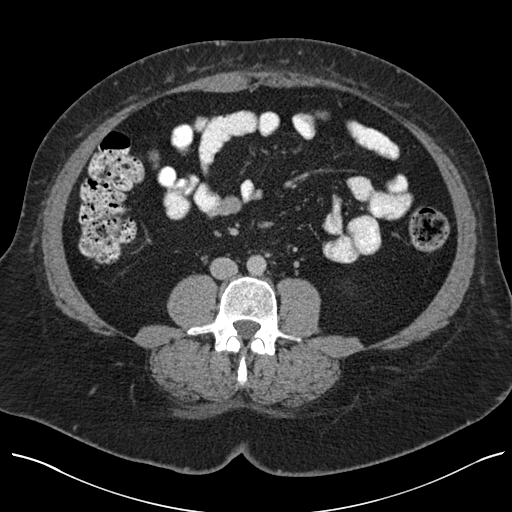
[im 96/160  soft-tissue]
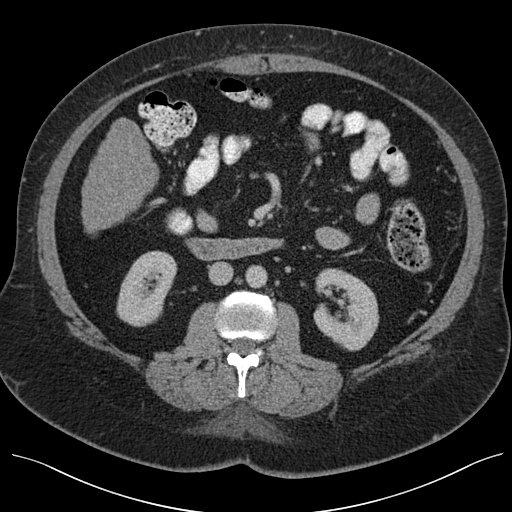
[im 96/160  lung]
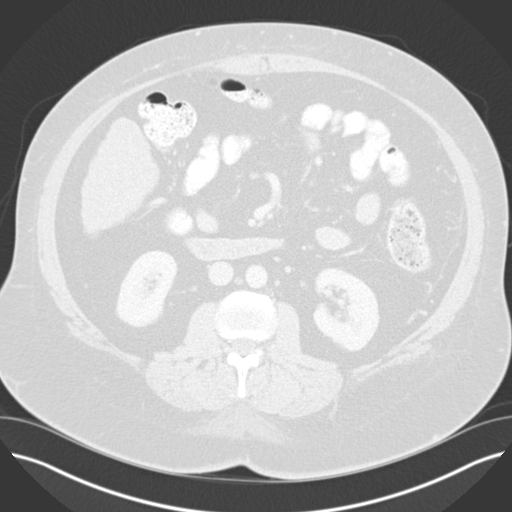
[im 112/160  soft-tissue]
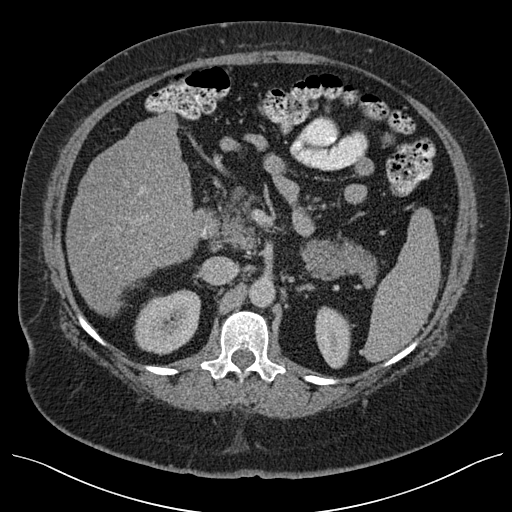
[im 112/160  lung]
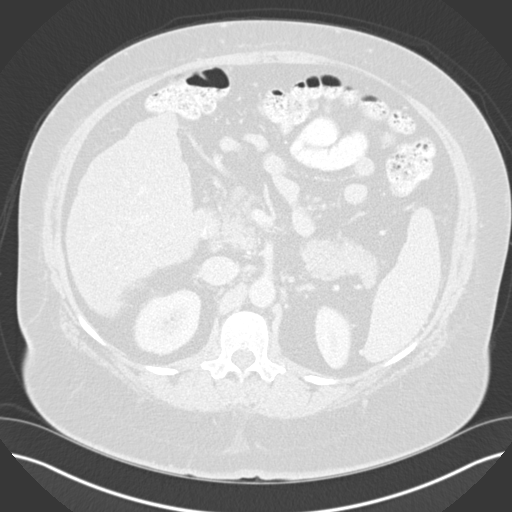
[im 128/160  soft-tissue]
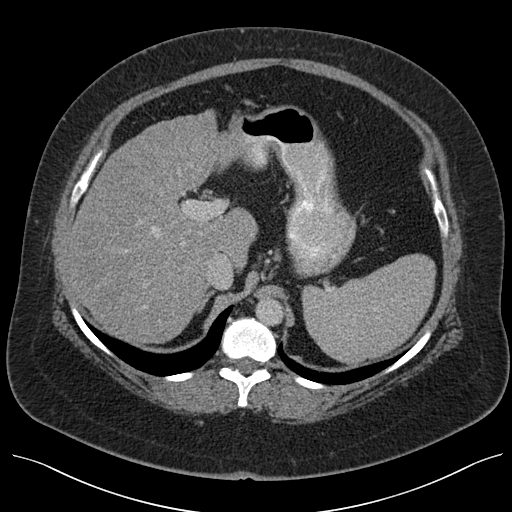
[im 128/160  lung]
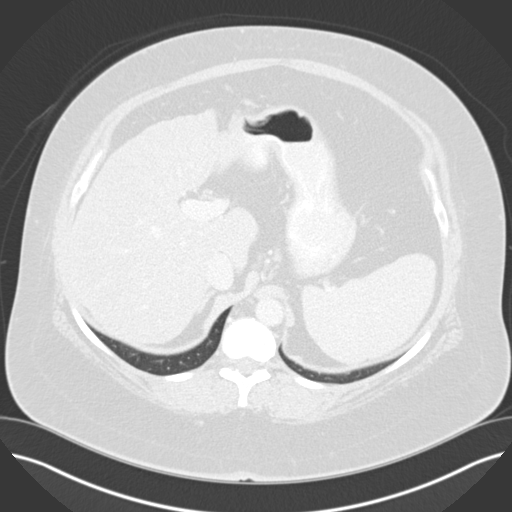
[im 144/160  soft-tissue]
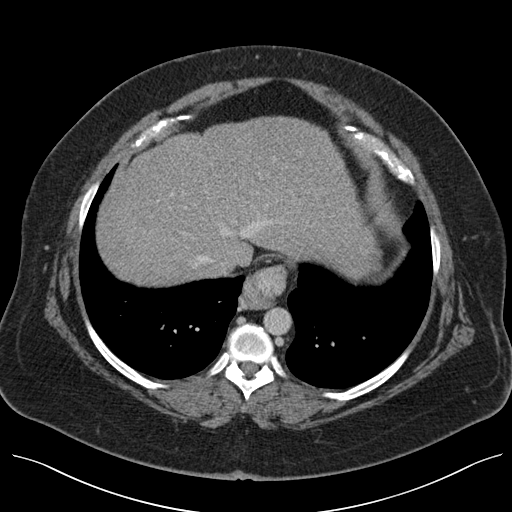
[im 144/160  lung]
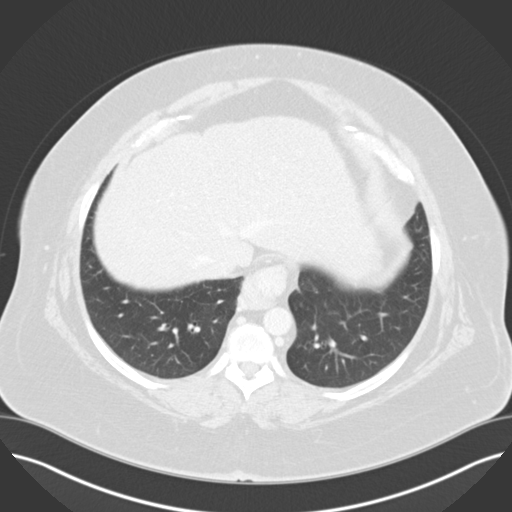
[im 144/160  bone]
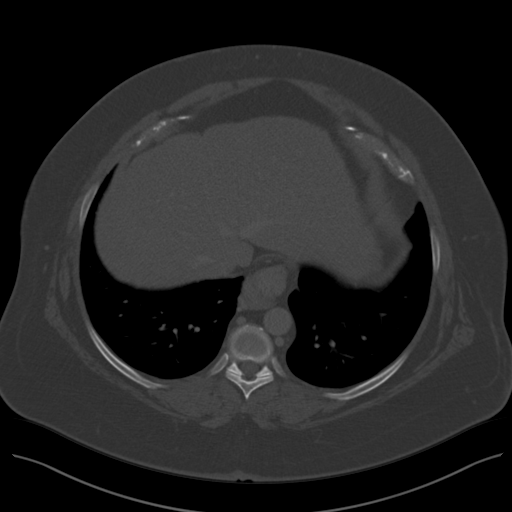

[Series 10: venous coronal · coronal · portal-venous · 0.78mm/px · 2 of 151 slices shown]
[im 51/151  soft-tissue]
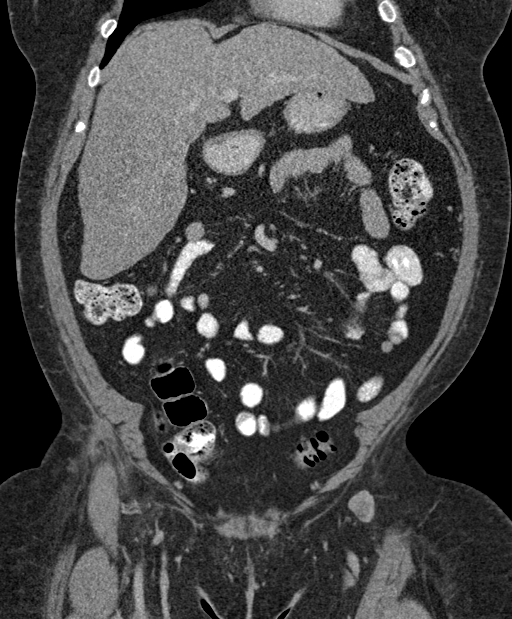
[im 101/151  soft-tissue]
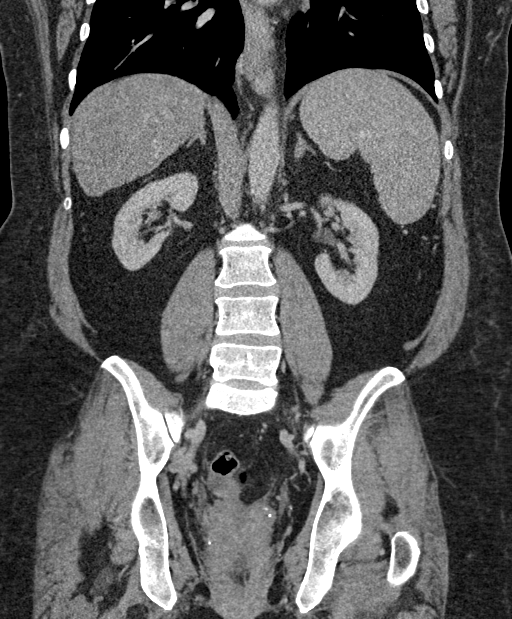

[11 of 46 positions shown; findings below may reference images not displayed]

RADIATION DOSE REDUCTION: This exam was performed according to the
departmental dose-optimization program which includes automated
exposure control, adjustment of the mA and/or kV according to
patient size and/or use of iterative reconstruction technique.

CONTRAST:  100mL OMNIPAQUE IOHEXOL 300 MG/ML  SOLN
FINDINGS: Lower chest: No acute abnormality.

Hepatobiliary: Cirrhotic hepatic morphology. Arterially enhancing 12
mm segment VI hepatic lesion on image 36/2 does not definitely
demonstrate washout or capsule. No additional arterially enhancing
hepatic lesions identified. Stable size of the 4 mm hypodensity in
the right lobe of the liver on image [DATE] which is favored to
reflect a benign hepatic cysts.

Gallbladder surgically absent.  No biliary ductal dilation.

Pancreas: No pancreatic ductal dilation or evidence of acute
inflammation.

Spleen: Mild splenomegaly measuring 13.9 cm in maximum craniocaudal
dimension.

Adrenals/Urinary Tract: Bilateral adrenal glands are within normal
limits. No hydronephrosis. Punctate nonobstructive left lower pole
renal calculus. Kidneys demonstrate symmetric enhancement and
excretion of contrast material.

Stomach/Bowel: Radiopaque enteric contrast material traverses the
descending colon. Small hiatal hernia. Otherwise the stomach is
unremarkable for degree of distension. No pathologic dilation or
evidence of acute inflammation involving loops of large or small
bowel in the abdomen.

Vascular/Lymphatic: Aortic atherosclerosis without aneurysmal
dilation. The portal, splenic and superior mesenteric veins are
patent. Paraesophageal and splenorenal collateral vessels. Similar
enlarged upper abdominal lymph nodes for instance a 13 mm portacaval
lymph node on image 46/5 and a 19 mm periportal lymph node on image
36/5.

Reproductive: Uterus and bilateral adnexa are unremarkable.

Other: No significant abdominopelvic free fluid.

Musculoskeletal: No acute osseous abnormality.
IMPRESSION: 1. Cirrhotic hepatic morphology with evidence of portal hypertension
including splenomegaly and paraesophageal and splenorenal collateral
vessels.
2. Arterially enhancing 12 mm segment VI hepatic lesion is most
consistent with [place]unqing Nails category 3 lesion (intermediate
probability of malignancy) recommend follow-up hepatic protocol MRI
or CT with and without contrast in 3-6 months to assess stability.
3. Similar enlarged upper abdominal lymph nodes, which are again
favored reactive.
4. Punctate nonobstructive left lower pole renal calculus.
5. Aortic Atherosclerosis ([id number]).

## 2023-01-10 ENCOUNTER — Telehealth: Payer: Self-pay | Admitting: Psychiatry

## 2023-01-10 ENCOUNTER — Ambulatory Visit: Payer: BC Managed Care – PPO | Admitting: Psychology

## 2023-01-10 ENCOUNTER — Other Ambulatory Visit: Payer: Self-pay

## 2023-01-10 MED ORDER — LAMOTRIGINE 150 MG PO TABS: 150.0000 mg | ORAL_TABLET | Freq: Every day | ORAL | 0 refills | Status: DC

## 2023-01-10 NOTE — Telephone Encounter (Signed)
Patient wanted one tab for 150 mg. New Rx sent with note to cancel previous one.

## 2023-01-10 NOTE — Telephone Encounter (Signed)
Patient called in stating that at last visit with JC she was prescribed Lamictal for '150mg'$ . She spoke with pharmacy and they informed her that the prescription was sent in as '100mg'$ . She needs new prescription sent in for the '150mg'$  instead. Ph: [phone] Appt 6/10 Pharmacy [facility] [place]

## 2023-01-24 ENCOUNTER — Ambulatory Visit (INDEPENDENT_AMBULATORY_CARE_PROVIDER_SITE_OTHER): Payer: BC Managed Care – PPO | Admitting: Psychology

## 2023-01-24 DIAGNOSIS — F32A Depression, unspecified: Secondary | ICD-10-CM

## 2023-01-24 DIAGNOSIS — F5081 Binge eating disorder: Secondary | ICD-10-CM | POA: Diagnosis not present

## 2023-01-24 DIAGNOSIS — F419 Anxiety disorder, unspecified: Secondary | ICD-10-CM | POA: Diagnosis not present

## 2023-01-24 NOTE — Progress Notes (Signed)
[facility] Counselor/Therapist Progress Note  Patient ID: Jacqueline Fowler, MRN: [id number],    Date: 01/24/2023  Time Spent: 60 minutes  Treatment Type: Individual Therapy  Reported Symptoms: depression  Mental Status Exam: Appearance:  Casual     Behavior: Appropriate  Motor: Normal  Speech/Language:  Normal Rate  Affect: Blunt  Mood: depressed  Thought process: normal  Thought content:   WNL  Sensory/Perceptual disturbances:   WNL  Orientation: oriented to person, place, time/date, and situation  Attention: Good  Concentration: Good  Memory: WNL  Fund of knowledge:  Good  Insight:   Good  Judgment:  Good  Impulse Control: Good   Risk Assessment: Danger to Self:  No Self-injurious Behavior: No Danger to Others: No Duty to Warn:no Physical Aggression / Violence:No  Access to Firearms a concern: No  Gang Involvement:No   Subjective: The patient attended a face-to-face individual therapy session via video visit today.  The patient gave verbal consent for the session to be on video on WebEx.  The patient was in her home alone and the therapist was in the office.  The patient presents with a blunted affect and mood is depressed.  The patient was having a very difficult time being positive today.  She reports in the last few weeks she has really struggled with being depressed.  She states that 1 evening she made some margaritas and she knows that this will effect her liver but she did it anyway and her son came in the room and told her that he was not happy with her because of this behavior because he wanted to make sure his mother was around for all the important things in his life.  We talked about how the brain works and it seems that she is still struggling with feeling unworthy and I made an appointment for next week to come in and do EMDR.  I believe her core belief is I am not worthy and we need to target that. Interventions: Cognitive Behavioral Therapy,  EMDR  Diagnosis:Anxiety disorder, unspecified type  Depression, unspecified depression type  Binge eating disorder  Plan: Client Abilities/Strengths  Intelligent, motivated, insightful  Client Treatment Preferences  Outpatient Individual therapy  Client Statement of Needs  "I need some help with my depression"  Treatment Level  Outpatient Individual therapy  Symptoms  Depressed or irritable mood.:  (Status: Improved). Feelings of hopelessness,  worthlessness, or inappropriate guilt.: (Status: maintained). History of chronic  or recurrent depression for which the client has taken antidepressant medication, been hospitalized, had outpatient treatment, or had a course of electroconvulsive therapy.: (Status:  maintained). Lack of energy.:  (Status: maintained). Low self-esteem.:  (Status: maintained). Poor concentration and indecisiveness.: (Status: maintained). Sleeplessness or hypersomnia.: (Status:  maintained). Social withdrawal.: (Status: maintained).  Problems Addressed  Unipolar Depression, Unipolar Depression    Goals 1. Develop healthy interpersonal relationships that lead to the alleviation  and help prevent the relapse of depression. 2. Develop healthy thinking patterns and beliefs about self, others, and the world that lead to the alleviation and help prevent the relapse of  depression. Objective Verbalize an understanding of healthy and unhealthy emotions with the intent of increasing the use of  healthy emotions to guide actions. Target Date: 2023-05-02 Frequency: Weekly Progress: 40Modality: individual Objective Learn and implement behavioral strategies to overcome depression. Target Date: 2023-05-02 Frequency: Weekly Progress: 40 Modality: individual Related Interventions 1. Assist the client in developing skills that increase the likelihood of deriving pleasure  from  behavioral activation (e.g., assertiveness skills, developing an exercise plan, less  internal/more  external focus, increased social involvement); reinforce success. Objective Describe current and past experiences with depression including their impact on functioning and  attempts to resolve it. Target Date: 2023-05-02 Frequency: Weekly Progress: 40 Modality: individual Related Interventions 1. Encourage the client to share his/her thoughts and feelings of depression; express empathy and  build rapport while identifying primary cognitive, behavioral, interpersonal, or other  contributors to depression.  Objective Learn and implement problem-solving and decision-making skills. Target Date: 2023-05-02 Frequency: Weekly Progress: 40 Modality: individual Related Interventions 1. Encourage in the client the development of a positive problem orientation in which problems  and solving them are viewed as a natural part of life and not something to be feared, despaired,  or avoided. Objective Identify and replace thoughts and beliefs that support depression. Target Date: 2023-05-02 Frequency: Weekly Progress: 10 Modality: individual Related Interventions 1. Conduct Cognitive-Behavioral Therapy (see Cognitive Behavior Therapy by Olevia Bowens; Overcoming Depression by Lynita Lombard al.), beginning with helping the client learn the connection among  cognition, depressive feelings, and actions. 2. Facilitate and reinforce the client's shift from biased depressive self-talk and beliefs to realitybased cognitive messages that enhance self-confidence and increase adaptive actions (see  "Positive Self-Talk" in the Adult Psychotherapy Homework Planner by Bryn Gulling). Diagnosis Axis  none 296.32 (Major depressive affective disorder, recurrent episode, moderate) - Open -  [Signifier: n/a]  Conditions For Discharge Achievement of treatment goals and objectives Will continue to see the patient at least biweekly and work with her using CBT, Insight oriented approach and EMDR.  Patient approved the  treatment plan.  Judas Mohammad G Michole Lecuyer, LCSW

## 2023-01-29 ENCOUNTER — Ambulatory Visit: Payer: BC Managed Care – PPO | Admitting: Psychology

## 2023-01-29 DIAGNOSIS — F419 Anxiety disorder, unspecified: Secondary | ICD-10-CM | POA: Diagnosis not present

## 2023-01-29 DIAGNOSIS — F5081 Binge eating disorder: Secondary | ICD-10-CM

## 2023-01-29 DIAGNOSIS — F32A Depression, unspecified: Secondary | ICD-10-CM | POA: Diagnosis not present

## 2023-01-29 NOTE — Progress Notes (Signed)
[facility] Counselor/Therapist Progress Note  Patient ID: Jacqueline Fowler, MRN: [id number],    Date: 01/29/2023  Time Spent: 60 minutes  Treatment Type: Individual Therapy  Reported Symptoms: depression  Mental Status Exam: Appearance:  Casual     Behavior: Appropriate  Motor: Normal  Speech/Language:  Normal Rate  Affect: Blunt  Mood: depressed  Thought process: normal  Thought content:   WNL  Sensory/Perceptual disturbances:   WNL  Orientation: oriented to person, place, time/date, and situation  Attention: Good  Concentration: Good  Memory: WNL  Fund of knowledge:  Good  Insight:   Good  Judgment:  Good  Impulse Control: Good   Risk Assessment: Danger to Self:  No Self-injurious Behavior: No Danger to Others: No Duty to Warn:no Physical Aggression / Violence:No  Access to Firearms a concern: No  Gang Involvement:No   Subjective: The patient attended a face-to-face individual therapy session in the office today.  The patient presents with a blunted affect and mood is depressed.  The patient reports that she has been having difficulty with being sad again.  The issue that she has is that she has the expectation that other people are going to treat her like she wants to be treated or that she would treat them.  I explained to the patient that we have to get her thinking a different way.  I explained that most people are not going to be like she is and that she has to embrace that she is a good person and that others may not be like her.  I explained to her that we need to get her feeling better and more secure about herself and I would like to for her to look at the situation as I am a good person and I turned out great even though I had difficult things happen.  I encouraged her to schedule more sessions closer together because I think that she needs more intensive therapy instead of just once a month.  She is chosen to do once a month because of insurance  issues she is going to talk with her husband and I would like to try to see her in the office at least a few more times so that we can do the EMDR. Interventions: Cognitive Behavioral Therapy, EMDR  Diagnosis:Anxiety disorder, unspecified type  Depression, unspecified depression type  Binge eating disorder  Plan: Client Abilities/Strengths  Intelligent, motivated, insightful  Client Treatment Preferences  Outpatient Individual therapy  Client Statement of Needs  "I need some help with my depression"  Treatment Level  Outpatient Individual therapy  Symptoms  Depressed or irritable mood.:  (Status: Improved). Feelings of hopelessness,  worthlessness, or inappropriate guilt.: (Status: maintained). History of chronic  or recurrent depression for which the client has taken antidepressant medication, been hospitalized, had outpatient treatment, or had a course of electroconvulsive therapy.: (Status:  maintained). Lack of energy.:  (Status: maintained). Low self-esteem.:  (Status: maintained). Poor concentration and indecisiveness.: (Status: maintained). Sleeplessness or hypersomnia.: (Status:  maintained). Social withdrawal.: (Status: maintained).  Problems Addressed  Unipolar Depression, Unipolar Depression    Goals 1. Develop healthy interpersonal relationships that lead to the alleviation  and help prevent the relapse of depression. 2. Develop healthy thinking patterns and beliefs about self, others, and the world that lead to the alleviation and help prevent the relapse of  depression. Objective Verbalize an understanding of healthy and unhealthy emotions with the intent of increasing the use of  healthy emotions to  guide actions. Target Date: 2023-05-02 Frequency: Weekly Progress: 40Modality: individual Objective Learn and implement behavioral strategies to overcome depression. Target Date: 2023-05-02 Frequency: Weekly Progress: 40 Modality: individual Related  Interventions 1. Assist the client in developing skills that increase the likelihood of deriving pleasure from  behavioral activation (e.Jacqueline., assertiveness skills, developing an exercise plan, less internal/more  external focus, increased social involvement); reinforce success. Objective Describe current and past experiences with depression including their impact on functioning and  attempts to resolve it. Target Date: 2023-05-02 Frequency: Weekly Progress: 40 Modality: individual Related Interventions 1. Encourage the client to share his/her thoughts and feelings of depression; express empathy and  build rapport while identifying primary cognitive, behavioral, interpersonal, or other  contributors to depression.  Objective Learn and implement problem-solving and decision-making skills. Target Date: 2023-05-02 Frequency: Weekly Progress: 40 Modality: individual Related Interventions 1. Encourage in the client the development of a positive problem orientation in which problems  and solving them are viewed as a natural part of life and not something to be feared, despaired,  or avoided. Objective Identify and replace thoughts and beliefs that support depression. Target Date: 2023-05-02 Frequency: Weekly Progress: 10 Modality: individual Related Interventions 1. Conduct Cognitive-Behavioral Therapy (see Cognitive Behavior Therapy by Jacqueline Fowler; Overcoming Depression by Jacqueline Fowler al.), beginning with helping the client learn the connection among  cognition, depressive feelings, and actions. 2. Facilitate and reinforce the client's shift from biased depressive self-talk and beliefs to realitybased cognitive messages that enhance self-confidence and increase adaptive actions (see  "Positive Self-Talk" in the Adult Psychotherapy Homework Planner by Jacqueline Fowler). Diagnosis Axis  none 296.32 (Major depressive affective disorder, recurrent episode, moderate) - Open -  [Signifier: n/a]  Conditions  For Discharge Achievement of treatment goals and objectives Will continue to see the patient at least biweekly and work with her using CBT, Insight oriented approach and EMDR.  Patient approved the treatment plan.  Jacqueline Fowler Jacqueline Tais Koestner, LCSW

## 2023-02-03 DIAGNOSIS — N390 Urinary tract infection, site not specified: Secondary | ICD-10-CM | POA: Diagnosis not present

## 2023-02-03 DIAGNOSIS — M150 Primary generalized (osteo)arthritis: Secondary | ICD-10-CM | POA: Diagnosis not present

## 2023-02-03 DIAGNOSIS — K219 Gastro-esophageal reflux disease without esophagitis: Secondary | ICD-10-CM | POA: Diagnosis not present

## 2023-02-03 DIAGNOSIS — R5383 Other fatigue: Secondary | ICD-10-CM | POA: Diagnosis not present

## 2023-02-03 DIAGNOSIS — I10 Essential (primary) hypertension: Secondary | ICD-10-CM | POA: Diagnosis not present

## 2023-02-05 ENCOUNTER — Ambulatory Visit: Payer: BC Managed Care – PPO | Admitting: Psychology

## 2023-02-05 DIAGNOSIS — F5081 Binge eating disorder: Secondary | ICD-10-CM

## 2023-02-05 DIAGNOSIS — F32A Depression, unspecified: Secondary | ICD-10-CM | POA: Diagnosis not present

## 2023-02-05 DIAGNOSIS — F419 Anxiety disorder, unspecified: Secondary | ICD-10-CM | POA: Diagnosis not present

## 2023-02-05 NOTE — Progress Notes (Signed)
[facility] Counselor/Therapist Progress Note  Patient ID: Jacqueline Fowler, MRN: [id number],    Date: 02/05/2023  Time Spent: 60 minutes  Treatment Type: Individual Therapy  Reported Symptoms: depression  Mental Status Exam: Appearance:  Casual     Behavior: Appropriate  Motor: Normal  Speech/Language:  Normal Rate  Affect: Blunt  Mood: depressed  Thought process: normal  Thought content:   WNL  Sensory/Perceptual disturbances:   WNL  Orientation: oriented to person, place, time/date, and situation  Attention: Good  Concentration: Good  Memory: WNL  Fund of knowledge:  Good  Insight:   Good  Judgment:  Good  Impulse Control: Good   Risk Assessment: Danger to Self:  No Self-injurious Behavior: No Danger to Others: No Duty to Warn:no Physical Aggression / Violence:No  Access to Firearms a concern: No  Gang Involvement:No   Subjective: The patient attended a face-to-face individual therapy session in the office today.  The patient presents with a blunted affect and mood is depressed.  Today the patient comes in and we went ahead and started our EMDR.  We started addressing the negative cognition of "I wish I were never born".  We work towards installing "I am good enough".  The patient reported going from a 10 down to a 7 during the session with her suds scale.  We will continue to work on decreasing that number further at our next session.  The patient is supposed to notice how she feels between now and her next time together.  We scheduled another session next week.  Interventions: Cognitive Behavioral Therapy, EMDR  Diagnosis:Anxiety disorder, unspecified type  Depression, unspecified depression type  Binge eating disorder  Plan: Client Abilities/Strengths  Intelligent, motivated, insightful  Client Treatment Preferences  Outpatient Individual therapy  Client Statement of Needs  "I need some help with my depression"  Treatment Level  Outpatient  Individual therapy  Symptoms  Depressed or irritable mood.:  (Status: Improved). Feelings of hopelessness,  worthlessness, or inappropriate guilt.: (Status: maintained). History of chronic  or recurrent depression for which the client has taken antidepressant medication, been hospitalized, had outpatient treatment, or had a course of electroconvulsive therapy.: (Status:  maintained). Lack of energy.:  (Status: maintained). Low self-esteem.:  (Status: maintained). Poor concentration and indecisiveness.: (Status: maintained). Sleeplessness or hypersomnia.: (Status:  maintained). Social withdrawal.: (Status: maintained).  Problems Addressed  Unipolar Depression, Unipolar Depression    Goals 1. Develop healthy interpersonal relationships that lead to the alleviation  and help prevent the relapse of depression. 2. Develop healthy thinking patterns and beliefs about self, others, and the world that lead to the alleviation and help prevent the relapse of  depression. Objective Verbalize an understanding of healthy and unhealthy emotions with the intent of increasing the use of  healthy emotions to guide actions. Target Date: 2023-05-02 Frequency: Weekly Progress: 40Modality: individual Objective Learn and implement behavioral strategies to overcome depression. Target Date: 2023-05-02 Frequency: Weekly Progress: 40 Modality: individual Related Interventions 1. Assist the client in developing skills that increase the likelihood of deriving pleasure from  behavioral activation (e.g., assertiveness skills, developing an exercise plan, less internal/more  external focus, increased social involvement); reinforce success. Objective Describe current and past experiences with depression including their impact on functioning and  attempts to resolve it. Target Date: 2023-05-02 Frequency: Weekly Progress: 40 Modality: individual Related Interventions 1. Encourage the client to share his/her  thoughts and feelings of depression; express empathy and  build rapport while identifying primary cognitive, behavioral,  interpersonal, or other  contributors to depression.  Objective Learn and implement problem-solving and decision-making skills. Target Date: 2023-05-02 Frequency: Weekly Progress: 40 Modality: individual Related Interventions 1. Encourage in the client the development of a positive problem orientation in which problems  and solving them are viewed as a natural part of life and not something to be feared, despaired,  or avoided. Objective Identify and replace thoughts and beliefs that support depression. Target Date: 2023-05-02 Frequency: Weekly Progress: 10 Modality: individual Related Interventions 1. Conduct Cognitive-Behavioral Therapy (see Cognitive Behavior Therapy by Olevia Bowens; Overcoming Depression by Lynita Lombard al.), beginning with helping the client learn the connection among  cognition, depressive feelings, and actions. 2. Facilitate and reinforce the client's shift from biased depressive self-talk and beliefs to realitybased cognitive messages that enhance self-confidence and increase adaptive actions (see  "Positive Self-Talk" in the Adult Psychotherapy Homework Planner by Bryn Gulling). Diagnosis Axis  none 296.32 (Major depressive affective disorder, recurrent episode, moderate) - Open -  [Signifier: n/a]  Conditions For Discharge Achievement of treatment goals and objectives Will continue to see the patient at least biweekly and work with her using CBT, Insight oriented approach and EMDR.  Patient approved the treatment plan.  Frederick Klinger G Haron Beilke, LCSW

## 2023-02-07 ENCOUNTER — Ambulatory Visit: Payer: BC Managed Care – PPO | Admitting: Psychology

## 2023-02-12 ENCOUNTER — Ambulatory Visit: Payer: BC Managed Care – PPO | Admitting: Psychology

## 2023-02-12 DIAGNOSIS — F32A Depression, unspecified: Secondary | ICD-10-CM

## 2023-02-12 DIAGNOSIS — F5081 Binge eating disorder: Secondary | ICD-10-CM | POA: Diagnosis not present

## 2023-02-12 DIAGNOSIS — F419 Anxiety disorder, unspecified: Secondary | ICD-10-CM

## 2023-02-13 NOTE — Progress Notes (Signed)
[facility] Counselor/Therapist Progress Note  Patient ID: Jacqueline Fowler, MRN: [id number],    Date: 02/12/2023  Time Spent: 60 minutes  Treatment Type: Individual Therapy  Reported Symptoms: depression  Mental Status Exam: Appearance:  Casual     Behavior: Appropriate  Motor: Normal  Speech/Language:  Normal Rate  Affect: Blunt  Mood: pleasant  Thought process: normal  Thought content:   WNL  Sensory/Perceptual disturbances:   WNL  Orientation: oriented to person, place, time/date, and situation  Attention: Good  Concentration: Good  Memory: WNL  Fund of knowledge:  Good  Insight:   Good  Judgment:  Good  Impulse Control: Good   Risk Assessment: Danger to Self:  No Self-injurious Behavior: No Danger to Others: No Duty to Warn:no Physical Aggression / Violence:No  Access to Firearms a concern: No  Gang Involvement:No   Subjective: The patient attended a face-to-face individual therapy session in the office today.  The patient presents with a blunted affect and mood is pleasant.  She does report that she does feel better since we did EMDR last week.  She reports that she is not sure if she believes it because she was not sure that it would work that quickly.  I explained to her that it does work that quickly and that it seems that she had some good insights after we had the session last week.  We talked about her expectations again related to family members and how people respond to her and it seems that she has a better grasp on why people do not respond like she does.  We talked about her continuing to work with herself on having realistic expectations of them from where they are as opposed to what she might do if it were her.  We we will continue to work on helping her decrease her depression and we will utilize EMDR further to help her be able to process things in a different way. Interventions: Cognitive Behavioral Therapy, EMDR  Diagnosis:Anxiety  disorder, unspecified type  Depression, unspecified depression type  Binge eating disorder  Plan: Client Abilities/Strengths  Intelligent, motivated, insightful  Client Treatment Preferences  Outpatient Individual therapy  Client Statement of Needs  "I need some help with my depression"  Treatment Level  Outpatient Individual therapy  Symptoms  Depressed or irritable mood.:  (Status: Improved). Feelings of hopelessness,  worthlessness, or inappropriate guilt.: (Status: maintained). History of chronic  or recurrent depression for which the client has taken antidepressant medication, been hospitalized, had outpatient treatment, or had a course of electroconvulsive therapy.: (Status:  maintained). Lack of energy.:  (Status: maintained). Low self-esteem.:  (Status: maintained). Poor concentration and indecisiveness.: (Status: maintained). Sleeplessness or hypersomnia.: (Status:  maintained). Social withdrawal.: (Status: maintained).  Problems Addressed  Unipolar Depression, Unipolar Depression    Goals 1. Develop healthy interpersonal relationships that lead to the alleviation  and help prevent the relapse of depression. 2. Develop healthy thinking patterns and beliefs about self, others, and the world that lead to the alleviation and help prevent the relapse of  depression. Objective Verbalize an understanding of healthy and unhealthy emotions with the intent of increasing the use of  healthy emotions to guide actions. Target Date: 2023-05-02 Frequency: Weekly Progress: 40Modality: individual Objective Learn and implement behavioral strategies to overcome depression. Target Date: 2023-05-02 Frequency: Weekly Progress: 40 Modality: individual Related Interventions 1. Assist the client in developing skills that increase the likelihood of deriving pleasure from  behavioral activation (e.g., assertiveness skills, developing  an exercise plan, less internal/more  external focus,  increased social involvement); reinforce success. Objective Describe current and past experiences with depression including their impact on functioning and  attempts to resolve it. Target Date: 2023-05-02 Frequency: Weekly Progress: 40 Modality: individual Related Interventions 1. Encourage the client to share his/her thoughts and feelings of depression; express empathy and  build rapport while identifying primary cognitive, behavioral, interpersonal, or other  contributors to depression.  Objective Learn and implement problem-solving and decision-making skills. Target Date: 2023-05-02 Frequency: Weekly Progress: 40 Modality: individual Related Interventions 1. Encourage in the client the development of a positive problem orientation in which problems  and solving them are viewed as a natural part of life and not something to be feared, despaired,  or avoided. Objective Identify and replace thoughts and beliefs that support depression. Target Date: 2023-05-02 Frequency: Weekly Progress: 10 Modality: individual Related Interventions 1. Conduct Cognitive-Behavioral Therapy (see Cognitive Behavior Therapy by Olevia Bowens; Overcoming Depression by Lynita Lombard al.), beginning with helping the client learn the connection among  cognition, depressive feelings, and actions. 2. Facilitate and reinforce the client's shift from biased depressive self-talk and beliefs to realitybased cognitive messages that enhance self-confidence and increase adaptive actions (see  "Positive Self-Talk" in the Adult Psychotherapy Homework Planner by Bryn Gulling). Diagnosis Axis  none 296.32 (Major depressive affective disorder, recurrent episode, moderate) - Open -  [Signifier: n/a]  Conditions For Discharge Achievement of treatment goals and objectives Will continue to see the patient at least biweekly and work with her using CBT, Insight oriented approach and EMDR.  Patient approved the treatment plan.  Jadzia Ibsen G  Yunique Dearcos, LCSW

## 2023-02-19 ENCOUNTER — Other Ambulatory Visit: Payer: Self-pay | Admitting: Gastroenterology

## 2023-02-19 DIAGNOSIS — R197 Diarrhea, unspecified: Secondary | ICD-10-CM

## 2023-02-21 ENCOUNTER — Ambulatory Visit: Payer: BC Managed Care – PPO | Admitting: Psychology

## 2023-03-05 DIAGNOSIS — J069 Acute upper respiratory infection, unspecified: Secondary | ICD-10-CM | POA: Diagnosis not present

## 2023-03-05 DIAGNOSIS — U071 COVID-19: Secondary | ICD-10-CM | POA: Diagnosis not present

## 2023-03-05 DIAGNOSIS — Z20828 Contact with and (suspected) exposure to other viral communicable diseases: Secondary | ICD-10-CM | POA: Diagnosis not present

## 2023-03-07 ENCOUNTER — Ambulatory Visit: Payer: BC Managed Care – PPO | Admitting: Psychology

## 2023-03-21 ENCOUNTER — Ambulatory Visit: Payer: BC Managed Care – PPO | Admitting: Psychology

## 2023-03-21 ENCOUNTER — Other Ambulatory Visit: Payer: Self-pay | Admitting: Psychiatry

## 2023-03-21 DIAGNOSIS — F32A Depression, unspecified: Secondary | ICD-10-CM | POA: Diagnosis not present

## 2023-03-21 DIAGNOSIS — F5081 Binge eating disorder: Secondary | ICD-10-CM

## 2023-03-21 DIAGNOSIS — F419 Anxiety disorder, unspecified: Secondary | ICD-10-CM | POA: Diagnosis not present

## 2023-03-21 NOTE — Progress Notes (Signed)
[facility] Counselor/Therapist Progress Note  Patient ID: Jacqueline Fowler, MRN: [id number],    Date: 03/21/2023  Time Spent: 60 minutes  Treatment Type: Individual Therapy  Reported Symptoms: depression  Mental Status Exam: Appearance:  Casual     Behavior: Appropriate  Motor: Normal  Speech/Language:  Normal Rate  Affect: Blunt  Mood: sad  Thought process: normal  Thought content:   WNL  Sensory/Perceptual disturbances:   WNL  Orientation: oriented to person, place, time/date, and situation  Attention: Good  Concentration: Good  Memory: WNL  Fund of knowledge:  Good  Insight:   Good  Judgment:  Good  Impulse Control: Good   Risk Assessment: Danger to Self:  No Self-injurious Behavior: No Danger to Others: No Duty to Warn:no Physical Aggression / Violence:No  Access to Firearms a concern: No  Gang Involvement:No   Subjective: The patient attended a face-to-face individual therapy session in the office today.  The patient presents with a blunted affect and mood is sad.  The patient reports that she feels like she might have been doing better for a while after we did the EMDR but she has gotten back into negative thinking and is very negative about herself and does not like her insides or her outsides.  We talked about doing more EMDR and are working on figuring out the negative cognition that we need to focus on.  We talked about her needing to be able to catch herself when she is thinking negative thoughts and redirect her thoughts.  In addition we talked about the possibility that she is punishing herself for some reason and we possibly need to do the EMDR around that.  We talked about her potentially finding a weight loss plan to start.  I told her that I could be her accountability partner if she would find something that she felt like she would do.  She essentially is slowly killing herself by not taking care of herself and she did admit that she sometimes  has thoughts of that even though she would not actively take her own life.  We will continue to work on decreasing her self-loathing.  Interventions: Cognitive Behavioral Therapy, EMDR  Diagnosis:Anxiety disorder, unspecified type  Depression, unspecified depression type  Binge eating disorder  Plan: Client Abilities/Strengths  Intelligent, motivated, insightful  Client Treatment Preferences  Outpatient Individual therapy  Client Statement of Needs  "I need some help with my depression"  Treatment Level  Outpatient Individual therapy  Symptoms  Depressed or irritable mood.:  (Status: Improved). Feelings of hopelessness,  worthlessness, or inappropriate guilt.: (Status: maintained). History of chronic  or recurrent depression for which the client has taken antidepressant medication, been hospitalized, had outpatient treatment, or had a course of electroconvulsive therapy.: (Status:  maintained). Lack of energy.:  (Status: maintained). Low self-esteem.:  (Status: maintained). Poor concentration and indecisiveness.: (Status: maintained). Sleeplessness or hypersomnia.: (Status:  maintained). Social withdrawal.: (Status: maintained).  Problems Addressed  Unipolar Depression, Unipolar Depression    Goals 1. Develop healthy interpersonal relationships that lead to the alleviation  and help prevent the relapse of depression. 2. Develop healthy thinking patterns and beliefs about self, others, and the world that lead to the alleviation and help prevent the relapse of  depression. Objective Verbalize an understanding of healthy and unhealthy emotions with the intent of increasing the use of  healthy emotions to guide actions. Target Date: 2023-05-02 Frequency: Weekly Progress: : individual Objective Learn and implement behavioral strategies to overcome depression.  Target Date: 2023-05-02 Frequency: Weekly Progress: 40 Modality: individual Related Interventions 1. Assist  the client in developing skills that increase the likelihood of deriving pleasure from  behavioral activation (e.g., assertiveness skills, developing an exercise plan, less internal/more  external focus, increased social involvement); reinforce success. Objective Describe current and past experiences with depression including their impact on functioning and  attempts to resolve it. Target Date: 2023-05-02 Frequency: Weekly Progress: 40 Modality: individual Related Interventions 1. Encourage the client to share his/her thoughts and feelings of depression; express empathy and  build rapport while identifying primary cognitive, behavioral, interpersonal, or other  contributors to depression.  Objective Learn and implement problem-solving and decision-making skills. Target Date: 2023-05-02 Frequency: Weekly Progress: 40 Modality: individual Related Interventions 1. Encourage in the client the development of a positive problem orientation in which problems  and solving them are viewed as a natural part of life and not something to be feared, despaired,  or avoided. Objective Identify and replace thoughts and beliefs that support depression. Target Date: 2023-05-02 Frequency: Weekly Progress: 10 Modality: individual Related Interventions 1. Conduct Cognitive-Behavioral Therapy (see Cognitive Behavior Therapy by Reola Calkins; Overcoming Depression by Agapito Games al.), beginning with helping the client learn the connection among  cognition, depressive feelings, and actions. 2. Facilitate and reinforce the client's shift from biased depressive self-talk and beliefs to realitybased cognitive messages that enhance self-confidence and increase adaptive actions (see  "Positive Self-Talk" in the Adult Psychotherapy Homework Planner by Stephannie Li). Diagnosis Axis  none 296.32 (Major depressive affective disorder, recurrent episode, moderate) - Open -  [Signifier: n/a]  Conditions For Discharge Achievement  of treatment goals and objectives Will continue to see the patient at least biweekly and work with her using CBT, Insight oriented approach and EMDR.  Patient approved the treatment plan.  Santo Zahradnik G Brayln Duque, LCSW

## 2023-03-31 ENCOUNTER — Encounter: Payer: Self-pay | Admitting: Gastroenterology

## 2023-04-01 DIAGNOSIS — Z1231 Encounter for screening mammogram for malignant neoplasm of breast: Secondary | ICD-10-CM | POA: Diagnosis not present

## 2023-04-11 ENCOUNTER — Telehealth: Payer: Self-pay | Admitting: Psychiatry

## 2023-04-11 ENCOUNTER — Other Ambulatory Visit: Payer: Self-pay

## 2023-04-11 DIAGNOSIS — F32A Depression, unspecified: Secondary | ICD-10-CM

## 2023-04-11 DIAGNOSIS — K7460 Unspecified cirrhosis of liver: Secondary | ICD-10-CM

## 2023-04-11 MED ORDER — LAMOTRIGINE 150 MG PO TABS: 150.0000 mg | ORAL_TABLET | Freq: Every day | ORAL | 0 refills | Status: DC

## 2023-04-11 NOTE — Telephone Encounter (Signed)
Jacqueline Fowler called to request refill of her Lamictal.  The pharmacy told her it had been refused.  She has appt 6/10 but will run out before then.  Please send refill to [id number] - , [place] - [address]

## 2023-04-11 NOTE — Telephone Encounter (Signed)
Request refused because it was too early to RF.  New Rx sent.

## 2023-04-14 NOTE — Telephone Encounter (Signed)
For question regarding ibuprofen/Tylenol -Avoid both medications if possible -If you have to take pain medicines, prefer Tylenol (not more that 3/day).  Certainly no alcohol with Tylenol -Your platelet count is normal, if you have to use ibuprofen you can but only if needed and with meals.  Also for a short period of time  RG

## 2023-04-18 ENCOUNTER — Ambulatory Visit: Payer: BC Managed Care – PPO | Admitting: Psychology

## 2023-04-21 ENCOUNTER — Other Ambulatory Visit (INDEPENDENT_AMBULATORY_CARE_PROVIDER_SITE_OTHER): Payer: BC Managed Care – PPO

## 2023-04-21 ENCOUNTER — Ambulatory Visit: Payer: BC Managed Care – PPO | Admitting: Psychiatry

## 2023-04-21 ENCOUNTER — Encounter: Payer: Self-pay | Admitting: Psychiatry

## 2023-04-21 VITALS — BP 124/76 | HR 74

## 2023-04-21 DIAGNOSIS — F32A Depression, unspecified: Secondary | ICD-10-CM

## 2023-04-21 DIAGNOSIS — F419 Anxiety disorder, unspecified: Secondary | ICD-10-CM | POA: Diagnosis not present

## 2023-04-21 DIAGNOSIS — F411 Generalized anxiety disorder: Secondary | ICD-10-CM | POA: Diagnosis not present

## 2023-04-21 DIAGNOSIS — K7460 Unspecified cirrhosis of liver: Secondary | ICD-10-CM | POA: Diagnosis not present

## 2023-04-21 DIAGNOSIS — F4010 Social phobia, unspecified: Secondary | ICD-10-CM | POA: Diagnosis not present

## 2023-04-21 LAB — COMPREHENSIVE METABOLIC PANEL
ALT: 21 U/L (ref 0–35)
AST: 27 U/L (ref 0–37)
Albumin: 4.2 g/dL (ref 3.5–5.2)
Alkaline Phosphatase: 74 U/L (ref 39–117)
BUN: 18 mg/dL (ref 6–23)
CO2: 24 mEq/L (ref 19–32)
Calcium: 9 mg/dL (ref 8.4–10.5)
Chloride: 106 mEq/L (ref 96–112)
Creatinine, Ser: 0.73 mg/dL (ref 0.40–1.20)
GFR: 94.96 mL/min (ref >60.00)
Glucose, Bld: 95 mg/dL (ref 70–99)
Potassium: 4.3 mEq/L (ref 3.5–5.1)
Sodium: 137 mEq/L (ref 135–145)
Total Bilirubin: 0.4 mg/dL (ref 0.2–1.2)
Total Protein: 6.8 g/dL (ref 6.0–8.3)

## 2023-04-21 LAB — CBC
HCT: 42.8 % (ref 36.0–46.0)
Hemoglobin: 14 g/dL (ref 12.0–15.0)
MCHC: 32.7 g/dL (ref 30.0–36.0)
MCV: 85.9 fl (ref 78.0–100.0)
Platelets: 221 10*3/uL (ref 150.0–400.0)
RBC: 4.98 Mil/uL (ref 3.87–5.11)
RDW: 14.7 % (ref 11.5–15.5)
WBC: 5.4 10*3/uL (ref 4.0–10.5)

## 2023-04-21 MED ORDER — PROPRANOLOL HCL 10 MG PO TABS: ORAL_TABLET | ORAL | 1 refills | Status: DC

## 2023-04-21 MED ORDER — BREXPIPRAZOLE 2 MG PO TABS: 2.0000 mg | ORAL_TABLET | Freq: Every day | ORAL | 1 refills | Status: DC

## 2023-04-21 MED ORDER — BUSPIRONE HCL 30 MG PO TABS: 30.0000 mg | ORAL_TABLET | Freq: Two times a day (BID) | ORAL | 1 refills | Status: DC

## 2023-04-21 MED ORDER — LAMOTRIGINE 100 MG PO TABS: 100.0000 mg | ORAL_TABLET | Freq: Every day | ORAL | 0 refills | Status: DC

## 2023-04-21 MED ORDER — SERTRALINE HCL 100 MG PO TABS: 100.0000 mg | ORAL_TABLET | Freq: Two times a day (BID) | ORAL | 1 refills | Status: DC

## 2023-04-21 NOTE — Progress Notes (Signed)
Jacqueline Fowler [id number] [birth year] 52 y.o.  Subjective:   Patient ID:  Jacqueline Fowler is a 52 y.o. (DOB Sep 24, 1971) female.  Chief Complaint:  Chief Complaint  Patient presents with  . Other    Word finding difficulties and cognitive changes    HPI Jacqueline Fowler presents to the office today for follow-up of anxiety, depression, and insomnia.   She reports that she notices word finding difficulties and "will go blank" in the middle of talking. She reports some difficulty with ST memory.   She reports that at times she thinks she sees something in her peripheral vision and turns and nothing is there.  She reports some anxiety in response to medical issues and receiving conflicting information. She reports feeling "down about" cirrhosis diagnosis. Denies persistent depression. She reports "moments" of depression and negative self-talk. She is working on redirecting these negative thoughts. She reports having occasional acute anxiety and taking Lorazepam prn on rare occasions. She reports occasional panic symptoms without apparent trigger. She reports some worry about her health. She reports occasional avoidance when something causes her distress. She reports some all-or-nothing thinking, I.e "will give up" after making a poor choice or getting off track.She reports perfectionistic thinking. She reports that things have to be in balance. She reports feeling that people may be looking at her and critiquing her.  She reports social anxiety. "I feel like my life is passing me by and I am not living." She reports diminished interest in things. She reports that she will make goals for herself- " but don't put it in motion." Energy is "not great." She denies recent binge eating. She is now doing Weight Watchers. She reports difficulty with concentration. Denies SI. She reports having occ periods where she thinks others would be "better off" without her.   Has been out of work about 5 years.She reports  that 20 year friendship recently ended. She reports limited friendships.    Past Psychiatric Medication Trials: Valium Sertraline Prozac Wellbutrin-adverse reaction Rexulti Abilify- Weight gain Buspar Propranolol Ativan  AIMS    Flowsheet Row Office Visit from 12/26/2022 in [facility] Office Visit from 11/28/2021 in [facility] Office Visit from 07/31/2021 in [facility] Office Visit from 03/29/2021 in [facility]  AIMS Total Score 0 0 0 0      PHQ2-9    Flowsheet Row Office Visit from 08/14/2022 in [facility] Total Score 6  PHQ-9 Total Score 25        Review of Systems:  Review of Systems  Musculoskeletal:  Positive for back pain.  Neurological:  Positive for headaches. Negative for tremors.       She reports that she occasionally thinks she sees something in her peripheral vision. She reports that she has episodes of increased headaches.  Psychiatric/Behavioral:         Please refer to HPI    Medications: I have reviewed the patient's current medications.  Current Outpatient Medications  Medication Sig Dispense Refill  . acetaminophen (TYLENOL) 500 MG tablet Take 500 mg by mouth as needed.    . Cholecalciferol (VITAMIN D3 PO) Take by mouth.    . COLLAGEN PO Take by mouth.    . cyclobenzaprine (FLEXERIL) 5 MG tablet Take 5 mg by mouth 3 (three) times daily.    Marland Kitchen dicyclomine (BENTYL) 10 MG capsule TAKE 1 CAPSULE BY MOUTH 2  TO 3 TIMES A DAY 270 capsule 1  . famotidine (PEPCID) 20 MG tablet TAKE 1 TABLET BY MOUTH EVERYDAY AT BEDTIME 90 tablet 4  . hyoscyamine (LEVBID) 0.375 MG 12 hr tablet TAKE 1 TABLET BY MOUTH 2 TIMES DAILY. 180 tablet 1  . loperamide (IMODIUM) 2 MG capsule Take by mouth as needed for diarrhea or loose stools.    Marland Kitchen LORazepam (ATIVAN) 0.5 MG tablet Take 1 tablet (0.5 mg total) by mouth daily  as needed for anxiety (panic attack). 15 tablet 3  . Multiple Vitamins-Minerals (CENTRUM SILVER 50+WOMEN PO) Take by mouth.    . oxyCODONE-acetaminophen (PERCOCET/ROXICET) 5-325 MG tablet Take 1 tablet by mouth every 6 (six) hours as needed.    . pantoprazole (PROTONIX) 40 MG tablet Take 1 tablet (40 mg total) by mouth 2 (two) times daily. 90 tablet 3  . Pyridoxine HCl (VITAMIN B-6 PO) Take by mouth.    . brexpiprazole (REXULTI) 2 MG TABS tablet Take 1 tablet (2 mg total) by mouth daily. 90 tablet 1  . busPIRone (BUSPAR) 30 MG tablet Take 1 tablet (30 mg total) by mouth 2 (two) times daily. 180 tablet 1  . lamoTRIgine (LAMICTAL) 100 MG tablet Take 1 tablet (100 mg total) by mouth daily. 90 tablet 0  . propranolol (INDERAL) 10 MG tablet TAKE 1 TO 2 TABLETS BY MOUTH TWICE A DAY AS NEEDED FOR ANXIETY 360 tablet 1  . sertraline (ZOLOFT) 100 MG tablet Take 1 tablet (100 mg total) by mouth in the morning and at bedtime. 180 tablet 1   Current Facility-Administered Medications  Medication Dose Route Frequency Provider Last Rate Last Admin  . 0.9 %  sodium chloride infusion  500 mL Intravenous Once Lynann Bologna, MD        Medication Side Effects: Other: Possible cognitive side effects with lamictal  Allergies: No Known Allergies  Past Medical History:  Diagnosis Date  . Anxiety   . B12 deficiency   . Back pain   . Chest pain   . Cirrhosis of liver (HCC)   . Depression   . Fatty liver   . Gallbladder problem   . Gallstones 2004  . GERD (gastroesophageal reflux disease)   . History of stomach ulcers   . IBS (irritable bowel syndrome)   . Joint pain   . Obesity   . Pre-diabetes   . Swallowing difficulty   . UTI (urinary tract infection)   . Vitamin D deficiency     Past Medical History, Surgical history, Social history, and Family history were reviewed and updated as appropriate.   Please see review of systems for further details on the patient's review from today.   Objective:    Physical Exam:  BP 124/76   Pulse 74   LMP 05/15/2021 (Approximate)   Physical Exam Constitutional:      General: She is not in acute distress. Musculoskeletal:        General: No deformity.  Neurological:     Mental Status: She is alert and oriented to person, place, and time.     Coordination: Coordination normal.  Psychiatric:        Attention and Perception: Attention and perception normal. She does not perceive auditory or visual hallucinations.        Mood and Affect: Mood is anxious. Mood is not depressed. Affect is not labile, blunt, angry or inappropriate.        Speech: Speech normal.        Behavior: Behavior normal.  Thought Content: Thought content normal. Thought content is not paranoid or delusional. Thought content does not include homicidal or suicidal ideation. Thought content does not include homicidal or suicidal plan.        Cognition and Memory: Cognition and memory normal.        Judgment: Judgment normal.     Comments: Insight intact    Lab Review:     Component Value Date/Time   NA 137 04/21/2023 1020   NA 138 08/14/2022 1100   K 4.3 04/21/2023 1020   CL 106 04/21/2023 1020   CO2 24 04/21/2023 1020   GLUCOSE 95 04/21/2023 1020   BUN 18 04/21/2023 1020   BUN 15 08/14/2022 1100   CREATININE 0.73 04/21/2023 1020   CALCIUM 9.0 04/21/2023 1020   PROT 6.8 04/21/2023 1020   PROT 7.1 08/14/2022 1100   ALBUMIN 4.2 04/21/2023 1020   ALBUMIN 4.6 08/14/2022 1100   AST 27 04/21/2023 1020   ALT 21 04/21/2023 1020   ALKPHOS 74 04/21/2023 1020   BILITOT 0.4 04/21/2023 1020   BILITOT 0.5 08/14/2022 1100       Component Value Date/Time   WBC 5.4 04/21/2023 1020   RBC 4.98 04/21/2023 1020   HGB 14.0 04/21/2023 1020   HGB 14.0 08/14/2022 1100   HCT 42.8 04/21/2023 1020   HCT 42.7 08/14/2022 1100   PLT 221.0 04/21/2023 1020   PLT 217 08/14/2022 1100   MCV 85.9 04/21/2023 1020   MCV 85 08/14/2022 1100   MCH 27.8 08/14/2022 1100   MCHC 32.7  04/21/2023 1020   RDW 14.7 04/21/2023 1020   RDW 15.1 08/14/2022 1100   LYMPHSABS 1.6 10/17/2022 1455   LYMPHSABS 1.4 08/14/2022 1100   MONOABS 0.5 10/17/2022 1455   EOSABS 0.0 10/17/2022 1455   EOSABS 0.1 08/14/2022 1100   BASOSABS 0.1 10/17/2022 1455   BASOSABS 0.0 08/14/2022 1100    No results found for: "POCLITH", "LITHIUM"   No results found for: "PHENYTOIN", "PHENOBARB", "VALPROATE", "CBMZ"   .res Assessment: Plan:    Lower Lamictal Consider neuro consult  Avlynn was seen today for other.  Diagnoses and all orders for this visit:  GAD (generalized anxiety disorder) -     sertraline (ZOLOFT) 100 MG tablet; Take 1 tablet (100 mg total) by mouth in the morning and at bedtime. -     propranolol (INDERAL) 10 MG tablet; TAKE 1 TO 2 TABLETS BY MOUTH TWICE A DAY AS NEEDED FOR ANXIETY  Depression, unspecified depression type -     lamoTRIgine (LAMICTAL) 100 MG tablet; Take 1 tablet (100 mg total) by mouth daily. -     brexpiprazole (REXULTI) 2 MG TABS tablet; Take 1 tablet (2 mg total) by mouth daily. -     sertraline (ZOLOFT) 100 MG tablet; Take 1 tablet (100 mg total) by mouth in the morning and at bedtime.  Social anxiety disorder -     sertraline (ZOLOFT) 100 MG tablet; Take 1 tablet (100 mg total) by mouth in the morning and at bedtime. -     propranolol (INDERAL) 10 MG tablet; TAKE 1 TO 2 TABLETS BY MOUTH TWICE A DAY AS NEEDED FOR ANXIETY  Anxiety disorder, unspecified type -     busPIRone (BUSPAR) 30 MG tablet; Take 1 tablet (30 mg total) by mouth 2 (two) times daily.     Please see After Visit Summary for patient specific instructions.  Future Appointments  Date Time Provider Department Center  05/02/2023 10:00 AM Cottle, Lynnell Dike, LCSW  [facility] None  05/30/2023 10:00 AM Cottle, Bambi G, LCSW [facility] None  06/13/2023 10:00 AM Cottle, Bambi G, LCSW [facility] None  07/11/2023 10:00 AM Cottle, Bambi G, LCSW [facility] None    No orders of the defined types were placed  in this encounter.   -------------------------------

## 2023-04-22 LAB — AFP TUMOR MARKER: AFP-Tumor Marker: 3.8 ng/mL

## 2023-04-23 ENCOUNTER — Encounter: Payer: Self-pay | Admitting: Gastroenterology

## 2023-04-23 DIAGNOSIS — Z01419 Encounter for gynecological examination (general) (routine) without abnormal findings: Secondary | ICD-10-CM | POA: Diagnosis not present

## 2023-04-25 ENCOUNTER — Other Ambulatory Visit: Payer: Self-pay

## 2023-04-25 DIAGNOSIS — K7460 Unspecified cirrhosis of liver: Secondary | ICD-10-CM

## 2023-04-25 NOTE — Telephone Encounter (Signed)
MELD 3.0: 7 at 10/17/2022  2:55 PM MELD-Na: 6 at 10/17/2022  2:55 PM Calculated from: Serum Creatinine: 0.77 mg/dL (Using min of 1 mg/dL) at 16/11/958  4:54 PM Serum Sodium: 137 mEq/L at 10/17/2022  2:55 PM Total Bilirubin: 0.5 mg/dL (Using min of 1 mg/dL) at 07/19/1190  4:78 PM Serum Albumin: 4.4 g/dL (Using max of 3.5 g/dL) at 29/03/6212  0:86 PM INR(ratio): 1.0 ratio at 10/17/2022  2:55 PM Age at listing (hypothetical): 51 years Sex: Female at 10/17/2022  2:55 PM  Please order CBC, CMP, INR. Then will get the latest MELD score.

## 2023-04-25 NOTE — Telephone Encounter (Signed)
Just INR pl RG

## 2023-05-01 DIAGNOSIS — R5383 Other fatigue: Secondary | ICD-10-CM | POA: Diagnosis not present

## 2023-05-01 DIAGNOSIS — M150 Primary generalized (osteo)arthritis: Secondary | ICD-10-CM | POA: Diagnosis not present

## 2023-05-01 DIAGNOSIS — Z0000 Encounter for general adult medical examination without abnormal findings: Secondary | ICD-10-CM | POA: Diagnosis not present

## 2023-05-01 DIAGNOSIS — I10 Essential (primary) hypertension: Secondary | ICD-10-CM | POA: Diagnosis not present

## 2023-05-01 DIAGNOSIS — K219 Gastro-esophageal reflux disease without esophagitis: Secondary | ICD-10-CM | POA: Diagnosis not present

## 2023-05-02 ENCOUNTER — Ambulatory Visit: Payer: BC Managed Care – PPO | Admitting: Psychology

## 2023-05-02 DIAGNOSIS — F411 Generalized anxiety disorder: Secondary | ICD-10-CM | POA: Diagnosis not present

## 2023-05-02 DIAGNOSIS — F32A Depression, unspecified: Secondary | ICD-10-CM | POA: Diagnosis not present

## 2023-05-02 NOTE — Progress Notes (Signed)
Behavioral Health Counselor/Therapist Progress Note  Patient ID: Jacqueline Fowler, MRN: [id number],    Date: 05/02/2023  Time Spent: 60 minutes  Treatment Type: Individual Therapy  Reported Symptoms: depression  Mental Status Exam: Appearance:  Casual     Behavior: Appropriate  Motor: Normal  Speech/Language:  Normal Rate  Affect: Blunt  Mood: sad  Thought process: normal  Thought content:   WNL  Sensory/Perceptual disturbances:   WNL  Orientation: oriented to person, place, time/date, and situation  Attention: Good  Concentration: Good  Memory: WNL  Fund of knowledge:  Good  Insight:   Good  Judgment:  Good  Impulse Control: Good   Risk Assessment: Danger to Self:  No Self-injurious Behavior: No Danger to Others: No Duty to Warn:no Physical Aggression / Violence:No  Access to Firearms a concern: No  Gang Involvement:No   Subjective: The patient attended a face-to-face individual therapy session in the office today.  The patient presents as pleasant and cooperative.  The patient reports that she actually feels happy today.  We talked about what she is doing and she is started doing weight watchers and feels good about that.  She also states that she has been using the technique that I taught her the last time she was here and that has been helpful for her not to go into the negative emotions as much.  She talked about having an insight about her father teaching her how threatening to hurt himself is a bit of a manipulation.  She reports that she realized that she does not want to do that anymore because she does not like it when he does it.  We talked about that in relation to the EMDR that we did and I explained to her that she had that insight previously however it seems like it solidified more after having conversations with her father.  Use cognitive behavioral therapy in the session today.  Interventions: Cognitive Behavioral Therapy, EMDR  Diagnosis:GAD  (generalized anxiety disorder)  Depression, unspecified depression type  Plan: Client Abilities/Strengths  Intelligent, motivated, insightful  Client Treatment Preferences  Outpatient Individual therapy  Client Statement of Needs  "I need some help with my depression"  Treatment Level  Outpatient Individual therapy  Symptoms  Depressed or irritable mood.:  (Status: Improved). Feelings of hopelessness,  worthlessness, or inappropriate guilt.: (Status: maintained). History of chronic  or recurrent depression for which the client has taken antidepressant medication, been hospitalized, had outpatient treatment, or had a course of electroconvulsive therapy.: (Status:  maintained). Lack of energy.:  (Status: maintained). Low self-esteem.:  (Status: maintained). Poor concentration and indecisiveness.: (Status: maintained). Sleeplessness or hypersomnia.: (Status:  maintained). Social withdrawal.: (Status: maintained).  Problems Addressed  Unipolar Depression, Unipolar Depression    Goals 1. Develop healthy interpersonal relationships that lead to the alleviation  and help prevent the relapse of depression. 2. Develop healthy thinking patterns and beliefs about self, others, and the world that lead to the alleviation and help prevent the relapse of  depression. Objective Verbalize an understanding of healthy and unhealthy emotions with the intent of increasing the use of  healthy emotions to guide actions. Target Date: 2024-05-01 Frequency: biWeekly Progress: : individual Objective Learn and implement behavioral strategies to overcome depression. Target Date: 2024-05-01 Frequency: biWeekly Progress: 40 Modality: individual Related Interventions 1. Assist the client in developing skills that increase the likelihood of deriving pleasure from  behavioral activation (e.g., assertiveness skills, developing an exercise plan, less internal/more  external focus, increased  social  involvement); reinforce success. Objective Describe current and past experiences with depression including their impact on functioning and  attempts to resolve it. Target Date: 2024-05-01 Frequency: biWeekly Progress: 40 Modality: individual Related Interventions 1. Encourage the client to share his/her thoughts and feelings of depression; express empathy and  build rapport while identifying primary cognitive, behavioral, interpersonal, or other  contributors to depression.  Objective Learn and implement problem-solving and decision-making skills. Target Date: 2024-05-01 Frequency: biWeekly Progress: 40 Modality: individual Related Interventions 1. Encourage in the client the development of a positive problem orientation in which problems  and solving them are viewed as a natural part of life and not something to be feared, despaired,  or avoided. Objective Identify and replace thoughts and beliefs that support depression. Target Date: 2024-05-01 Frequency: biWeekly Progress: 10 Modality: individual Related Interventions 1. Conduct Cognitive-Behavioral Therapy (see Cognitive Behavior Therapy by Reola Calkins; Overcoming Depression by Agapito Games al.), beginning with helping the client learn the connection among  cognition, depressive feelings, and actions. 2. Facilitate and reinforce the client's shift from biased depressive self-talk and beliefs to realitybased cognitive messages that enhance self-confidence and increase adaptive actions (see  "Positive Self-Talk" in the Adult Psychotherapy Homework Planner by Stephannie Li). Diagnosis Axis  none 296.32 (Major depressive affective disorder, recurrent episode, moderate) - Open -  [Signifier: n/a]  Conditions For Discharge Achievement of treatment goals and objectives Will continue to see the patient at least biweekly and work with her using CBT, Insight oriented approach and EMDR.  Patient approved the treatment plan.  Jaydin Jalomo G Janisse Ghan,  LCSW

## 2023-05-10 ENCOUNTER — Other Ambulatory Visit: Payer: Self-pay | Admitting: Gastroenterology

## 2023-05-10 DIAGNOSIS — K449 Diaphragmatic hernia without obstruction or gangrene: Secondary | ICD-10-CM

## 2023-05-14 ENCOUNTER — Ambulatory Visit (INDEPENDENT_AMBULATORY_CARE_PROVIDER_SITE_OTHER): Payer: BC Managed Care – PPO | Admitting: Psychology

## 2023-05-14 DIAGNOSIS — F411 Generalized anxiety disorder: Secondary | ICD-10-CM | POA: Diagnosis not present

## 2023-05-14 DIAGNOSIS — F32A Depression, unspecified: Secondary | ICD-10-CM

## 2023-05-14 NOTE — Progress Notes (Addendum)
[facility] Counselor/Therapist Progress Note  Patient ID: Jacqueline Fowler, MRN: [id number],    Date: 05/14/2023  Time Spent: 60 minutes  Time in: 9:00  Time out:10:00  Treatment Type: Individual Therapy  Reported Symptoms: depression  Mental Status Exam: Appearance:  Casual     Behavior: Appropriate  Motor: Normal  Speech/Language:  Normal Rate  Affect: Blunt  Mood: pleasant  Thought process: normal  Thought content:   WNL  Sensory/Perceptual disturbances:   WNL  Orientation: oriented to person, place, time/date, and situation  Attention: Good  Concentration: Good  Memory: WNL  Fund of knowledge:  Good  Insight:   Good  Judgment:  Good  Impulse Control: Good   Risk Assessment: Danger to Self:  No Self-injurious Behavior: No Danger to Others: No Duty to Warn:no Physical Aggression / Violence:No  Access to Firearms a concern: No  Gang Involvement:No   Subjective: The patient attended a face-to-face individual therapy session via video visit.  The patient gave consent for the session to be on caregility.  The patient is aware of the limitations of telehealth.  The patient was in her home and therapist was in the office.The patient presents as pleasant and cooperative.  The patient states that her stepfather is in the hospital and struggling and her mother is having a difficult time because of the stress that she is under.  The patient did a really nice thing and did a fundraiser for her parents.  We talked about an interaction that she had with her husband this past week where he basically told her that she makes people uncomfortable.  He was saying this because he was uncomfortable with her emotions that she was having.  We talked about this and that it is okay to have the emotions that she has and it is okay for him to be uncomfortable.  What is not okay is her taking it on as she is the one with the problem.  She did report that it took her a few days but she  was able to figure out that it was his problem and not her problem.  This is a gross step for her and we want to continue to work with her on how to feel more secure with herself and her emotions and be able to not take on other people's criticisms. Interventions: Cognitive Behavioral Therapy, EMDR  Diagnosis:GAD (generalized anxiety disorder)  Depression, unspecified depression type  Plan: Client Abilities/Strengths  Intelligent, motivated, insightful  Client Treatment Preferences  Outpatient Individual therapy  Client Statement of Needs  "I need some help with my depression"  Treatment Level  Outpatient Individual therapy  Symptoms  Depressed or irritable mood.:  (Status: Improved). Feelings of hopelessness,  worthlessness, or inappropriate guilt.: (Status: maintained). History of chronic  or recurrent depression for which the client has taken antidepressant medication, been hospitalized, had outpatient treatment, or had a course of electroconvulsive therapy.: (Status:  maintained). Lack of energy.:  (Status: maintained). Low self-esteem.:  (Status: maintained). Poor concentration and indecisiveness.: (Status: maintained). Sleeplessness or hypersomnia.: (Status:  maintained). Social withdrawal.: (Status: maintained).  Problems Addressed  Unipolar Depression, Unipolar Depression    Goals 1. Develop healthy interpersonal relationships that lead to the alleviation  and help prevent the relapse of depression. 2. Develop healthy thinking patterns and beliefs about self, others, and the world that lead to the alleviation and help prevent the relapse of  depression. Objective Verbalize an understanding of healthy and unhealthy emotions with the  intent of increasing the use of  healthy emotions to guide actions. Target Date: 2024-05-01 Frequency: biWeekly Progress: : individual Objective Learn and implement behavioral strategies to overcome depression. Target Date:  2024-05-01 Frequency: biWeekly Progress: 40 Modality: individual Related Interventions 1. Assist the client in developing skills that increase the likelihood of deriving pleasure from  behavioral activation (e.g., assertiveness skills, developing an exercise plan, less internal/more  external focus, increased social involvement); reinforce success. Objective Describe current and past experiences with depression including their impact on functioning and  attempts to resolve it. Target Date: 2024-05-01 Frequency: biWeekly Progress: 40 Modality: individual Related Interventions 1. Encourage the client to share his/her thoughts and feelings of depression; express empathy and  build rapport while identifying primary cognitive, behavioral, interpersonal, or other  contributors to depression.  Objective Learn and implement problem-solving and decision-making skills. Target Date: 2024-05-01 Frequency: biWeekly Progress: 40 Modality: individual Related Interventions 1. Encourage in the client the development of a positive problem orientation in which problems  and solving them are viewed as a natural part of life and not something to be feared, despaired,  or avoided. Objective Identify and replace thoughts and beliefs that support depression. Target Date: 2024-05-01 Frequency: biWeekly Progress: 10 Modality: individual Related Interventions 1. Conduct Cognitive-Behavioral Therapy (see Cognitive Behavior Therapy by Reola Calkins; Overcoming Depression by Agapito Games al.), beginning with helping the client learn the connection among  cognition, depressive feelings, and actions. 2. Facilitate and reinforce the client's shift from biased depressive self-talk and beliefs to realitybased cognitive messages that enhance self-confidence and increase adaptive actions (see  "Positive Self-Talk" in the Adult Psychotherapy Homework Planner by Stephannie Li). Diagnosis Axis  none 296.32 (Major depressive affective  disorder, recurrent episode, moderate) - Open -  [Signifier: n/a]  Conditions For Discharge Achievement of treatment goals and objectives Will continue to see the patient at least biweekly and work with her using CBT, Insight oriented approach and EMDR.  Patient approved the treatment plan.  Lakeesha Fontanilla G Laure Leone, LCSW

## 2023-05-30 ENCOUNTER — Ambulatory Visit (INDEPENDENT_AMBULATORY_CARE_PROVIDER_SITE_OTHER): Payer: BC Managed Care – PPO | Admitting: Psychology

## 2023-05-30 DIAGNOSIS — F32A Depression, unspecified: Secondary | ICD-10-CM

## 2023-05-30 DIAGNOSIS — F411 Generalized anxiety disorder: Secondary | ICD-10-CM

## 2023-05-30 NOTE — Progress Notes (Signed)
[facility] Counselor/Therapist Progress Note  Patient ID: Jacqueline Fowler, MRN: [id number],    Date: 05/30/2023  Time Spent: 60 minutes  Time in: 10:00  Time out:11:07  Treatment Type: Individual Therapy  Reported Symptoms: depression  Mental Status Exam: Appearance:  Casual     Behavior: Appropriate  Motor: Normal  Speech/Language:  Normal Rate  Affect: Blunt  Mood: pleasant  Thought process: normal  Thought content:   WNL  Sensory/Perceptual disturbances:   WNL  Orientation: oriented to person, place, time/date, and situation  Attention: Good  Concentration: Good  Memory: WNL  Fund of knowledge:  Good  Insight:   Good  Judgment:  Good  Impulse Control: Good   Risk Assessment: Danger to Self:  No Self-injurious Behavior: No Danger to Others: No Duty to Warn:no Physical Aggression / Violence:No  Access to Firearms a concern: No  Gang Involvement:No   Subjective: The patient attended a face-to-face individual therapy session with his today.  The patient presents with a blunted affect and her mood is pleasant.  The patient states that she is trying to be supportive of her mother.  They have had a very strained relationship but it seems that she is helping her mother because her mother's husband recently had brain surgery and has not been doing very well.  We talked about resources that her mother should be able to tap into and I explained to her some ways to suggest that her mother get some more help because her mother is elderly and her stepfather is demented at present.  The patient also brought up a situation with her son and his girlfriend and we talked briefly about that.  We spent the majority of the session trying to talk about the situation with her mother that was bothering her.  Interventions: Cognitive Behavioral Therapy, EMDR, problem solving  Diagnosis:GAD (generalized anxiety disorder)  Depression, unspecified depression type  Plan: Client  Abilities/Strengths  Intelligent, motivated, insightful  Client Treatment Preferences  Outpatient Individual therapy  Client Statement of Needs  "I need some help with my depression"  Treatment Level  Outpatient Individual therapy  Symptoms  Depressed or irritable mood.:  (Status: Improved). Feelings of hopelessness,  worthlessness, or inappropriate guilt.: (Status: maintained). History of chronic  or recurrent depression for which the client has taken antidepressant medication, been hospitalized, had outpatient treatment, or had a course of electroconvulsive therapy.: (Status:  maintained). Lack of energy.:  (Status: maintained). Low self-esteem.:  (Status: maintained). Poor concentration and indecisiveness.: (Status: maintained). Sleeplessness or hypersomnia.: (Status:  maintained). Social withdrawal.: (Status: maintained).  Problems Addressed  Unipolar Depression, Unipolar Depression    Goals 1. Develop healthy interpersonal relationships that lead to the alleviation  and help prevent the relapse of depression. 2. Develop healthy thinking patterns and beliefs about self, others, and the world that lead to the alleviation and help prevent the relapse of  depression. Objective Verbalize an understanding of healthy and unhealthy emotions with the intent of increasing the use of  healthy emotions to guide actions. Target Date: 2024-05-01 Frequency: biWeekly Progress: : individual Objective Learn and implement behavioral strategies to overcome depression. Target Date: 2024-05-01 Frequency: biWeekly Progress: 40 Modality: individual Related Interventions 1. Assist the client in developing skills that increase the likelihood of deriving pleasure from  behavioral activation (e.g., assertiveness skills, developing an exercise plan, less internal/more  external focus, increased social involvement); reinforce success. Objective Describe current and past experiences with  depression including their impact on functioning and  attempts  to resolve it. Target Date: 2024-05-01 Frequency: biWeekly Progress: 40 Modality: individual Related Interventions 1. Encourage the client to share his/her thoughts and feelings of depression; express empathy and  build rapport while identifying primary cognitive, behavioral, interpersonal, or other  contributors to depression.  Objective Learn and implement problem-solving and decision-making skills. Target Date: 2024-05-01 Frequency: biWeekly Progress: 40 Modality: individual Related Interventions 1. Encourage in the client the development of a positive problem orientation in which problems  and solving them are viewed as a natural part of life and not something to be feared, despaired,  or avoided. Objective Identify and replace thoughts and beliefs that support depression. Target Date: 2024-05-01 Frequency: biWeekly Progress: 10 Modality: individual Related Interventions 1. Conduct Cognitive-Behavioral Therapy (see Cognitive Behavior Therapy by Reola Calkins; Overcoming Depression by Agapito Games al.), beginning with helping the client learn the connection among  cognition, depressive feelings, and actions. 2. Facilitate and reinforce the client's shift from biased depressive self-talk and beliefs to realitybased cognitive messages that enhance self-confidence and increase adaptive actions (see  "Positive Self-Talk" in the Adult Psychotherapy Homework Planner by Stephannie Li). Diagnosis Axis  none 296.32 (Major depressive affective disorder, recurrent episode, moderate) - Open -  [Signifier: n/a]  Conditions For Discharge Achievement of treatment goals and objectives Will continue to see the patient at least biweekly and work with her using CBT, Insight oriented approach and EMDR.  Patient approved the treatment plan.  Jery Hollern G Jovee Dettinger,  LCSW

## 2023-06-03 ENCOUNTER — Other Ambulatory Visit: Payer: Self-pay | Admitting: Gastroenterology

## 2023-06-03 DIAGNOSIS — R197 Diarrhea, unspecified: Secondary | ICD-10-CM

## 2023-06-13 ENCOUNTER — Ambulatory Visit: Payer: BC Managed Care – PPO | Admitting: Psychology

## 2023-06-13 DIAGNOSIS — F32A Depression, unspecified: Secondary | ICD-10-CM | POA: Diagnosis not present

## 2023-06-13 DIAGNOSIS — F411 Generalized anxiety disorder: Secondary | ICD-10-CM | POA: Diagnosis not present

## 2023-06-13 NOTE — Progress Notes (Unsigned)
                Aidan Moten G Hannalee Castor, LCSW 

## 2023-06-20 ENCOUNTER — Telehealth: Payer: Self-pay | Admitting: Psychiatry

## 2023-06-20 NOTE — Telephone Encounter (Signed)
Patient reported crying spells, irritability, feeling like she has no control over how she is feeling for about the past 2 weeks. Asked if there were any new stressors and she reported her stepdad had surgery for a brain tumor and she is in a caregiver role with him and her mom. There is job stress and her son is leaving for college. She reported taking all her medications as prescribed.

## 2023-06-20 NOTE — Telephone Encounter (Signed)
Please call to offer earlier appointment.

## 2023-06-20 NOTE — Telephone Encounter (Signed)
Pt lvm that she needs to speak to [place]. Her phone number is [phone]

## 2023-06-21 ENCOUNTER — Telehealth: Payer: Self-pay | Admitting: Psychiatry

## 2023-06-21 DIAGNOSIS — F410 Panic disorder [episodic paroxysmal anxiety] without agoraphobia: Secondary | ICD-10-CM

## 2023-06-21 MED ORDER — CLONAZEPAM 1 MG PO TABS: ORAL_TABLET | ORAL | 0 refills | Status: AC

## 2023-06-21 NOTE — Telephone Encounter (Signed)
Received after hours call. She reports, "I feel like my chest is caving in... super emotional." She reports that she has been crying uncontrollably at times.   She reports that severe anxiety started later yesterday and has continued since then. She also had a severe panic attack one week ago. She reports, "I just feel overwhelmed." Step-father had a brain mass and she has been helping her mother take care of him. Youngest son is moving out to go to college. She reports that this is also the anniversary month of her son's death.  She reports that she has been taking Lorazepam twice daily and tried taking two tabs recently with limited improvement.   Will start Klonopin 1 mg 1/2-1 tab po BID prn panic since Lorazepam prn has been ineffective. Advised pt to take Klonopin BID until severe panic subsides. Patient advised to contact office with any questions, adverse effects, or acute worsening in signs and symptoms.

## 2023-06-27 ENCOUNTER — Ambulatory Visit: Payer: BC Managed Care – PPO | Admitting: Psychology

## 2023-06-27 DIAGNOSIS — F411 Generalized anxiety disorder: Secondary | ICD-10-CM

## 2023-06-27 DIAGNOSIS — F32A Depression, unspecified: Secondary | ICD-10-CM

## 2023-06-27 DIAGNOSIS — F410 Panic disorder [episodic paroxysmal anxiety] without agoraphobia: Secondary | ICD-10-CM | POA: Diagnosis not present

## 2023-06-27 NOTE — Progress Notes (Signed)
[facility] Counselor/Therapist Progress Note  Patient ID: Jacqueline Fowler, MRN: [id number],    Date: 06/27/2023  Time Spent: 60 minutes  Time in: 10:00  Time out:11:02  Treatment Type: Individual Therapy  Reported Symptoms: depression  Mental Status Exam: Appearance:  Casual     Behavior: Appropriate  Motor: Normal  Speech/Language:  Normal Rate  Affect: Blunt  Mood: pleasant  Thought process: normal  Thought content:   WNL  Sensory/Perceptual disturbances:   WNL  Orientation: oriented to person, place, time/date, and situation  Attention: Good  Concentration: Good  Memory: WNL  Fund of knowledge:  Good  Insight:   Good  Judgment:  Good  Impulse Control: Good   Risk Assessment: Danger to Self:  No Self-injurious Behavior: No Danger to Others: No Duty to Warn:no Physical Aggression / Violence:No  Access to Firearms a concern: No  Gang Involvement:No   Subjective: The patient attended a face-to-face individual therapy session with his today.  The patient presents with a blunted affect and her mood is pleasant.  The patient reported that she had a meltdown over the last 2 weeks.  We talked about what happened and it seems that maybe she just needed to get some of her frustrations out from all these years of carrying around depression and sadness.  We talked about some interactions that she had with her son and his girlfriend and it seems that she is still having a bit of an issue with having expectations that other people are going to be like her and do what she would normally do in situations.  I tried to explain to her that people do not all people have the same perceptions.  The patient tends to get stuck on having expectations that are unrealistic.  We may need to do some more EMDR moving forward to try to impact this.  Interventions: Cognitive Behavioral Therapy, EMDR, problem solving  Diagnosis:Panic disorder  GAD (generalized anxiety  disorder)  Depression, unspecified depression type  Plan: Client Abilities/Strengths  Intelligent, motivated, insightful  Client Treatment Preferences  Outpatient Individual therapy  Client Statement of Needs  "I need some help with my depression"  Treatment Level  Outpatient Individual therapy  Symptoms  Depressed or irritable mood.:  (Status: Improved). Feelings of hopelessness,  worthlessness, or inappropriate guilt.: (Status: maintained). History of chronic  or recurrent depression for which the client has taken antidepressant medication, been hospitalized, had outpatient treatment, or had a course of electroconvulsive therapy.: (Status:  maintained). Lack of energy.:  (Status: maintained). Low self-esteem.:  (Status: maintained). Poor concentration and indecisiveness.: (Status: maintained). Sleeplessness or hypersomnia.: (Status:  maintained). Social withdrawal.: (Status: maintained).  Problems Addressed  Unipolar Depression, Unipolar Depression    Goals 1. Develop healthy interpersonal relationships that lead to the alleviation  and help prevent the relapse of depression. 2. Develop healthy thinking patterns and beliefs about self, others, and the world that lead to the alleviation and help prevent the relapse of  depression. Objective Verbalize an understanding of healthy and unhealthy emotions with the intent of increasing the use of  healthy emotions to guide actions. Target Date: 2024-05-01 Frequency: biWeekly Progress: : individual Objective Learn and implement behavioral strategies to overcome depression. Target Date: 2024-05-01 Frequency: biWeekly Progress: 40 Modality: individual Related Interventions 1. Assist the client in developing skills that increase the likelihood of deriving pleasure from  behavioral activation (e.g., assertiveness skills, developing an exercise plan, less internal/more  external focus, increased social involvement); reinforce  success. Objective Describe  current and past experiences with depression including their impact on functioning and  attempts to resolve it. Target Date: 2024-05-01 Frequency: biWeekly Progress: 40 Modality: individual Related Interventions 1. Encourage the client to share his/her thoughts and feelings of depression; express empathy and  build rapport while identifying primary cognitive, behavioral, interpersonal, or other  contributors to depression.  Objective Learn and implement problem-solving and decision-making skills. Target Date: 2024-05-01 Frequency: biWeekly Progress: 40 Modality: individual Related Interventions 1. Encourage in the client the development of a positive problem orientation in which problems  and solving them are viewed as a natural part of life and not something to be feared, despaired,  or avoided. Objective Identify and replace thoughts and beliefs that support depression. Target Date: 2024-05-01 Frequency: biWeekly Progress: 10 Modality: individual Related Interventions 1. Conduct Cognitive-Behavioral Therapy (see Cognitive Behavior Therapy by Reola Calkins; Overcoming Depression by Agapito Games al.), beginning with helping the client learn the connection among  cognition, depressive feelings, and actions. 2. Facilitate and reinforce the client's shift from biased depressive self-talk and beliefs to realitybased cognitive messages that enhance self-confidence and increase adaptive actions (see  "Positive Self-Talk" in the Adult Psychotherapy Homework Planner by Stephannie Li). Diagnosis Axis  none 296.32 (Major depressive affective disorder, recurrent episode, moderate) - Open -  [Signifier: n/a]  Conditions For Discharge Achievement of treatment goals and objectives Will continue to see the patient at least biweekly and work with her using CBT, Insight oriented approach and EMDR.  Patient approved the treatment plan.  Ohana Birdwell G Chucky Homes,  LCSW

## 2023-07-11 ENCOUNTER — Ambulatory Visit: Payer: BC Managed Care – PPO | Admitting: Psychology

## 2023-07-11 DIAGNOSIS — F411 Generalized anxiety disorder: Secondary | ICD-10-CM

## 2023-07-11 DIAGNOSIS — F331 Major depressive disorder, recurrent, moderate: Secondary | ICD-10-CM

## 2023-07-11 DIAGNOSIS — M1712 Unilateral primary osteoarthritis, left knee: Secondary | ICD-10-CM | POA: Diagnosis not present

## 2023-07-11 NOTE — Progress Notes (Signed)
[facility] Counselor/Therapist Progress Note  Patient ID: DICK BURDGE, MRN: [id number],    Date: 07/11/2023  Time Spent: 60 minutes  Time in: 10:00  Time out:11:00  Treatment Type: Individual Therapy  Reported Symptoms: depression  Mental Status Exam: Appearance:  Casual     Behavior: Appropriate  Motor: Normal  Speech/Language:  Normal Rate  Affect: Blunt  Mood: pleasant  Thought process: normal  Thought content:   WNL  Sensory/Perceptual disturbances:   WNL  Orientation: oriented to person, place, time/date, and situation  Attention: Good  Concentration: Good  Memory: WNL  Fund of knowledge:  Good  Insight:   Good  Judgment:  Good  Impulse Control: Good   Risk Assessment: Danger to Self:  No Self-injurious Behavior: No Danger to Others: No Duty to Warn:no Physical Aggression / Violence:No  Access to Firearms a concern: No  Gang Involvement:No   Subjective: The patient attended a face-to-face individual therapy session with his today.  The patient presents with a blunted affect and her mood is pleasant.  The patient talked today more about her sons and her feelings about what happened when they were young.  She talked about her son Weston Brass and the trauma that he experienced when his brother passed away.  Her son Weston Brass blames her for his problems and we talked about this as being his issue not hers.  She seems to have an understanding of that and she seems to be moving past it now.  She talked about her son Riki Rusk who passed away when he was 5 and her feelings about that.  We talked about her grief and that her son Weston Brass blames her and I explained to her that he obviously got stuck in the grief around the loss of his brother.  We talked about ways to handle the situation if he should come around and have a conversation with her again.  Apparently Weston Brass may have some similar depressive issues such as the patient.  The patient seems to be doing much better with  detaching herself and not taking things as personally from other people.  We will continue to work with her on moving through this and hopefully continue to do some EMDR in the future so that she can totally process and move forward. Interventions: Cognitive Behavioral Therapy, EMDR, problem solving  Diagnosis:GAD (generalized anxiety disorder)  Major depressive disorder, recurrent episode, moderate (HCC)  Plan: Client Abilities/Strengths  Intelligent, motivated, insightful  Client Treatment Preferences  Outpatient Individual therapy  Client Statement of Needs  "I need some help with my depression"  Treatment Level  Outpatient Individual therapy  Symptoms  Depressed or irritable mood.:  (Status: Improved). Feelings of hopelessness,  worthlessness, or inappropriate guilt.: (Status: maintained). History of chronic  or recurrent depression for which the client has taken antidepressant medication, been hospitalized, had outpatient treatment, or had a course of electroconvulsive therapy.: (Status:  maintained). Lack of energy.:  (Status: maintained). Low self-esteem.:  (Status: maintained). Poor concentration and indecisiveness.: (Status: maintained). Sleeplessness or hypersomnia.: (Status:  maintained). Social withdrawal.: (Status: maintained).  Problems Addressed  Unipolar Depression, Unipolar Depression    Goals 1. Develop healthy interpersonal relationships that lead to the alleviation  and help prevent the relapse of depression. 2. Develop healthy thinking patterns and beliefs about self, others, and the world that lead to the alleviation and help prevent the relapse of  depression. Objective Verbalize an understanding of healthy and unhealthy emotions with the intent of increasing the use of  healthy emotions to guide actions. Target Date: 2024-05-01 Frequency: biWeekly Progress: : individual Objective Learn and implement behavioral strategies to overcome  depression. Target Date: 2024-05-01 Frequency: biWeekly Progress: 40 Modality: individual Related Interventions 1. Assist the client in developing skills that increase the likelihood of deriving pleasure from  behavioral activation (e.g., assertiveness skills, developing an exercise plan, less internal/more  external focus, increased social involvement); reinforce success. Objective Describe current and past experiences with depression including their impact on functioning and  attempts to resolve it. Target Date: 2024-05-01 Frequency: biWeekly Progress: 40 Modality: individual Related Interventions 1. Encourage the client to share his/her thoughts and feelings of depression; express empathy and  build rapport while identifying primary cognitive, behavioral, interpersonal, or other  contributors to depression.  Objective Learn and implement problem-solving and decision-making skills. Target Date: 2024-05-01 Frequency: biWeekly Progress: 40 Modality: individual Related Interventions 1. Encourage in the client the development of a positive problem orientation in which problems  and solving them are viewed as a natural part of life and not something to be feared, despaired,  or avoided. Objective Identify and replace thoughts and beliefs that support depression. Target Date: 2024-05-01 Frequency: biWeekly Progress: 10 Modality: individual Related Interventions 1. Conduct Cognitive-Behavioral Therapy (see Cognitive Behavior Therapy by Reola Calkins; Overcoming Depression by Agapito Games al.), beginning with helping the client learn the connection among  cognition, depressive feelings, and actions. 2. Facilitate and reinforce the client's shift from biased depressive self-talk and beliefs to realitybased cognitive messages that enhance self-confidence and increase adaptive actions (see  "Positive Self-Talk" in the Adult Psychotherapy Homework Planner by Stephannie Li). Diagnosis Axis  none 296.32  (Major depressive affective disorder, recurrent episode, moderate) - Open -  [Signifier: n/a]  Conditions For Discharge Achievement of treatment goals and objectives Will continue to see the patient at least biweekly and work with her using CBT, Insight oriented approach and EMDR.  Patient approved the treatment plan.  Eve Rey G Orvile Corona, LCSW

## 2023-07-17 ENCOUNTER — Other Ambulatory Visit: Payer: Self-pay | Admitting: Psychiatry

## 2023-07-17 DIAGNOSIS — M7918 Myalgia, other site: Secondary | ICD-10-CM | POA: Diagnosis not present

## 2023-07-17 DIAGNOSIS — M47816 Spondylosis without myelopathy or radiculopathy, lumbar region: Secondary | ICD-10-CM | POA: Diagnosis not present

## 2023-07-17 DIAGNOSIS — F32A Depression, unspecified: Secondary | ICD-10-CM

## 2023-07-18 ENCOUNTER — Telehealth: Payer: Self-pay | Admitting: Psychiatry

## 2023-07-18 NOTE — Telephone Encounter (Deleted)
Next visit is 07/22/23. Jacqueline Fowler's orthopaedic surgeon has her on two medications either Amitriptyline or Nortriptyline. We have her on Zoloft. Doctor wants to know if these two would interfere with her Zoloft? Pharmacy is:   [id number] - [place], [place] - [address]    Phone: [phone]  Fax: [phone]      She states she needs a refill on the Zoloft.

## 2023-07-18 NOTE — Telephone Encounter (Signed)
Next visit is 07/22/23. Jacqueline Fowler's orthopaedic surgeon has her on two medications either Amitriptyline or Nortriptyline. We have her on Zoloft. Doctor wants to know if these two would interfere with her Zoloft? Pharmacy is:   [id number] - [place], [place] - [address]    Phone: [phone]  Fax: [phone]      She states she needs a refill on the Zoloft.

## 2023-07-22 ENCOUNTER — Ambulatory Visit: Payer: BC Managed Care – PPO | Admitting: Psychiatry

## 2023-07-22 ENCOUNTER — Encounter: Payer: Self-pay | Admitting: Psychiatry

## 2023-07-22 DIAGNOSIS — F419 Anxiety disorder, unspecified: Secondary | ICD-10-CM | POA: Diagnosis not present

## 2023-07-22 DIAGNOSIS — F32A Depression, unspecified: Secondary | ICD-10-CM

## 2023-07-22 DIAGNOSIS — F411 Generalized anxiety disorder: Secondary | ICD-10-CM | POA: Diagnosis not present

## 2023-07-22 DIAGNOSIS — F4010 Social phobia, unspecified: Secondary | ICD-10-CM | POA: Diagnosis not present

## 2023-07-22 MED ORDER — SERTRALINE HCL 100 MG PO TABS: 100.0000 mg | ORAL_TABLET | Freq: Two times a day (BID) | ORAL | 1 refills | Status: AC

## 2023-07-22 MED ORDER — LAMOTRIGINE 100 MG PO TABS: 100.0000 mg | ORAL_TABLET | Freq: Every day | ORAL | 1 refills | Status: AC

## 2023-07-22 MED ORDER — BREXPIPRAZOLE 2 MG PO TABS: 2.0000 mg | ORAL_TABLET | Freq: Every day | ORAL | 1 refills | Status: AC

## 2023-07-22 MED ORDER — BUSPIRONE HCL 30 MG PO TABS: 30.0000 mg | ORAL_TABLET | Freq: Two times a day (BID) | ORAL | 1 refills | Status: AC

## 2023-07-22 NOTE — Telephone Encounter (Signed)
Called patient and she has an appt with Shanda Bumps today.

## 2023-07-22 NOTE — Progress Notes (Signed)
Jacqueline Fowler [id number] 1971/05/10 52 y.o.  Subjective:   Patient ID:  Jacqueline Fowler is a 52 y.o. (DOB 1971/08/03) female.  Chief Complaint:  Chief Complaint  Patient presents with   Follow-up    Depression, anxiety    HPI Jacqueline Fowler presents to the office today for follow-up of anxiety and depression. Jacqueline Fowler reports that her anxiety has been "fine." Jacqueline Fowler reports that Klonopin prn was helpful for her anxiety. Jacqueline Fowler reports that Jacqueline Fowler has only used klonopin prn 3-4 times. Jacqueline Fowler reports that Jacqueline Fowler has used it to treat panic attacks. Jacqueline Fowler reports, "the cirrhosis is what bothers me the most." Jacqueline Fowler reports concern about weight and if this would affect her ability to receive a liver transplant in the future. Jacqueline Fowler reports that Jacqueline Fowler started Weight Watchers. Jacqueline Fowler reports that Jacqueline Fowler has lost 15 lbs intentionally. Jacqueline Fowler reports that Jacqueline Fowler has cravings to eat foods that Jacqueline Fowler recognizes are unhealthy. Denies recent depression. Sleeping well. Sleep improved over the last few months. Jacqueline Fowler reports improved energy and motivation. Jacqueline Fowler notices Jacqueline Fowler has been engaged in more activities recently. "I've been participating in life a little more." Jacqueline Fowler reports that Jacqueline Fowler feels more comfortable. Jacqueline Fowler has been able to concentrate on webinar. Denies SI.   Jacqueline Fowler reports that orthopedic surgeon is asking about adding either low dose Nortriptyline or Amitriptyline.   Jacqueline Fowler reports that her son moving out was a difficult adjusting for her after being a mother for 36 years. Son did 2 years of community college and is now starting at [employer].   Past Psychiatric Medication Trials: Valium Sertraline Prozac Wellbutrin-adverse reaction Rexulti Abilify- Weight gain Buspar Propranolol Ativan  AIMS    Flowsheet Row Office Visit from 07/22/2023 in [facility] Office Visit from 12/26/2022 in [facility] Office Visit from 11/28/2021 in [facility] Office Visit from 07/31/2021  in [facility] Office Visit from 03/29/2021 in [facility]  AIMS Total Score 0 0 0 0 0      PHQ2-9    Flowsheet Row Office Visit from 08/14/2022 in [facility] Total Score 6  PHQ-9 Total Score 25        Review of Systems:  Review of Systems  Musculoskeletal:  Negative for gait problem.  Skin:        Jacqueline Fowler reports continued hair loss  Neurological:  Negative for tremors.  Psychiatric/Behavioral:         Please refer to HPI    Medications: I have reviewed the patient's current medications.  Current Outpatient Medications  Medication Sig Dispense Refill   phentermine (ADIPEX-P) 37.5 MG tablet Take 37.5 mg by mouth daily before breakfast.     acetaminophen (TYLENOL) 500 MG tablet Take 500 mg by mouth as needed.     brexpiprazole (REXULTI) 2 MG TABS tablet Take 1 tablet (2 mg total) by mouth daily. 90 tablet 1   busPIRone (BUSPAR) 30 MG tablet Take 1 tablet (30 mg total) by mouth 2 (two) times daily. 180 tablet 1   Cholecalciferol (VITAMIN D3 PO) Take by mouth.     clonazePAM (KLONOPIN) 1 MG tablet Take 1/2-1 tablet twice daily as needed for panic 30 tablet 0   COLLAGEN PO Take by mouth.     cyclobenzaprine (FLEXERIL) 5 MG tablet Take 5 mg by mouth 3 (three) times daily.     dicyclomine (BENTYL) 10 MG capsule TAKE 1  CAPSULE BY MOUTH 2 TO 3 TIMES A DAY 270 capsule 1   famotidine (PEPCID) 20 MG tablet TAKE 1 TABLET BY MOUTH EVERYDAY AT BEDTIME 90 tablet 0   hyoscyamine (LEVBID) 0.375 MG 12 hr tablet TAKE 1 TABLET BY MOUTH TWICE A DAY 180 tablet 1   lamoTRIgine (LAMICTAL) 100 MG tablet Take 1 tablet (100 mg total) by mouth daily. 90 tablet 1   loperamide (IMODIUM) 2 MG capsule Take by mouth as needed for diarrhea or loose stools.     Multiple Vitamins-Minerals (CENTRUM SILVER 50+WOMEN PO) Take by mouth.     oxyCODONE-acetaminophen (PERCOCET/ROXICET) 5-325 MG tablet Take 1 tablet by  mouth every 6 (six) hours as needed.     pantoprazole (PROTONIX) 40 MG tablet Take 1 tablet (40 mg total) by mouth 2 (two) times daily. 90 tablet 3   propranolol (INDERAL) 10 MG tablet TAKE 1 TO 2 TABLETS BY MOUTH TWICE A DAY AS NEEDED FOR ANXIETY (Patient taking differently: Take 10 mg by mouth 2 (two) times daily. TAKE 1 TO 2 TABLETS BY MOUTH TWICE A DAY AS NEEDED FOR ANXIETY) 360 tablet 1   Pyridoxine HCl (VITAMIN B-6 PO) Take by mouth.     sertraline (ZOLOFT) 100 MG tablet Take 1 tablet (100 mg total) by mouth in the morning and at bedtime. 180 tablet 1   Current Facility-Administered Medications  Medication Dose Route Frequency Provider Last Rate Last Admin   0.9 %  sodium chloride infusion  500 mL Intravenous Once Lynann Bologna, MD        Medication Side Effects: None  Allergies: No Known Allergies  Past Medical History:  Diagnosis Date   Anxiety    B12 deficiency    Back pain    Chest pain    Cirrhosis of liver (HCC)    Depression    Fatty liver    Gallbladder problem    Gallstones 2004   GERD (gastroesophageal reflux disease)    History of stomach ulcers    IBS (irritable bowel syndrome)    Joint pain    Obesity    Pre-diabetes    Swallowing difficulty    UTI (urinary tract infection)    Vitamin D deficiency     Past Medical History, Surgical history, Social history, and Family history were reviewed and updated as appropriate.   Please see review of systems for further details on the patient's review from today.   Objective:   Physical Exam:  LMP 05/15/2021 (Approximate)   Physical Exam Constitutional:      General: Jacqueline Fowler is not in acute distress. Musculoskeletal:        General: No deformity.  Neurological:     Mental Status: Jacqueline Fowler is alert and oriented to person, place, and time.     Coordination: Coordination normal.  Psychiatric:        Attention and Perception: Attention and perception normal. Jacqueline Fowler does not perceive auditory or visual hallucinations.         Mood and Affect: Mood is anxious. Mood is not depressed. Affect is not labile, blunt, angry or inappropriate.        Speech: Speech normal.        Behavior: Behavior normal.        Thought Content: Thought content normal. Thought content is not paranoid or delusional. Thought content does not include homicidal or suicidal ideation. Thought content does not include homicidal or suicidal plan.        Cognition and Memory: Cognition and memory normal.  Judgment: Judgment normal.     Comments: Insight intact     Lab Review:     Component Value Date/Time   NA 137 04/21/2023 1020   NA 138 08/14/2022 1100   K 4.3 04/21/2023 1020   CL 106 04/21/2023 1020   CO2 24 04/21/2023 1020   GLUCOSE 95 04/21/2023 1020   BUN 18 04/21/2023 1020   BUN 15 08/14/2022 1100   CREATININE 0.73 04/21/2023 1020   CALCIUM 9.0 04/21/2023 1020   PROT 6.8 04/21/2023 1020   PROT 7.1 08/14/2022 1100   ALBUMIN 4.2 04/21/2023 1020   ALBUMIN 4.6 08/14/2022 1100   AST 27 04/21/2023 1020   ALT 21 04/21/2023 1020   ALKPHOS 74 04/21/2023 1020   BILITOT 0.4 04/21/2023 1020   BILITOT 0.5 08/14/2022 1100       Component Value Date/Time   WBC 5.4 04/21/2023 1020   RBC 4.98 04/21/2023 1020   HGB 14.0 04/21/2023 1020   HGB 14.0 08/14/2022 1100   HCT 42.8 04/21/2023 1020   HCT 42.7 08/14/2022 1100   PLT 221.0 04/21/2023 1020   PLT 217 08/14/2022 1100   MCV 85.9 04/21/2023 1020   MCV 85 08/14/2022 1100   MCH 27.8 08/14/2022 1100   MCHC 32.7 04/21/2023 1020   RDW 14.7 04/21/2023 1020   RDW 15.1 08/14/2022 1100   LYMPHSABS 1.6 10/17/2022 1455   LYMPHSABS 1.4 08/14/2022 1100   MONOABS 0.5 10/17/2022 1455   EOSABS 0.0 10/17/2022 1455   EOSABS 0.1 08/14/2022 1100   BASOSABS 0.1 10/17/2022 1455   BASOSABS 0.0 08/14/2022 1100    No results found for: "POCLITH", "LITHIUM"   No results found for: "PHENYTOIN", "PHENOBARB", "VALPROATE", "CBMZ"   .res Assessment: Plan:   38 minutes spent dedicated to  the care of this patient on the date of this encounter to include pre-visit review of records, ordering of medication, post visit documentation, and face-to-face time with the patient discussing response to Klonopin prn and her concern about continuing to eat certain foods that may have a negative effect on her health. Discussed that Overeaters Anonymous may be a possible resource/source of support.  Discussed that low dose Amitriptyline and Nortriptyline could be added to current medications if needed for pain.  Continue Rexulti 2 mg daily for depression and anxiety.  Continue Buspar 30 mg twice daily for anxiety.  Continue Sertraline 100 mg twice daily for anxiety and depression.  Pt to follow-up in 6 months or sooner if clinically indicated.  Patient advised to contact office with any questions, adverse effects, or acute worsening in signs and symptoms.   Jacqueline Fowler was seen today for follow-up.  Diagnoses and all orders for this visit:  Depression, unspecified depression type -     brexpiprazole (REXULTI) 2 MG TABS tablet; Take 1 tablet (2 mg total) by mouth daily. -     lamoTRIgine (LAMICTAL) 100 MG tablet; Take 1 tablet (100 mg total) by mouth daily. -     sertraline (ZOLOFT) 100 MG tablet; Take 1 tablet (100 mg total) by mouth in the morning and at bedtime.  Anxiety disorder, unspecified type -     busPIRone (BUSPAR) 30 MG tablet; Take 1 tablet (30 mg total) by mouth 2 (two) times daily.  GAD (generalized anxiety disorder) -     sertraline (ZOLOFT) 100 MG tablet; Take 1 tablet (100 mg total) by mouth in the morning and at bedtime.  Social anxiety disorder -     sertraline (ZOLOFT) 100 MG tablet; Take  1 tablet (100 mg total) by mouth in the morning and at bedtime.     Please see After Visit Summary for patient specific instructions.  Future Appointments  Date Time Provider Department Center  08/08/2023 10:00 AM Cottle, Bambi G, LCSW [facility] None  08/22/2023 10:00 AM Cottle, Bambi  G, LCSW [facility] None  09/05/2023 10:00 AM Cottle, Bambi G, LCSW [facility] None  09/19/2023 10:00 AM Cottle, Bambi G, LCSW [facility] None    No orders of the defined types were placed in this encounter.   -------------------------------

## 2023-07-25 ENCOUNTER — Ambulatory Visit: Payer: BC Managed Care – PPO | Admitting: Psychology

## 2023-07-31 DIAGNOSIS — R7303 Prediabetes: Secondary | ICD-10-CM | POA: Diagnosis not present

## 2023-07-31 DIAGNOSIS — Z79899 Other long term (current) drug therapy: Secondary | ICD-10-CM | POA: Diagnosis not present

## 2023-07-31 DIAGNOSIS — I10 Essential (primary) hypertension: Secondary | ICD-10-CM | POA: Diagnosis not present

## 2023-07-31 DIAGNOSIS — D518 Other vitamin B12 deficiency anemias: Secondary | ICD-10-CM | POA: Diagnosis not present

## 2023-07-31 DIAGNOSIS — M150 Primary generalized (osteo)arthritis: Secondary | ICD-10-CM | POA: Diagnosis not present

## 2023-07-31 DIAGNOSIS — E559 Vitamin D deficiency, unspecified: Secondary | ICD-10-CM | POA: Diagnosis not present

## 2023-07-31 DIAGNOSIS — E782 Mixed hyperlipidemia: Secondary | ICD-10-CM | POA: Diagnosis not present

## 2023-08-04 ENCOUNTER — Encounter: Payer: Self-pay | Admitting: Physician Assistant

## 2023-08-04 ENCOUNTER — Telehealth: Payer: Self-pay | Admitting: Gastroenterology

## 2023-08-04 ENCOUNTER — Ambulatory Visit: Payer: BC Managed Care – PPO | Admitting: Physician Assistant

## 2023-08-04 ENCOUNTER — Other Ambulatory Visit (INDEPENDENT_AMBULATORY_CARE_PROVIDER_SITE_OTHER): Payer: BC Managed Care – PPO

## 2023-08-04 VITALS — BP 124/80 | HR 93 | Ht 65.0 in | Wt 274.0 lb

## 2023-08-04 DIAGNOSIS — R1084 Generalized abdominal pain: Secondary | ICD-10-CM

## 2023-08-04 DIAGNOSIS — K7460 Unspecified cirrhosis of liver: Secondary | ICD-10-CM

## 2023-08-04 DIAGNOSIS — R1030 Lower abdominal pain, unspecified: Secondary | ICD-10-CM

## 2023-08-04 DIAGNOSIS — K769 Liver disease, unspecified: Secondary | ICD-10-CM | POA: Diagnosis not present

## 2023-08-04 DIAGNOSIS — K580 Irritable bowel syndrome with diarrhea: Secondary | ICD-10-CM | POA: Diagnosis not present

## 2023-08-04 LAB — COMPREHENSIVE METABOLIC PANEL
ALT: 23 U/L (ref 0–35)
AST: 29 U/L (ref 0–37)
Albumin: 4 g/dL (ref 3.5–5.2)
Alkaline Phosphatase: 68 U/L (ref 39–117)
BUN: 13 mg/dL (ref 6–23)
CO2: 25 mEq/L (ref 19–32)
Calcium: 9.3 mg/dL (ref 8.4–10.5)
Chloride: 106 mEq/L (ref 96–112)
Creatinine, Ser: 0.77 mg/dL (ref 0.40–1.20)
GFR: 88.89 mL/min (ref >60.00)
Glucose, Bld: 117 mg/dL — ABNORMAL HIGH (ref 70–99)
Potassium: 4 mEq/L (ref 3.5–5.1)
Sodium: 139 mEq/L (ref 135–145)
Total Bilirubin: 0.4 mg/dL (ref 0.2–1.2)
Total Protein: 6.6 g/dL (ref 6.0–8.3)

## 2023-08-04 LAB — CBC WITH DIFFERENTIAL/PLATELET
Basophils Absolute: 0 10*3/uL (ref 0.0–0.1)
Basophils Relative: 0.8 % (ref 0.0–3.0)
Eosinophils Absolute: 0 10*3/uL (ref 0.0–0.7)
Eosinophils Relative: 0 % (ref 0.0–5.0)
HCT: 40.8 % (ref 36.0–46.0)
Hemoglobin: 13.6 g/dL (ref 12.0–15.0)
Lymphocytes Relative: 23.9 % (ref 12.0–46.0)
Lymphs Abs: 1.4 10*3/uL (ref 0.7–4.0)
MCHC: 33.2 g/dL (ref 30.0–36.0)
MCV: 85.8 fl (ref 78.0–100.0)
Monocytes Absolute: 0.3 10*3/uL (ref 0.1–1.0)
Monocytes Relative: 4.6 % (ref 3.0–12.0)
Neutro Abs: 4 10*3/uL (ref 1.4–7.7)
Neutrophils Relative %: 70.7 % (ref 43.0–77.0)
Platelets: 194 10*3/uL (ref 150.0–400.0)
RBC: 4.76 Mil/uL (ref 3.87–5.11)
RDW: 14.8 % (ref 11.5–15.5)
WBC: 5.7 10*3/uL (ref 4.0–10.5)

## 2023-08-04 LAB — PROTIME-INR
INR: 1.1 ratio — ABNORMAL HIGH (ref 0.8–1.0)
Prothrombin Time: 11.6 s (ref 9.6–13.1)

## 2023-08-04 MED ORDER — DICYCLOMINE HCL 20 MG PO TABS: 20.0000 mg | ORAL_TABLET | Freq: Three times a day (TID) | ORAL | 3 refills | Status: DC

## 2023-08-04 NOTE — Patient Instructions (Signed)
Your provider has requested that you go to the basement level for lab work before leaving today. Press "B" on the elevator. The lab is located at the first door on the left as you exit the elevator.  We have sent the following medications to your pharmacy for you to pick up at your convenience: Dicyclomine 20 mg four times daily 20-30 minutes before meals and at bedtime.   Stop Levbid.   You have been scheduled for an MRI at [facility] on Friday 07/2723. Your appointment time is 12 pm. Please arrive to admitting (at main entrance of the hospital) 30 minutes prior to your appointment time for registration purposes. Please make certain not to have anything to eat or drink 6 hours prior to your test. In addition, if you have any metal in your body, have a pacemaker or defibrillator, please be sure to let your ordering physician know. This test typically takes 45 minutes to 1 hour to complete. Should you need to reschedule, please call [phone] to do so.  _______________________________________________________  If your blood pressure at your visit was 140/90 or greater, please contact your primary care physician to follow up on this.  _______________________________________________________  If you are age 65 or older, your body mass index should be between 23-30. Your Body mass index is 45.6 kg/m. If this is out of the aforementioned range listed, please consider follow up with your Primary Care Provider.  If you are age 49 or younger, your body mass index should be between 19-25. Your Body mass index is 45.6 kg/m. If this is out of the aformentioned range listed, please consider follow up with your Primary Care Provider.   ________________________________________________________  The [facility] providers would like to encourage you to use [facility] to communicate with providers for non-urgent requests or questions.  Due to long hold times on the telephone, sending your provider a  message by [facility] may be a faster and more efficient way to get a response.  Please allow 48 business hours for a response.  Please remember that this is for non-urgent requests.  _______________________________________________________

## 2023-08-04 NOTE — Telephone Encounter (Signed)
Patient agreed. Scheduled and made aware to go to the 3rd floor and her appointment time is at 1130am

## 2023-08-04 NOTE — Telephone Encounter (Signed)
Jacqueline Fowler has an 1130 opening today - please see if the patient can make that and if so let Jacqueline Fowler and her team know she is coming

## 2023-08-04 NOTE — Telephone Encounter (Signed)
Dr Leone Payor you are DOD today. Please advise since Dr Chales Abrahams is out of the office   Patient stated that she has had 3-4 bowel movements in the past 2 months that has accompanied abdominal pain that runs into her back . Yesterday out of the 4 bowel movements she had 1 was real severe that she said she felt like going to the emergency room. She would like some advise with this. She told me that she stopped going to the IBD clinic because she was told conflicting information. I also noticed that she didn't repeat her 6 month follow MRI as well.  Please advise

## 2023-08-04 NOTE — Progress Notes (Signed)
Chief Complaint: Cirrhosis with abdominal pain  HPI:    Jacqueline Fowler is a 52 year old female with a past medical history as listed below including cirrhosis, reflux and multiple others, known to Dr. Chales Abrahams, who presents to clinic today as an urgent add-on for history of cirrhosis with abdominal pain.    10/17/2022 patient seen in clinic by Dr. Chales Abrahams.  At that time discussed her liver cirrhosis which was Marjo Bicker class a on CT 6/22 with mild splenomegaly, thought due to NAFLD.  No history of alcohol use.  EGD 8/22 with no esophageal varices.  Reflux with small hiatal hernia and esophageal stricture which had been dilated.  IBS-D and post Coley diarrhea.  Negative colon with biopsies 01/2021, stool studies with positive Campylobacter and treated with Azithromycin.  History of tubular adenomas with repeat colon recommended 01/2024.  Also incidental liver lesion 14 mm with stable mesenteric adenopathy on MRI of the liver 07/2022.  Repeat MRI recommended with contrast in 3 months.  Continued on Inderal 10 mg p.o. twice daily, Protonix 40 twice daily, Pepcid 40 mg nightly, CBC, CMP, AFP and INR.    10/28/22 patient changed from Protonix to Aciphex 20 mg p.o. every morning and Pepcid 20 mg nightly.    11/17/22 patient wanted to go back to Pantoprazole.  Went back to Pantoprazole 40 p.o. twice daily and Pepcid 20 mg nightly.  Told to follow liver clinic once a year.  Carafate then added.    12/13/2022 upper GI with severe repeat episodes of GERD, small mixed type hiatal hernia mildly distended proximal small bowel loops.    Today, patient presents to clinic with a complaint of some sharp severe abdominal cramping pains, rated as a 10/10.  She has had 3-4 episodes over the past 2 months, her last just yesterday.  Tells me she has had IBS with diarrhea for many years and is on Dicyclomine 10 mg 3 times a day as well as Levbid 0.375 mg twice a day and has been for a long time.  Regardless of these medicines she occasionally  gets abdominal cramping but again has had 3-4 severe episodes where she is calling out/crying in horrific abdominal pain/back pain when having a bowel movement.  When she is able to get all her stool out which is typically after she visits the toilet 2-3 times then this pain goes away.  She even thought about going to the ER over the weekend because it was so intense.    Does asked some questions in regards to her cirrhosis and follow-up imaging for a liver lesion.    Denies fever, chills, weight loss or blood in her stool.  Past GI work-up: MRI liver with contrast 07/16/2022 1. Stable LI-RADS category LR-3 lesion posteriorly in the right hepatic lobe. In most cases, surveillance hepatic protocol MR imaging with and without contrast in 3-6 months is indicated. 2. Hepatic cirrhosis with reactive porta hepatis lymph nodes. Mild splenomegaly. 3.  Aortic Atherosclerosis (ICD10-I70.0).   MRI liver with and without contrast 04/2022 IMPRESSION: 1. Hepatomegaly with evidence of hepatic cirrhosis and steatosis. 2. 14 mm enhancing lesion in the posterior right hepatic lobe without washout, LI-RADS 3. Consider repeat imaging in 3-6 months. 3. Splenomegaly and small varices. 4. Small hiatal hernia. 5. Stable enlarged lymph nodes in the upper abdomen.   EGD with dil 06/13/2021 - Benign-appearing esophageal stenosis. Dilated. Bx- neg EoE - Small HH - Gastritis. Bx- neg HP - Normal examined duodenum. Bx- neg celiac   CT AP  with contrast 04/25/2021 1. Cirrhotic hepatic morphology with evidence of portal hypertension including splenomegaly. 2. Enlarged upper abdominal lymph nodes, nonspecific and in the setting of hepatocellular disease are possibly reactive. 3. Punctate nonobstructive left renal stone. 4. Small hiatal hernia. Enteric contrast within the distal esophagus may reflect gastroesophageal reflux or esophageal dysmotility. 5. Aortic Atherosclerosis (ICD10-I70.0).   Colonoscopy  01/2019 -Colonic polyp s/p polypectomy. Bx- SSA. Rpt in 5 yrs -Small internal hemorrhoids. -Otherwise normal colonoscopy to TI. -Neg random colon biopsies for microscopic colitis. -Neg random TI biopsies.   [place] 08/2021 1. Patent hepatic vasculature with normal velocities and directional flow. 2. Increased, slightly coarsened echogenicity of the hepatic parenchyma with nodularity hepatic contour compatible with provided history of cirrhosis. No discrete hepatic lesions though further evaluation with abdominal MRI could be performed as indicated. 3. Borderline splenomegaly as could be seen in the setting of portal venous hypertension. No ascites. 4. Punctate (approximately 6 mm) nonobstructing left-sided renal stone, as demonstrated on abdominal CT performed 04/2021. 5. Post cholecystectomy.   WU for liver disease 04/2021 -Nl AFP, ceruloplasmin. Neg acute hepatitis profile.  Not immune to hepatitis A/B.  Got first vaccine. -Iron saturation 15% -LFTs: Alb 4.0, AST 23, ALT 26, TB 0.4 -PT INR 1.1 -ANA +ve 1:160 -Neg celiac screen -ASMA <20 -IgG Nl   CT 04/2009 -Fatty liver  Past Medical History:  Diagnosis Date   Anxiety    B12 deficiency    Back pain    Chest pain    Cirrhosis of liver (HCC)    Depression    Fatty liver    Gallbladder problem    Gallstones 2004   GERD (gastroesophageal reflux disease)    History of stomach ulcers    IBS (irritable bowel syndrome)    Joint pain    Obesity    Pre-diabetes    Swallowing difficulty    UTI (urinary tract infection)    Vitamin D deficiency     Past Surgical History:  Procedure Laterality Date   BREAST BIOPSY Right    age 35 or 64 cyst that was removed   CHOLECYSTECTOMY  2004   COLONOSCOPY  08/07/2009   Small internal hemorrhoids. Otherwise normal colonoscopy to TI.    ESOPHAGOGASTRODUODENOSCOPY  05/31/2008   Esophageal stricture status post dilation. Mild gastritis.    esophagus stretching     IUD REMOVAL      REPAIR RECTOCELE     01/22/22   TOTAL VAGINAL HYSTERECTOMY     tummy tuck  03/20/2020   UPPER GASTROINTESTINAL ENDOSCOPY      Current Outpatient Medications  Medication Sig Dispense Refill   acetaminophen (TYLENOL) 500 MG tablet Take 500 mg by mouth as needed.     brexpiprazole (REXULTI) 2 MG TABS tablet Take 1 tablet (2 mg total) by mouth daily. 90 tablet 1   busPIRone (BUSPAR) 30 MG tablet Take 1 tablet (30 mg total) by mouth 2 (two) times daily. 180 tablet 1   Cholecalciferol (VITAMIN D3 PO) Take by mouth.     clonazePAM (KLONOPIN) 1 MG tablet Take 1/2-1 tablet twice daily as needed for panic 30 tablet 0   COLLAGEN PO Take by mouth.     cyclobenzaprine (FLEXERIL) 5 MG tablet Take 5 mg by mouth 3 (three) times daily.     dicyclomine (BENTYL) 10 MG capsule TAKE 1 CAPSULE BY MOUTH 2 TO 3 TIMES A DAY 270 capsule 1   famotidine (PEPCID) 20 MG tablet TAKE 1 TABLET BY MOUTH EVERYDAY AT BEDTIME 90  tablet 0   hyoscyamine (LEVBID) 0.375 MG 12 hr tablet TAKE 1 TABLET BY MOUTH TWICE A DAY 180 tablet 1   lamoTRIgine (LAMICTAL) 100 MG tablet Take 1 tablet (100 mg total) by mouth daily. 90 tablet 1   loperamide (IMODIUM) 2 MG capsule Take by mouth as needed for diarrhea or loose stools.     Multiple Vitamins-Minerals (CENTRUM SILVER 50+WOMEN PO) Take by mouth.     oxyCODONE-acetaminophen (PERCOCET/ROXICET) 5-325 MG tablet Take 1 tablet by mouth every 6 (six) hours as needed.     pantoprazole (PROTONIX) 40 MG tablet Take 1 tablet (40 mg total) by mouth 2 (two) times daily. 90 tablet 3   phentermine (ADIPEX-P) 37.5 MG tablet Take 37.5 mg by mouth daily before breakfast.     propranolol (INDERAL) 10 MG tablet TAKE 1 TO 2 TABLETS BY MOUTH TWICE A DAY AS NEEDED FOR ANXIETY (Patient taking differently: Take 10 mg by mouth 2 (two) times daily. TAKE 1 TO 2 TABLETS BY MOUTH TWICE A DAY AS NEEDED FOR ANXIETY) 360 tablet 1   Pyridoxine HCl (VITAMIN B-6 PO) Take by mouth.     sertraline (ZOLOFT) 100 MG tablet Take  1 tablet (100 mg total) by mouth in the morning and at bedtime. 180 tablet 1   Current Facility-Administered Medications  Medication Dose Route Frequency Provider Last Rate Last Admin   0.9 %  sodium chloride infusion  500 mL Intravenous Once Lynann Bologna, MD        Allergies as of 08/04/2023   (No Known Allergies)    Family History  Problem Relation Age of Onset   Depression Mother    Anxiety disorder Mother    Anxiety disorder Father    Depression Father    Heart disease Father    Depression Son    Colon cancer Neg Hx    Esophageal cancer Neg Hx    Rectal cancer Neg Hx    Stomach cancer Neg Hx     Social History   Socioeconomic History   Marital status: Married    Spouse name: Not on file   Number of children: 4   Years of education: Not on file   Highest education level: Not on file  Occupational History   Occupation: [employer]  Tobacco Use   Smoking status: Former   Smokeless tobacco: Never   Tobacco comments:    quit 2001 smoking  Vaping Use   Vaping status: Never Used  Substance and Sexual Activity   Alcohol use: Not Currently    Alcohol/week: 1.0 standard drink of alcohol    Types: 1 Glasses of wine per week    Comment: ocassionally   Drug use: Never   Sexual activity: Yes    Birth control/protection: Coitus interruptus    Comment: husband had vasectomy  Other Topics Concern   Not on file  Social History Narrative   Not on file   Social Determinants of Health   Financial Resource Strain: Not on file  Food Insecurity: Not on file  Transportation Needs: Not on file  Physical Activity: Not on file  Stress: Not on file  Social Connections: Not on file  Intimate Partner Violence: Not on file    Review of Systems:    Constitutional: No weight loss, fever or chills Cardiovascular: No chest pain Respiratory: No SOB Gastrointestinal: See HPI and otherwise negative    Physical Exam:  Vital signs: BP 124/80   Pulse 93   Ht 5\' 5"   (1.651  m)   Wt 274 lb (124.3 kg)   LMP 05/15/2021 (Approximate)   BMI 45.60 kg/m    Constitutional:   Pleasant obese Caucasian female appears to be in NAD, Well developed, Well nourished, alert and cooperative Respiratory: Respirations even and unlabored. Lungs clear to auscultation bilaterally.   No wheezes, crackles, or rhonchi.  Cardiovascular: Normal S1, S2. No MRG. Regular rate and rhythm. No peripheral edema, cyanosis or pallor.  Gastrointestinal:  Soft, nondistended, nontender. No rebound or guarding. Normal bowel sounds. No appreciable masses or hepatomegaly. Rectal:  Not performed.  Psychiatric:  Demonstrates good judgement and reason without abnormal affect or behaviors.  RELEVANT LABS AND IMAGING: CBC    Component Value Date/Time   WBC 5.4 04/21/2023 1020   RBC 4.98 04/21/2023 1020   HGB 14.0 04/21/2023 1020   HGB 14.0 08/14/2022 1100   HCT 42.8 04/21/2023 1020   HCT 42.7 08/14/2022 1100   PLT 221.0 04/21/2023 1020   PLT 217 08/14/2022 1100   MCV 85.9 04/21/2023 1020   MCV 85 08/14/2022 1100   MCH 27.8 08/14/2022 1100   MCHC 32.7 04/21/2023 1020   RDW 14.7 04/21/2023 1020   RDW 15.1 08/14/2022 1100   LYMPHSABS 1.6 10/17/2022 1455   LYMPHSABS 1.4 08/14/2022 1100   MONOABS 0.5 10/17/2022 1455   EOSABS 0.0 10/17/2022 1455   EOSABS 0.1 08/14/2022 1100   BASOSABS 0.1 10/17/2022 1455   BASOSABS 0.0 08/14/2022 1100    CMP     Component Value Date/Time   NA 137 04/21/2023 1020   NA 138 08/14/2022 1100   K 4.3 04/21/2023 1020   CL 106 04/21/2023 1020   CO2 24 04/21/2023 1020   GLUCOSE 95 04/21/2023 1020   BUN 18 04/21/2023 1020   BUN 15 08/14/2022 1100   CREATININE 0.73 04/21/2023 1020   CALCIUM 9.0 04/21/2023 1020   PROT 6.8 04/21/2023 1020   PROT 7.1 08/14/2022 1100   ALBUMIN 4.2 04/21/2023 1020   ALBUMIN 4.6 08/14/2022 1100   AST 27 04/21/2023 1020   ALT 21 04/21/2023 1020   ALKPHOS 74 04/21/2023 1020   BILITOT 0.4 04/21/2023 1020   BILITOT 0.5  08/14/2022 1100    Assessment: 1.  Generalized abdominal pain: Severe episodes of pain 3-4 over the past 2 months related to bowel movements, history of IBS; consider IBS +/- other 2.  IBS-D: Chronic for years, thought some association with postcholecystectomy diarrhea, negative colon with biopsies in March 2020, continues with diarrhea off-and-on 3.  NAFLD cirrhosis: Marjo Bicker class a on last calculation, mild splenomegaly, thought due to NAFLD, no alcohol, no varices on EGD 06/2021 and patient maintained on Propranolol 10 mg twice daily 4.  Abnormal CT of the liver: Showing an incidental liver lesion 14 mm with stable mesenteric adenopathy on MRI of the liver 07/2022, repeat recommended now 5.  History of tubular adenomatous colon: Next colonoscopy recommended in March 2025  Plan: 1.  Patient did ask if she should be following with the liver clinic.  Per Dr. Urban Gibson recommendations she should follow-up with them at least once a year. 2.  Will repeat labs today to recalculate MELD and bring her up-to-date with screening, CBC, CMP, PTT/INR and AFP. 3.  Discussed abdominal pain.  She is taking 2 separate antispasmodic medicines at the same time.  I would recommend she discontinue Levbid and we will increase Dicyclomine to 20 mg 4 times daily, 20-30 minutes before meals and at bedtime #120 with 3 refills. 4.  Ordered MRI with  and without contrast of the abdomen and pelvis, and generalized abdominal pain, change in bowel habits and liver lesion 5.  Patient to follow in clinic in the next 3 to 4 months with me after above.  Discussed that if the change in antispasmodics does not help and the MRI does not show any cause then could consider repeating her colonoscopy sooner.  Hyacinth Meeker, PA-C [facility] 08/04/2023, 11:30 AM  Cc: Galvin Proffer, MD

## 2023-08-04 NOTE — Telephone Encounter (Signed)
Inbound call from patient, states she is having "horrific" stomach pains, states everytime she has a bowel movement its intense pain. States she would like to speak to [place].

## 2023-08-05 LAB — AFP TUMOR MARKER: AFP-Tumor Marker: 3.6 ng/mL

## 2023-08-07 ENCOUNTER — Telehealth: Payer: Self-pay | Admitting: Physician Assistant

## 2023-08-07 NOTE — Telephone Encounter (Signed)
Left message for patient to call back  

## 2023-08-07 NOTE — Telephone Encounter (Signed)
Spoke with patient and explained the importance of her MRI.

## 2023-08-07 NOTE — Telephone Encounter (Signed)
Inbound call from patient wishing to speak further about MRI's that are scheduled for her. Patient states she has some questions that she would like to discuss prior to continuing with MRI. Patient requesting a call back. Please advise, thank you.

## 2023-08-08 ENCOUNTER — Ambulatory Visit (HOSPITAL_COMMUNITY): Payer: BC Managed Care – PPO

## 2023-08-08 ENCOUNTER — Ambulatory Visit (INDEPENDENT_AMBULATORY_CARE_PROVIDER_SITE_OTHER): Payer: BC Managed Care – PPO | Admitting: Psychology

## 2023-08-08 ENCOUNTER — Ambulatory Visit (HOSPITAL_COMMUNITY): Admission: RE | Admit: 2023-08-08 | Payer: BC Managed Care – PPO | Source: Ambulatory Visit

## 2023-08-08 DIAGNOSIS — F32A Depression, unspecified: Secondary | ICD-10-CM

## 2023-08-08 DIAGNOSIS — F419 Anxiety disorder, unspecified: Secondary | ICD-10-CM | POA: Diagnosis not present

## 2023-08-08 NOTE — Progress Notes (Unsigned)
                Takeyla Million G Kaedan Richert, LCSW

## 2023-08-11 ENCOUNTER — Encounter: Payer: Self-pay | Admitting: Gastroenterology

## 2023-08-13 ENCOUNTER — Telehealth: Payer: Self-pay | Admitting: *Deleted

## 2023-08-13 NOTE — Telephone Encounter (Signed)
Patient called upon receiving information from the Pre-authorization Insurance team  approving patient's MRI ABD W/WO contrast, and the MRI Pelvis W/WO contrast to be done at [facility] in [facility] instead of [place] due to out of pocket cost the patient would have to pay. [facility] [employer] were faxed orders given per Ms. Lemmon, [place] on 08/04/23, along with insurance information, Snapshot, labs and notes with patient's last visit. Noted on the cover sheet is the [facility]  fax number to fax back the results of both MRI's. Fax number given is [phone].

## 2023-08-13 NOTE — Telephone Encounter (Signed)
Charyl Bigger, Donald Pore A  You; [place], April D; [place], Charleen Kirks; [place], [place] I21 hours ago (10:55 AM)   JS Hello, I have approved the case for [facility] in [place].   You routed conversation to [place], April D; Charyl Bigger, Cathren Laine; [place], Charleen Kirks; [place], [place] I2 days ago   Calise Dunckel League  P Lgi Clinical Pool (supporting Lynann Bologna, MD)2 days ago    Also, if there's somewhere else you can recommend, that is less expense than [facility], then I need to know that as well. Thank you!    Lenny Pastel Bry  P Lgi Clinical Pool (supporting Lynann Bologna, MD)2 days ago    I was in last week and saw Victorino Dike. She had me going to [facility] for MRI's but my out of pocket was over $4k so I cancelled them. I need to know if an order can be sent to [facility] in [facility]? If not, I will not be able to have the two scans.

## 2023-08-13 NOTE — Telephone Encounter (Signed)
Charyl Bigger, Wannetta Sender, RN; Beverely Pace, April D Cc: [place], Charleen Kirks; [place], [place] I Hello, I have approved the case for [facility] in [place].

## 2023-08-15 ENCOUNTER — Telehealth: Payer: Self-pay | Admitting: *Deleted

## 2023-08-15 ENCOUNTER — Other Ambulatory Visit: Payer: Self-pay | Admitting: Gastroenterology

## 2023-08-15 DIAGNOSIS — K449 Diaphragmatic hernia without obstruction or gangrene: Secondary | ICD-10-CM

## 2023-08-15 NOTE — Telephone Encounter (Signed)
Patient called with noted authorization number and dates received form the Insurance authorization team. Authorization number given to patient as well as the date noted below:  Charyl Bigger, Wannetta Sender, RN; [place], April D; [place], Mariane Duval; [place], Charleen Kirks The scan is authorized for [facility] in [place]  Auth#: [id number]        valid dates: 9/23-10/22

## 2023-08-15 NOTE — Telephone Encounter (Signed)
Patient called in reference to the MRI, stating [facility] called for scheduling and she received in the mail from her insurance company that the MRI is approved. Explained to the patient that the information sent to [facility] was stating there was a hold for Authorization. Not fully understanding what this was about involving the insurance, informed the patient it was sent to our [employer] team. Insured the patient as soon as I heard anything from the Authorization team that I will be calling her. Patient agreed.

## 2023-08-15 NOTE — Telephone Encounter (Signed)
-----   Message from Carman Ching sent at 08/15/2023  3:23 PM EDT ----- The scan is authorized for [facility] in [place]   Auth#: [id number]        valid dates: 9/23-10/22 ----- Message ----- From: Avanell Shackleton, RN Sent: 08/15/2023   3:03 PM EDT To: Marylynn Pearson; April D McPeak; #  Orders for a MRI of pelvis w/wo contrast and MRI  ABD w/wo contrast per Osborn Coho sent to [facility] in [facility] due to patient inability to pay out of pocket to have the procedure done at [facility]. States on information sheet that the procedure is holding for authorization. Patient does have insurance.

## 2023-08-16 NOTE — Progress Notes (Signed)
Agree with assessment/plan.  Raj Florestine Carmical, MD [facility] [phone]  

## 2023-08-18 NOTE — Telephone Encounter (Signed)
Patient called initially in reference to the MRI insurance approval number and dates for authorization given via our Insurance authorization team. When the nurse called the patient, she stated she had received information stating the scheduled date for the MRI with [facility] in [facility]. Patient states she feels the issue has been addressed at this time. She also states the out of pocket cost is less as opposed to having the MRI done at the hospital. Patient is satisfied with the ability of having the MRI done for less out of pocket cost.

## 2023-08-18 NOTE — Telephone Encounter (Signed)
Inbound call from patient, requesting to speak with Alona Bene in regards to prior authorization for MRI.

## 2023-08-21 DIAGNOSIS — K760 Fatty (change of) liver, not elsewhere classified: Secondary | ICD-10-CM | POA: Diagnosis not present

## 2023-08-21 DIAGNOSIS — K7460 Unspecified cirrhosis of liver: Secondary | ICD-10-CM | POA: Diagnosis not present

## 2023-08-21 DIAGNOSIS — R1030 Lower abdominal pain, unspecified: Secondary | ICD-10-CM | POA: Diagnosis not present

## 2023-08-21 DIAGNOSIS — K769 Liver disease, unspecified: Secondary | ICD-10-CM | POA: Diagnosis not present

## 2023-08-21 DIAGNOSIS — R160 Hepatomegaly, not elsewhere classified: Secondary | ICD-10-CM | POA: Diagnosis not present

## 2023-08-21 DIAGNOSIS — K449 Diaphragmatic hernia without obstruction or gangrene: Secondary | ICD-10-CM | POA: Diagnosis not present

## 2023-08-21 DIAGNOSIS — R590 Localized enlarged lymph nodes: Secondary | ICD-10-CM | POA: Diagnosis not present

## 2023-08-21 DIAGNOSIS — K580 Irritable bowel syndrome with diarrhea: Secondary | ICD-10-CM | POA: Diagnosis not present

## 2023-08-22 ENCOUNTER — Telehealth: Payer: Self-pay | Admitting: *Deleted

## 2023-08-22 ENCOUNTER — Ambulatory Visit: Payer: BC Managed Care – PPO | Admitting: Psychology

## 2023-08-22 ENCOUNTER — Other Ambulatory Visit: Payer: Self-pay | Admitting: Gastroenterology

## 2023-08-22 ENCOUNTER — Encounter: Payer: Self-pay | Admitting: Gastroenterology

## 2023-08-22 DIAGNOSIS — K219 Gastro-esophageal reflux disease without esophagitis: Secondary | ICD-10-CM

## 2023-08-22 NOTE — Telephone Encounter (Signed)
Called patient in reference to complaints about coughing after eating and having liquid come up in mouth, does not believe it is reflux because food is not coming up. Patient continues to take her medication for GERD. Patient also states she has liquid that comes into her mouth while lying down between 8 and midnight and again does not believe it is reflux. Main concern is coughing to the point there is some vomiting noted. Patient also states she gets choked and has choked 2-3 different times while eating. Please advise.

## 2023-08-25 ENCOUNTER — Other Ambulatory Visit: Payer: Self-pay | Admitting: *Deleted

## 2023-08-25 DIAGNOSIS — K219 Gastro-esophageal reflux disease without esophagitis: Secondary | ICD-10-CM

## 2023-08-25 DIAGNOSIS — K7460 Unspecified cirrhosis of liver: Secondary | ICD-10-CM

## 2023-08-25 MED ORDER — FAMOTIDINE 40 MG PO TABS
40.0000 mg | ORAL_TABLET | Freq: Two times a day (BID) | ORAL | 3 refills | Status: DC
Start: 2023-08-25 — End: 2023-11-20

## 2023-08-25 NOTE — Telephone Encounter (Signed)
Called patient in reference to complaints about coughing after eating and having liquid come up in mouth, does not believe it is reflux because food is not coming up. Patient continues to take her medication for GERD. Patient also states she has liquid that comes into her mouth while lying down between 8 and midnight and again does not believe it is reflux. Main concern is coughing to the point there is some vomiting noted. Patient also states she gets choked and has choked 2-3 different times while eating. Please advise.

## 2023-08-25 NOTE — Telephone Encounter (Signed)
This encounter has been addressed via Ms. Paulla Fore, RN

## 2023-08-25 NOTE — Telephone Encounter (Signed)
Called patient in reference to the call on 08/22/23 due to complaints of reflux. Ms. Jacqueline Fowler ordered and increase in the Famotidine medication to 40 mg daily, in am and at bedtime. Patient is also presently taking the Pantoprazole medication at 40 mg BID. Patient understood and agreed.

## 2023-08-27 ENCOUNTER — Other Ambulatory Visit: Payer: Self-pay | Admitting: Physician Assistant

## 2023-08-27 NOTE — Progress Notes (Signed)
08/27/2023 9:05 AM  Received results from recent MRI of abdomen with and without contrast completed on 08/21/2023 for cirrhosis and follow-up of liver lesion.  MRI showed unchanged, lobulated intrinsically T2 hypertense arterially hyperenhancing lesion of the posterior right lobe of the liver, hepatic segment 7, measuring 1.2 x 0.9 cm.  Strongly suggested a flash filling hemangioma.  However remained technically characterized as a L1-RADS category 3 lesion due to the presence of arterial hyperenhancement.  Recommended follow-up in 6 months.  No new liver lesions.  Cirrhosis, Pado megaly and hepatic steatosis.  Unchanged enlarged gastrohepatic ligament lymph node, most likely reactive to cirrhosis.  Will place patient in recall for repeat imaging in 6 months.  Will also let Dr. Chales Abrahams reviewed to make sure he has no other recommendations.  Hyacinth Meeker, PA-C

## 2023-09-03 DIAGNOSIS — M7918 Myalgia, other site: Secondary | ICD-10-CM | POA: Diagnosis not present

## 2023-09-03 DIAGNOSIS — M47816 Spondylosis without myelopathy or radiculopathy, lumbar region: Secondary | ICD-10-CM | POA: Diagnosis not present

## 2023-09-04 ENCOUNTER — Telehealth: Payer: Self-pay | Admitting: Psychiatry

## 2023-09-04 NOTE — Telephone Encounter (Signed)
Called patient in regards to patient calling to find out about labs as noted in chart. Upon calling patient, she inform me she was interested in finding out about the reading for the MRI ABD W/WO contrast. Informed patient the MRI was not noted in the chart and we are behind in receiving these readings. Patient informed the nurse that she has the readings noted in her MyChart. Nurse looked initially and could not find the procedure in her chart. Listed in Everywhere in [employer]. Information sent to Ms. Osborn Coho to inform the nurse of the readings to be able to inform the patient. Patient also informed the nurse, after seeing her Ortho provider, she wants to know if it would be ok to take Lyrica ( for her fibromyalgia) due to her diagnosed Cirrhosis? This medication will be ordered via her Ortho provider if it is ok to take. Informed the patient, I will contact her as soon as I find out. Patient understood and agreed.

## 2023-09-04 NOTE — Telephone Encounter (Signed)
Patient called requesting to speak with you regarding recent lab results.

## 2023-09-04 NOTE — Telephone Encounter (Signed)
Pt lvm asking if it is ok for her to take lyrica with all the psche meds that [place] prescribes. Please call her at [phone]

## 2023-09-04 NOTE — Telephone Encounter (Signed)
Noted. I will give her a call.

## 2023-09-05 ENCOUNTER — Telehealth: Payer: Self-pay | Admitting: Physician Assistant

## 2023-09-05 ENCOUNTER — Other Ambulatory Visit: Payer: Self-pay | Admitting: *Deleted

## 2023-09-05 ENCOUNTER — Ambulatory Visit (INDEPENDENT_AMBULATORY_CARE_PROVIDER_SITE_OTHER): Payer: BC Managed Care – PPO | Admitting: Psychology

## 2023-09-05 DIAGNOSIS — F331 Major depressive disorder, recurrent, moderate: Secondary | ICD-10-CM

## 2023-09-05 DIAGNOSIS — K7460 Unspecified cirrhosis of liver: Secondary | ICD-10-CM

## 2023-09-05 DIAGNOSIS — F419 Anxiety disorder, unspecified: Secondary | ICD-10-CM

## 2023-09-05 DIAGNOSIS — D1800 Hemangioma unspecified site: Secondary | ICD-10-CM

## 2023-09-05 NOTE — Telephone Encounter (Signed)
Called patient to inform of Dr. Urban Gibson reading for the MRI ABD/ Pelvis w/wo contrast done at the [facility]. Pat[place] was informed that it looks like Hemangioma as before, radiologist wants to make sure that this is nothing else and wants to repeat in 6 months. Patient understood and agreed. Also asked about the medication Zetia and if it would interfere with Cirrhosis. Informed the patient that Ms. Lemmon, PA states it should not cause any issues. Patient is satisfied.

## 2023-09-05 NOTE — Telephone Encounter (Signed)
Please let her know that there are no known drug interactions with Lyrica and her current psych meds.

## 2023-09-05 NOTE — Telephone Encounter (Signed)
Patient states she has been speaking with you regarding some results.  She said she would like you to call her today, if possible, as she doesn't want to go into the weekend not knowing anything.  Please call patient and advise.  Thank you.

## 2023-09-05 NOTE — Progress Notes (Unsigned)
                Evelin Cake G Cosandra Plouffe, LCSW

## 2023-09-05 NOTE — Telephone Encounter (Signed)
Called patient and informed her that as soon as I am aware of what the reading is, I will call and make her aware as well. Dr. Chales Abrahams has been informed of her concerns and her wanting to be explained the implications of the reading itself. Left message on VM.

## 2023-09-08 ENCOUNTER — Telehealth: Payer: Self-pay | Admitting: *Deleted

## 2023-09-08 NOTE — Telephone Encounter (Signed)
Inbound call from patient, states the radiology department called to schedule a MRI, patient is unsure if this is a new order or the order she needs to have done in 6 months, patient does not want it at [facility] would like it sent to [facility].

## 2023-09-08 NOTE — Telephone Encounter (Signed)
Called patient to inform that we had discussed on Friday about the fact that radiology wants to recheck in 6 months after having read her MRI via Dr. Chales Abrahams. Dr. Chales Abrahams stated the MRI will be rechecked in 6 months and order written and sent to radiology for scheduling. Patient also called and left message to have the MRI done at [facility] due to out of pocket cost. Reminder sent to self.

## 2023-09-08 NOTE — Telephone Encounter (Signed)
-----   Message from Unk Lightning sent at 09/04/2023 11:36 AM EDT ----- Regarding: MRI Dr. Chales Abrahams- see MRI in care everywhere- recommend continue imaging surveillance in 6 mos- can this be done with CT in future?    As far as Lyrica- looks like it should be ok as long as she is having continual monitoring of her platelets by [place] or PCP q4-6 mos to ensure these are not decreasing.  Thanks-JLL ----- Message ----- From: Avanell Shackleton, RN Sent: 09/04/2023  11:33 AM EDT To: Unk Lightning, PA  Patient wants to know what the findings are for the MRI ABD W/WO contrast that was done at [facility] on 08/21/23. Noted in [facility]. She also wants to know if Lyrica ( a medication her Ortho provider wants to order for her), for fibromyalgia, will interfere with her Cirrhosis?  Thanks

## 2023-09-08 NOTE — Telephone Encounter (Signed)
Called patient in regards to the information Dr. Chales Abrahams had given or ordered in reference to the medication Lyrica and the scheduling of the MRI in 6 months. Informed the patient Dr. Chales Abrahams states taking the Lyrica should be ok as long as the patient is having continual monitoring of her platelets by [place] or her PCP q 4-6 mos to ensure these are not decreasing. Patient states she understood and is in agreement.

## 2023-09-09 NOTE — Telephone Encounter (Signed)
I called the patient and left a detailed message on her answering machine  MRI 08/21/2023 1. Unchanged, lobulated intrinsically T2 hyperintense arterially  hyperenhancing lesion of the posterior right lobe of the liver,  hepatic segment VII, measuring 1.2 x 0.9 cm. Contrast enhancement is  at approximately blood pool on all remaining contrast phases.  Appearance again strongly suggests a flash filling hemangioma,  however this lesion remains technically characterized as a LI-RADS  category 3 lesion due to the presence of arterial hyperenhancement.  Recommend continued follow-up in 6 months.  2. No new liver lesions.  3. Cirrhosis, hepatomegaly, and hepatic steatosis.  4. Unchanged enlarged gastrohepatic ligament lymph node, most likely  reactive to cirrhosis. Continued attention on follow-up.   Radiology recommends repeating MRI in 6 months.  Can be done through [facility].  I believe she is already scheduled.  She had normal alpha-fetoprotein.  She needs to follow-up with JLL after MRI.

## 2023-09-18 ENCOUNTER — Telehealth: Payer: Self-pay

## 2023-09-18 NOTE — Telephone Encounter (Signed)
Prior Authorization Rex[employer] 2 mg [employer]  Approved Effective:  09/18/23-09/17/24

## 2023-09-19 ENCOUNTER — Ambulatory Visit: Payer: BC Managed Care – PPO | Admitting: Psychology

## 2023-09-19 DIAGNOSIS — Z8262 Family history of osteoporosis: Secondary | ICD-10-CM | POA: Diagnosis not present

## 2023-09-24 ENCOUNTER — Encounter: Payer: Self-pay | Admitting: Psychiatry

## 2023-09-30 ENCOUNTER — Encounter: Payer: Self-pay | Admitting: Gastroenterology

## 2023-10-02 NOTE — Telephone Encounter (Signed)
Patient states in reference to the previous message sent that, " I wanted to know if it has anything to do with my cirrhosis, I read that bruising can be caused due to having cirrhosis of the liver and was wondering if there was anything to be concerned about. Patient also states that there is pain at the site of the bruising.

## 2023-10-02 NOTE — Telephone Encounter (Signed)
Called patient in response to being concern ed about her bruising. Patient was informed via Ms. Lemmon that the bruising only means she is easy to bruise and it does not mean that her cirrhosis is getting any worse. Patient is content and appears satisfied with the answer provided.

## 2023-10-03 ENCOUNTER — Ambulatory Visit: Payer: BC Managed Care – PPO | Admitting: Psychology

## 2023-10-03 DIAGNOSIS — F419 Anxiety disorder, unspecified: Secondary | ICD-10-CM

## 2023-10-03 DIAGNOSIS — F331 Major depressive disorder, recurrent, moderate: Secondary | ICD-10-CM

## 2023-10-03 NOTE — Progress Notes (Signed)
[facility] Counselor/Therapist Progress Note  Patient ID: MAEGEN SENFT, MRN: [id number],    Date: 10/03/2023  Time Spent: 60 minutes  Time in: 10:00  Time out:11:00  Treatment Type: Individual Therapy  Reported Symptoms: depression  Mental Status Exam: Appearance:  Casual     Behavior: Appropriate  Motor: Normal  Speech/Language:  Normal Rate  Affect: Blunt  Mood: pleasant  Thought process: normal  Thought content:   WNL  Sensory/Perceptual disturbances:   WNL  Orientation: oriented to person, place, time/date, and situation  Attention: Good  Concentration: Good  Memory: WNL  Fund of knowledge:  Good  Insight:   Good  Judgment:  Good  Impulse Control: Good   Risk Assessment: Danger to Self:  No Self-injurious Behavior: No Danger to Others: No Duty to Warn:no Physical Aggression / Violence:No  Access to Firearms a concern: No  Gang Involvement:No   Subjective: The patient attended a face-to-face individual therapy session in the office today.  The patient reports that she has been feeling relatively well.  She has occasions where she tends to give into her depression but she is managing that so much better than she has before she talked about wanting to work on her procrastination issues.  We talked about the need for her to work with her self on having a more regular schedule especially since she stays home all the time.  We talked about the importance of her getting up and getting ready and setting a time where she works on things and keeping that time as the schedule time to do that.  She also wants to start her book about losing her son and I encouraged her to go ahead and set up some time to start writing about that.  The patient has made such good progress with therapy and medication management.  She does not seem to be going back in time and wishing that something would be different.  We had a discussion about that as well and how there is no way to  make any changes in the past but she could have realistic expectations and make sure that she learns from what ever circumstance happens. Interventions: Cognitive Behavioral Therapy, EMDR, problem solving  Diagnosis:Anxiety disorder, unspecified type  Major depressive disorder, recurrent episode, moderate (HCC)  Plan: Client Abilities/Strengths  Intelligent, motivated, insightful  Client Treatment Preferences  Outpatient Individual therapy  Client Statement of Needs  "I need some help with my depression"  Treatment Level  Outpatient Individual therapy  Symptoms  Depressed or irritable mood.:  (Status: Improved). Feelings of hopelessness,  worthlessness, or inappropriate guilt.: (Status: maintained). History of chronic  or recurrent depression for which the client has taken antidepressant medication, been hospitalized, had outpatient treatment, or had a course of electroconvulsive therapy.: (Status:  maintained). Lack of energy.:  (Status: maintained). Low self-esteem.:  (Status: maintained). Poor concentration and indecisiveness.: (Status: maintained). Sleeplessness or hypersomnia.: (Status:  maintained). Social withdrawal.: (Status: maintained).  Problems Addressed  Unipolar Depression, Unipolar Depression    Goals 1. Develop healthy interpersonal relationships that lead to the alleviation  and help prevent the relapse of depression. 2. Develop healthy thinking patterns and beliefs about self, others, and the world that lead to the alleviation and help prevent the relapse of  depression. Objective Verbalize an understanding of healthy and unhealthy emotions with the intent of increasing the use of  healthy emotions to guide actions. Target Date: 2024-05-01 Frequency: biWeekly Progress: : individual Objective Learn and implement behavioral  strategies to overcome depression. Target Date: 2024-05-01 Frequency: biWeekly Progress: 50 Modality: individual Related  Interventions 1. Assist the client in developing skills that increase the likelihood of deriving pleasure from  behavioral activation (e.g., assertiveness skills, developing an exercise plan, less internal/more  external focus, increased social involvement); reinforce success. Objective Describe current and past experiences with depression including their impact on functioning and  attempts to resolve it. Target Date: 2024-05-01 Frequency: biWeekly Progress: 50 Modality: individual Related Interventions 1. Encourage the client to share his/her thoughts and feelings of depression; express empathy and  build rapport while identifying primary cognitive, behavioral, interpersonal, or other  contributors to depression.  Objective Learn and implement problem-solving and decision-making skills. Target Date: 2024-05-01 Frequency: biWeekly Progress: 50 Modality: individual Related Interventions 1. Encourage in the client the development of a positive problem orientation in which problems  and solving them are viewed as a natural part of life and not something to be feared, despaired,  or avoided. Objective Identify and replace thoughts and beliefs that support depression. Target Date: 2024-05-01 Frequency: biWeekly Progress: 30 Modality: individual Related Interventions 1. Conduct Cognitive-Behavioral Therapy (see Cognitive Behavior Therapy by Reola Calkins; Overcoming Depression by Agapito Games al.), beginning with helping the client learn the connection among  cognition, depressive feelings, and actions. 2. Facilitate and reinforce the client's shift from biased depressive self-talk and beliefs to realitybased cognitive messages that enhance self-confidence and increase adaptive actions (see  "Positive Self-Talk" in the Adult Psychotherapy Homework Planner by Stephannie Li). Diagnosis Axis  none 296.32 (Major depressive affective disorder, recurrent episode, moderate) - Open -  [Signifier: n/a]   Conditions For Discharge Achievement of treatment goals and objectives Will continue to see the patient at least biweekly and work with her using CBT, Insight oriented approach and EMDR.  Patient approved the treatment plan.  Karell Tukes G Lacorey Brusca, LCSW

## 2023-10-17 ENCOUNTER — Ambulatory Visit: Payer: BC Managed Care – PPO | Admitting: Psychology

## 2023-10-17 DIAGNOSIS — F419 Anxiety disorder, unspecified: Secondary | ICD-10-CM | POA: Diagnosis not present

## 2023-10-17 DIAGNOSIS — F331 Major depressive disorder, recurrent, moderate: Secondary | ICD-10-CM

## 2023-10-17 NOTE — Progress Notes (Signed)
[facility] Counselor/Therapist Progress Note  Patient ID: Jacqueline Fowler, MRN: [id number],    Date: 10/17/2023  Time Spent: 60 minutes  Time in: 10:00  Time out:11:00  Treatment Type: Individual Therapy  Reported Symptoms: depression  Mental Status Exam: Appearance:  Casual     Behavior: Appropriate  Motor: Normal  Speech/Language:  Normal Rate  Affect: Blunt  Mood: pleasant  Thought process: normal  Thought content:   WNL  Sensory/Perceptual disturbances:   WNL  Orientation: oriented to person, place, time/date, and situation  Attention: Good  Concentration: Good  Memory: WNL  Fund of knowledge:  Good  Insight:   Good  Judgment:  Good  Impulse Control: Good   Risk Assessment: Danger to Self:  No Self-injurious Behavior: No Danger to Others: No Duty to Warn:no Physical Aggression / Violence:No  Access to Firearms a concern: No  Gang Involvement:No   Subjective: The patient attended a face-to-face individual therapy session in the office today.  Patient presents as pleasant and cooperative.  The patient reports that she feels like she has been doing better since she has been having therapy.  She reports that she is not getting as depressed anymore and she certainly is not having suicidal thoughts anymore.  We talked about how she is making the changes that she has made and she realizes that she has changed her way of thinking.  She has started catching herself when she is being negative or thinking in a negative way and she changes the thought to something more neutral or positive.  She did that in the session today and I pointed that out to her.  The patient feels like she is getting a lot from the therapy that we are doing and I encouraged her to continue to work on how to change her thoughts and not be so reactionary.  Interventions: Cognitive Behavioral Therapy, EMDR, problem solving  Diagnosis:Anxiety disorder, unspecified type  Major depressive  disorder, recurrent episode, moderate (HCC)  Plan: Client Abilities/Strengths  Intelligent, motivated, insightful  Client Treatment Preferences  Outpatient Individual therapy  Client Statement of Needs  "I need some help with my depression"  Treatment Level  Outpatient Individual therapy  Symptoms  Depressed or irritable mood.:  (Status: Improved). Feelings of hopelessness,  worthlessness, or inappropriate guilt.: (Status: maintained). History of chronic  or recurrent depression for which the client has taken antidepressant medication, been hospitalized, had outpatient treatment, or had a course of electroconvulsive therapy.: (Status:  maintained). Lack of energy.:  (Status: maintained). Low self-esteem.:  (Status: maintained). Poor concentration and indecisiveness.: (Status: maintained). Sleeplessness or hypersomnia.: (Status:  maintained). Social withdrawal.: (Status: maintained).  Problems Addressed  Unipolar Depression, Unipolar Depression    Goals 1. Develop healthy interpersonal relationships that lead to the alleviation  and help prevent the relapse of depression. 2. Develop healthy thinking patterns and beliefs about self, others, and the world that lead to the alleviation and help prevent the relapse of  depression. Objective Verbalize an understanding of healthy and unhealthy emotions with the intent of increasing the use of  healthy emotions to guide actions. Target Date: 2024-05-01 Frequency: biWeekly Progress: : individual Objective Learn and implement behavioral strategies to overcome depression. Target Date: 2024-05-01 Frequency: biWeekly Progress: 60 Modality: individual Related Interventions 1. Assist the client in developing skills that increase the likelihood of deriving pleasure from  behavioral activation (e.g., assertiveness skills, developing an exercise plan, less internal/more  external focus, increased social involvement); reinforce  success. Objective Describe  current and past experiences with depression including their impact on functioning and  attempts to resolve it. Target Date: 2024-05-01 Frequency: biWeekly Progress: 60 Modality: individual Related Interventions 1. Encourage the client to share his/her thoughts and feelings of depression; express empathy and  build rapport while identifying primary cognitive, behavioral, interpersonal, or other  contributors to depression.  Objective Learn and implement problem-solving and decision-making skills. Target Date: 2024-05-01 Frequency: biWeekly Progress: 60 Modality: individual Related Interventions 1. Encourage in the client the development of a positive problem orientation in which problems  and solving them are viewed as a natural part of life and not something to be feared, despaired,  or avoided. Objective Identify and replace thoughts and beliefs that support depression. Target Date: 2024-05-01 Frequency: biWeekly Progress: 30 Modality: individual Related Interventions 1. Conduct Cognitive-Behavioral Therapy (see Cognitive Behavior Therapy by Reola Calkins; Overcoming Depression by Agapito Games al.), beginning with helping the client learn the connection among  cognition, depressive feelings, and actions. 2. Facilitate and reinforce the client's shift from biased depressive self-talk and beliefs to realitybased cognitive messages that enhance self-confidence and increase adaptive actions (see  "Positive Self-Talk" in the Adult Psychotherapy Homework Planner by Stephannie Li). Diagnosis Axis  none 296.32 (Major depressive affective disorder, recurrent episode, moderate) - Open -  [Signifier: n/a]  Conditions For Discharge Achievement of treatment goals and objectives Will continue to see the patient at least biweekly and work with her using CBT, Insight oriented approach and EMDR.  Patient approved the treatment plan.  Annaclaire Walsworth G Larenda Reedy,  LCSW

## 2023-10-30 DIAGNOSIS — I10 Essential (primary) hypertension: Secondary | ICD-10-CM | POA: Diagnosis not present

## 2023-10-30 DIAGNOSIS — K219 Gastro-esophageal reflux disease without esophagitis: Secondary | ICD-10-CM | POA: Diagnosis not present

## 2023-10-30 DIAGNOSIS — F5101 Primary insomnia: Secondary | ICD-10-CM | POA: Diagnosis not present

## 2023-10-30 DIAGNOSIS — M150 Primary generalized (osteo)arthritis: Secondary | ICD-10-CM | POA: Diagnosis not present

## 2023-10-31 ENCOUNTER — Other Ambulatory Visit: Payer: Self-pay | Admitting: Physician Assistant

## 2023-10-31 ENCOUNTER — Ambulatory Visit (INDEPENDENT_AMBULATORY_CARE_PROVIDER_SITE_OTHER): Payer: BC Managed Care – PPO | Admitting: Psychology

## 2023-10-31 DIAGNOSIS — F331 Major depressive disorder, recurrent, moderate: Secondary | ICD-10-CM | POA: Diagnosis not present

## 2023-10-31 DIAGNOSIS — F419 Anxiety disorder, unspecified: Secondary | ICD-10-CM

## 2023-10-31 NOTE — Progress Notes (Unsigned)
[facility] Counselor/Therapist Progress Note  Patient ID: Jacqueline Fowler, MRN: [id number],    Date: 10/31/2023  Time Spent: 60 minutes  Treatment Type: Individual Therapy  Reported Symptoms: depression  Mental Status Exam: Appearance:  Casual     Behavior: Appropriate  Motor: Normal  Speech/Language:  Normal Rate  Affect: Blunt  Mood: depressed  Thought process: normal  Thought content:   WNL  Sensory/Perceptual disturbances:   WNL  Orientation: oriented to person, place, time/date, and situation  Attention: Good  Concentration: Good  Memory: WNL  Fund of knowledge:  Good  Insight:   Good  Judgment:  Good  Impulse Control: Good   Risk Assessment: Danger to Self:  No Self-injurious Behavior: No Danger to Others: No Duty to Warn:no Physical Aggression / Violence:No  Access to Firearms a concern: No  Gang Involvement:No   Subjective: The patient attended a face-to-face individual therapy session via video visit today.  The patient gave verbal consent for the session to be on video on WebEx.  The patient was in her home alone and the therapist was in the office.  The patient reports that she has been having some health issues and is very concerned about these.  She has been having some difficulty in managing her emotions because she felt like she was not being supported by her husband.  We discussed how they worked through that situation and it seems to be a little better now.  The patient is taking 1 thing at a time as we had discussed previously and seems to be doing better with managing things when she does not get overwhelmed by them.  We will continue to address how to manage it motions using cognitive processing and reframing.  Interventions: Cognitive Behavioral Therapy  Diagnosis:No diagnosis found.  Plan: Please see Treatment plan in Therapy charts with target date of 05/01/2022 for goals and progress towards goals.  Will continue to see the patient  at least biweekly and work with her using CBT, Insight oriented approach and EMDR.  Patient approved the treatment plan.  Revella Shelton G Haywood Meinders, LCSW                  Loy Little G Andrick Rust, LCSW

## 2023-11-03 ENCOUNTER — Telehealth: Payer: Self-pay | Admitting: Psychiatry

## 2023-11-03 NOTE — Telephone Encounter (Signed)
Pt LVM @ 9:13a requesting refill of Buspar to   [id number] - [place], [place] - [address] AT [facility] [address] [place] [address] Phone: [phone]  Fax: [phone]   No upcoming appt scheduled.

## 2023-11-03 NOTE — Telephone Encounter (Signed)
Patient has RF available, notified her.

## 2023-11-13 ENCOUNTER — Telehealth: Payer: Self-pay | Admitting: *Deleted

## 2023-11-13 NOTE — Telephone Encounter (Signed)
 Called patient in reference to complaints of having chest pain and pain in arm when getting choked while eating, as well as having sob. Patient also states she does not usually have chest and arm pain during this time. Informed the patient that she should be seen to make sure there are no other issues. Suggested Urgent care if she did not want to go to the ED. Patient states she will call her PCP.

## 2023-11-13 NOTE — Telephone Encounter (Signed)
 Called patient to inform Jacqueline Fowler's recommendations. Informed the patient that she would need to be seen to rule out any cardiac issues. Patient in agreement, although did not say as to whether she would go to urgent care or the ED.

## 2023-11-14 ENCOUNTER — Ambulatory Visit: Payer: BC Managed Care – PPO | Admitting: Psychology

## 2023-11-20 ENCOUNTER — Other Ambulatory Visit: Payer: Self-pay | Admitting: Physician Assistant

## 2023-11-20 DIAGNOSIS — K219 Gastro-esophageal reflux disease without esophagitis: Secondary | ICD-10-CM

## 2023-11-20 DIAGNOSIS — K7460 Unspecified cirrhosis of liver: Secondary | ICD-10-CM

## 2023-11-28 ENCOUNTER — Ambulatory Visit: Payer: BC Managed Care – PPO | Admitting: Psychology

## 2023-12-02 ENCOUNTER — Ambulatory Visit: Payer: BC Managed Care – PPO | Admitting: Psychology

## 2023-12-02 DIAGNOSIS — F331 Major depressive disorder, recurrent, moderate: Secondary | ICD-10-CM

## 2023-12-02 DIAGNOSIS — F419 Anxiety disorder, unspecified: Secondary | ICD-10-CM

## 2023-12-02 NOTE — Progress Notes (Signed)
[facility] Counselor/Therapist Progress Note  Patient ID: Jacqueline Fowler, MRN: [id number],    Date: 12/02/2023  Time Spent: 60 minutes  Time in:  9:02  Time out:  10:02  Treatment Type: Individual Therapy  Reported Symptoms: depression  Mental Status Exam: Appearance:  Casual     Behavior: Appropriate  Motor: Normal  Speech/Language:  Normal Rate  Affect: Blunt  Mood: pleasant  Thought process: normal  Thought content:   WNL  Sensory/Perceptual disturbances:   WNL  Orientation: oriented to person, place, time/date, and situation  Attention: Good  Concentration: Good  Memory: WNL  Fund of knowledge:  Good  Insight:   Good  Judgment:  Good  Impulse Control: Good   Risk Assessment: Danger to Self:  No Self-injurious Behavior: No Danger to Others: No Duty to Warn:no Physical Aggression / Violence:No  Access to Firearms a concern: No  Gang Involvement:No   Subjective: The patient attended a face-to-face individual therapy session in the office today.  The patient presents as pleasant and cooperative.  She does seem to be having some issues with staying on topic a little bit but that may be because it is early for her.  She reports that she feels like she has been doing better with working with herself on being more positive and managing her thoughts better.  She reports that she has not been getting herself very depressed lately and she is doing a much better job of recognizing that being negative is not helpful to her.  She talked today about going to a more holistic doctor so that she can get some help with her fibromyalgia.  She seemed excited about being able to do that.  We continue to use cognitive behavioral therapy to help her reframe her thoughts so that she is not getting as depressed.  She did report that she is following Corie Chiquito to [facility] when she leaves [facility].  Interventions: Cognitive Behavioral Therapy  Diagnosis:Anxiety disorder,  unspecified type  Major depressive disorder, recurrent episode, moderate (HCC)   Plan: Client Abilities/Strengths  Intelligent, motivated, insightful  Client Treatment Preferences  Outpatient Individual therapy  Client Statement of Needs  "I need some help with my depression"  Treatment Level  Outpatient Individual therapy  Symptoms  Depressed or irritable mood.:  (Status: Improved). Feelings of hopelessness,  worthlessness, or inappropriate guilt.: (Status: maintained). History of chronic  or recurrent depression for which the client has taken antidepressant medication, been hospitalized, had outpatient treatment, or had a course of electroconvulsive therapy.: (Status:  maintained). Lack of energy.:  (Status: maintained). Low self-esteem.:  (Status: maintained). Poor concentration and indecisiveness.: (Status: maintained). Sleeplessness or hypersomnia.: (Status:  maintained). Social withdrawal.: (Status: maintained).  Problems Addressed  Unipolar Depression, Unipolar Depression    Goals 1. Develop healthy interpersonal relationships that lead to the alleviation  and help prevent the relapse of depression. 2. Develop healthy thinking patterns and beliefs about self, others, and the world that lead to the alleviation and help prevent the relapse of  depression. Objective Verbalize an understanding of healthy and unhealthy emotions with the intent of increasing the use of  healthy emotions to guide actions. Target Date: 2024-05-01 Frequency: biWeekly Progress: : individual Objective Learn and implement behavioral strategies to overcome depression. Target Date: 2024-05-01 Frequency: biWeekly Progress: 70 Modality: individual Related Interventions 1. Assist the client in developing skills that increase the likelihood of deriving pleasure from  behavioral activation (e.g., assertiveness skills, developing an exercise plan, less internal/more  external focus,  increased  social involvement); reinforce success. Objective Describe current and past experiences with depression including their impact on functioning and  attempts to resolve it. Target Date: 2024-05-01 Frequency: biWeekly Progress: 60 Modality: individual Related Interventions 1. Encourage the client to share his/her thoughts and feelings of depression; express empathy and  build rapport while identifying primary cognitive, behavioral, interpersonal, or other  contributors to depression.  Objective Learn and implement problem-solving and decision-making skills. Target Date: 2024-05-01 Frequency: biWeekly Progress: 60 Modality: individual Related Interventions 1. Encourage in the client the development of a positive problem orientation in which problems  and solving them are viewed as a natural part of life and not something to be feared, despaired,  or avoided. Objective Identify and replace thoughts and beliefs that support depression. Target Date: 2024-05-01 Frequency: biWeekly Progress: 30 Modality: individual Related Interventions 1. Conduct Cognitive-Behavioral Therapy (see Cognitive Behavior Therapy by Reola Calkins; Overcoming Depression by Agapito Games al.), beginning with helping the client learn the connection among  cognition, depressive feelings, and actions. 2. Facilitate and reinforce the client's shift from biased depressive self-talk and beliefs to realitybased cognitive messages that enhance self-confidence and increase adaptive actions (see  "Positive Self-Talk" in the Adult Psychotherapy Homework Planner by Stephannie Li). Diagnosis Axis  none 296.32 (Major depressive affective disorder, recurrent episode, moderate) - Open -  [Signifier: n/a]  Conditions For Discharge Achievement of treatment goals and objectives Will continue to see the patient at least biweekly and work with her using CBT, Insight oriented approach and EMDR.  Patient approved the treatment plan.  Judit Awad G Lam Mccubbins,  LCSW

## 2023-12-10 ENCOUNTER — Telehealth: Payer: Self-pay | Admitting: Physician Assistant

## 2023-12-10 NOTE — Telephone Encounter (Signed)
The pt states that she has burning in the throat and bad taste in the mouth. She is taking protonix 40 mg BID and Pepcid at bedtime with no relief.  She has been counseled on anti reflux precautions and appt made to see Dr Chales Abrahams on 4/1. She will see her PCP in the meantime.

## 2023-12-10 NOTE — Telephone Encounter (Signed)
Left message on machine to call back

## 2023-12-10 NOTE — Telephone Encounter (Signed)
Patient is returning your call stating she never received a call.

## 2023-12-10 NOTE — Telephone Encounter (Signed)
Inbound call from patient requesting a call from the nurse to discuss issues with acid reflux. Please advise.

## 2023-12-12 ENCOUNTER — Ambulatory Visit: Payer: BC Managed Care – PPO | Admitting: Psychology

## 2023-12-12 DIAGNOSIS — F331 Major depressive disorder, recurrent, moderate: Secondary | ICD-10-CM | POA: Diagnosis not present

## 2023-12-12 DIAGNOSIS — F419 Anxiety disorder, unspecified: Secondary | ICD-10-CM

## 2023-12-14 NOTE — Progress Notes (Signed)
[facility] Counselor/Therapist Progress Note  Patient ID: Jacqueline Fowler, MRN: [id number],    Date: 12/12/2023  Time Spent: 60 minutes  Time in:  10:00  Time out:  11:00  Treatment Type: Individual Therapy  Reported Symptoms: depression  Mental Status Exam: Appearance:  Casual     Behavior: Appropriate  Motor: Normal  Speech/Language:  Normal Rate  Affect: Blunt  Mood: pleasant  Thought process: normal  Thought content:   WNL  Sensory/Perceptual disturbances:   WNL  Orientation: oriented to person, place, time/date, and situation  Attention: Good  Concentration: Good  Memory: WNL  Fund of knowledge:  Good  Insight:   Good  Judgment:  Good  Impulse Control: Good   Risk Assessment: Danger to Self:  No Self-injurious Behavior: No Danger to Others: No Duty to Warn:no Physical Aggression / Violence:No  Access to Firearms a concern: No  Gang Involvement:No   Subjective: The patient attended a face-to-face individual therapy session in the office today.  The patient presents as pleasant and cooperative.  The patient reports that she has been doing okay.  She does not seem to be as depressed as she had been in the past.  She reported that one of her issues now is that she gets down on herself because of her weight and her health.  She is in the good bit of pain.  We talked about how to stop the thought before it goes into negative thinking.  We talked about the continual loop that she gets herself into with being down on herself and then justifying how to eat more and giving up on trying to do anything different.  I explained to her that she is going to have to stop it before it goes too far into the negative and gives her the excuse to not do anything different.  We talked about different options for her and she is going to a place [facility] and is going to see how that works.  She does have an appointment in March for that. Interventions: Cognitive  Behavioral Therapy  Diagnosis:Anxiety disorder, unspecified type  Major depressive disorder, recurrent episode, moderate (HCC)   Plan: Client Abilities/Strengths  Intelligent, motivated, insightful  Client Treatment Preferences  Outpatient Individual therapy  Client Statement of Needs  "I need some help with my depression"  Treatment Level  Outpatient Individual therapy  Symptoms  Depressed or irritable mood.:  (Status: Improved). Feelings of hopelessness,  worthlessness, or inappropriate guilt.: (Status: maintained). History of chronic  or recurrent depression for which the client has taken antidepressant medication, been hospitalized, had outpatient treatment, or had a course of electroconvulsive therapy.: (Status:  maintained). Lack of energy.:  (Status: maintained). Low self-esteem.:  (Status: maintained). Poor concentration and indecisiveness.: (Status: maintained). Sleeplessness or hypersomnia.: (Status:  maintained). Social withdrawal.: (Status: maintained).  Problems Addressed  Unipolar Depression, Unipolar Depression    Goals 1. Develop healthy interpersonal relationships that lead to the alleviation  and help prevent the relapse of depression. 2. Develop healthy thinking patterns and beliefs about self, others, and the world that lead to the alleviation and help prevent the relapse of  depression. Objective Verbalize an understanding of healthy and unhealthy emotions with the intent of increasing the use of  healthy emotions to guide actions. Target Date: 2024-05-01 Frequency: biWeekly Progress: : individual Objective Learn and implement behavioral strategies to overcome depression. Target Date: 2024-05-01 Frequency: biWeekly Progress: 70 Modality: individual Related Interventions 1. Assist the client in developing skills that  increase the likelihood of deriving pleasure from  behavioral activation (e.g., assertiveness skills, developing an exercise  plan, less internal/more  external focus, increased social involvement); reinforce success. Objective Describe current and past experiences with depression including their impact on functioning and  attempts to resolve it. Target Date: 2024-05-01 Frequency: biWeekly Progress: 60 Modality: individual Related Interventions 1. Encourage the client to share his/her thoughts and feelings of depression; express empathy and  build rapport while identifying primary cognitive, behavioral, interpersonal, or other  contributors to depression.  Objective Learn and implement problem-solving and decision-making skills. Target Date: 2024-05-01 Frequency: biWeekly Progress: 60 Modality: individual Related Interventions 1. Encourage in the client the development of a positive problem orientation in which problems  and solving them are viewed as a natural part of life and not something to be feared, despaired,  or avoided. Objective Identify and replace thoughts and beliefs that support depression. Target Date: 2024-05-01 Frequency: biWeekly Progress: 30 Modality: individual Related Interventions 1. Conduct Cognitive-Behavioral Therapy (see Cognitive Behavior Therapy by Reola Calkins; Overcoming Depression by Agapito Games al.), beginning with helping the client learn the connection among  cognition, depressive feelings, and actions. 2. Facilitate and reinforce the client's shift from biased depressive self-talk and beliefs to realitybased cognitive messages that enhance self-confidence and increase adaptive actions (see  "Positive Self-Talk" in the Adult Psychotherapy Homework Planner by Stephannie Li). Diagnosis Axis  none 296.32 (Major depressive affective disorder, recurrent episode, moderate) - Open -  [Signifier: n/a]  Conditions For Discharge Achievement of treatment goals and objectives Will continue to see the patient at least biweekly and work with her using CBT, Insight oriented approach and  EMDR.  Patient approved the treatment plan.  Rmoni Keplinger G Johntavius Shepard, LCSW

## 2023-12-25 ENCOUNTER — Other Ambulatory Visit: Payer: Self-pay | Admitting: Gastroenterology

## 2023-12-25 DIAGNOSIS — K219 Gastro-esophageal reflux disease without esophagitis: Secondary | ICD-10-CM

## 2023-12-26 ENCOUNTER — Ambulatory Visit: Payer: BC Managed Care – PPO | Admitting: Psychology

## 2024-01-09 ENCOUNTER — Ambulatory Visit: Payer: BC Managed Care – PPO | Admitting: Psychology

## 2024-01-20 ENCOUNTER — Ambulatory Visit: Payer: BC Managed Care – PPO | Admitting: Psychiatry

## 2024-01-21 ENCOUNTER — Ambulatory Visit: Payer: BC Managed Care – PPO | Admitting: Psychology

## 2024-01-21 DIAGNOSIS — F419 Anxiety disorder, unspecified: Secondary | ICD-10-CM | POA: Diagnosis not present

## 2024-01-21 DIAGNOSIS — F331 Major depressive disorder, recurrent, moderate: Secondary | ICD-10-CM

## 2024-01-22 NOTE — Progress Notes (Signed)
 [facility] Counselor/Therapist Progress Note  Patient ID: Jacqueline Fowler, MRN: [id number],    Date: 01/21/2024  Time Spent: 60 minutes  Time in:  9:00  Time out:  10:00  Treatment Type: Individual Therapy  Reported Symptoms: depression  Mental Status Exam: Appearance:  Casual     Behavior: Appropriate  Motor: Normal  Speech/Language:  Normal Rate  Affect: Blunt  Mood: pleasant  Thought process: normal  Thought content:   WNL  Sensory/Perceptual disturbances:   WNL  Orientation: oriented to person, place, time/date, and situation  Attention: Good  Concentration: Good  Memory: WNL  Fund of knowledge:  Good  Insight:   Good  Judgment:  Good  Impulse Control: Good   Risk Assessment: Danger to Self:  No Self-injurious Behavior: No Danger to Others: No Duty to Warn:no Physical Aggression / Violence:No  Access to Firearms a concern: No  Gang Involvement:No   Subjective: The patient attended a face-to-face individual therapy session in the office today.  The patient presents as pleasant and cooperative.  The patient reports that things have been okay since I saw her last.  She does seem to be a little more negative at this time than she was before and we talked about how easy it is to slide back into negativity.  She still is not as depressed as she has been in the past.  She reports that [facility] and they are working with her on weight loss and how to become healthier.  She seems very excited about this process and feels that it is very important that she figure out a way to get healthier because her lab work seemed to be very out of order.  We talked about how she can stay focused in the people she is working with in that program are offering her a good bit of support and trying to get herself healthier.  The patient is aware that she needs to work with herself on continuing to stay as neutral or positive as possible and not can  go back to having expectations that are unrealistic for expecting other people to give her what she needs.  She was talking briefly about her mother and she reports that her mother has distanced herself some.  I explained to her that the expectation cannot be that she is going to be significantly different than she ever has been and they just recently reconciled.  The patient understood the concepts discussed we talked about her catching herself when she is having unrealistic expectations and we will continue to work on helping her with her depression.  Interventions: Cognitive Behavioral Therapy  Diagnosis:Anxiety disorder, unspecified type  Major depressive disorder, recurrent episode, moderate (HCC)   Plan: Client Abilities/Strengths  Intelligent, motivated, insightful  Client Treatment Preferences  Outpatient Individual therapy  Client Statement of Needs  "I need some help with my depression"  Treatment Level  Outpatient Individual therapy  Symptoms  Depressed or irritable mood.:  (Status: Improved). Feelings of hopelessness,  worthlessness, or inappropriate guilt.: (Status: maintained). History of chronic  or recurrent depression for which the client has taken antidepressant medication, been hospitalized, had outpatient treatment, or had a course of electroconvulsive therapy.: (Status:  maintained). Lack of energy.:  (Status: maintained). Low self-esteem.:  (Status: maintained). Poor concentration and indecisiveness.: (Status: maintained). Sleeplessness or hypersomnia.: (Status:  maintained). Social withdrawal.: (Status: maintained).  Problems Addressed  Unipolar Depression, Unipolar Depression    Goals 1. Develop healthy  interpersonal relationships that lead to the alleviation  and help prevent the relapse of depression. 2. Develop healthy thinking patterns and beliefs about self, others, and the world that lead to the alleviation and help prevent the relapse of   depression. Objective Verbalize an understanding of healthy and unhealthy emotions with the intent of increasing the use of  healthy emotions to guide actions. Target Date: 2024-05-01 Frequency: biWeekly Progress: : individual Objective Learn and implement behavioral strategies to overcome depression. Target Date: 2024-05-01 Frequency: biWeekly Progress: 70 Modality: individual Related Interventions 1. Assist the client in developing skills that increase the likelihood of deriving pleasure from  behavioral activation (e.g., assertiveness skills, developing an exercise plan, less internal/more  external focus, increased social involvement); reinforce success. Objective Describe current and past experiences with depression including their impact on functioning and  attempts to resolve it. Target Date: 2024-05-01 Frequency: biWeekly Progress: 60 Modality: individual Related Interventions 1. Encourage the client to share his/her thoughts and feelings of depression; express empathy and  build rapport while identifying primary cognitive, behavioral, interpersonal, or other  contributors to depression.  Objective Learn and implement problem-solving and decision-making skills. Target Date: 2024-05-01 Frequency: biWeekly Progress: 60 Modality: individual Related Interventions 1. Encourage in the client the development of a positive problem orientation in which problems  and solving them are viewed as a natural part of life and not something to be feared, despaired,  or avoided. Objective Identify and replace thoughts and beliefs that support depression. Target Date: 2024-05-01 Frequency: biWeekly Progress: 30 Modality: individual Related Interventions 1. Conduct Cognitive-Behavioral Therapy (see Cognitive Behavior Therapy by Reola Calkins; Overcoming Depression by Agapito Games al.), beginning with helping the client learn the connection among  cognition, depressive feelings, and  actions. 2. Facilitate and reinforce the client's shift from biased depressive self-talk and beliefs to realitybased cognitive messages that enhance self-confidence and increase adaptive actions (see  "Positive Self-Talk" in the Adult Psychotherapy Homework Planner by Stephannie Li). Diagnosis Axis  none 296.32 (Major depressive affective disorder, recurrent episode, moderate) - Open -  [Signifier: n/a]  Conditions For Discharge Achievement of treatment goals and objectives Will continue to see the patient at least biweekly and work with her using CBT, Insight oriented approach and EMDR.  Patient approved the treatment plan.  Acey Woodfield G Summer Mccolgan, LCSW

## 2024-01-23 ENCOUNTER — Ambulatory Visit: Payer: BC Managed Care – PPO | Admitting: Psychology

## 2024-02-06 ENCOUNTER — Ambulatory Visit: Payer: BC Managed Care – PPO | Admitting: Psychology

## 2024-02-10 ENCOUNTER — Ambulatory Visit: Payer: BC Managed Care – PPO | Admitting: Gastroenterology

## 2024-02-22 ENCOUNTER — Other Ambulatory Visit: Payer: Self-pay | Admitting: Physician Assistant

## 2024-02-22 DIAGNOSIS — K7460 Unspecified cirrhosis of liver: Secondary | ICD-10-CM

## 2024-02-22 DIAGNOSIS — K219 Gastro-esophageal reflux disease without esophagitis: Secondary | ICD-10-CM

## 2024-03-05 ENCOUNTER — Telehealth: Payer: Self-pay

## 2024-03-05 ENCOUNTER — Ambulatory Visit (INDEPENDENT_AMBULATORY_CARE_PROVIDER_SITE_OTHER): Payer: BC Managed Care – PPO | Admitting: Psychology

## 2024-03-05 DIAGNOSIS — F50811 Binge eating disorder, moderate: Secondary | ICD-10-CM | POA: Diagnosis not present

## 2024-03-05 DIAGNOSIS — F419 Anxiety disorder, unspecified: Secondary | ICD-10-CM

## 2024-03-05 DIAGNOSIS — F331 Major depressive disorder, recurrent, moderate: Secondary | ICD-10-CM

## 2024-03-05 NOTE — Progress Notes (Signed)
 [facility] Counselor/Therapist Progress Note  Patient ID: Jacqueline Fowler, MRN: [id number],    Date: 03/05/2024  Time Spent: 58 minutes  Time in:  10:07  Time out:  11:05  Treatment Type: Individual Therapy  Reported Symptoms: depression  Mental Status Exam: Appearance:  Casual     Behavior: Appropriate  Motor: Normal  Speech/Language:  Normal Rate  Affect: Blunt  Mood: sad  Thought process: normal  Thought content:   WNL  Sensory/Perceptual disturbances:   WNL  Orientation: oriented to person, place, time/date, and situation  Attention: Good  Concentration: Good  Memory: WNL  Fund of knowledge:  Good  Insight:   Good  Judgment:  Good  Impulse Control: Good   Risk Assessment: Danger to Self:  No Self-injurious Behavior: No Danger to Others: No Duty to Warn:no Physical Aggression / Violence:No  Access to Firearms a concern: No  Gang Involvement:No   Subjective: The patient attended a face-to-face individual therapy session via video visit today.  The patient gave verbal consent for the session to be on caregility and she is aware of the limitations of telehealth.  The patient was in her home alone and the therapist was in the office.  The patient presents as very sad today.  She reports that she has been struggling over the last couple months.  She states that she has been very emotional and she has not been handling things very well.  She is having some issues with her health and she reports that her back is hurting a lot.  We started talking about what changes there have been and it seems that she has been more emotional possibly because she is going through food withdrawal and also because she has made massive changes in her diet and with what she is taking medication wise over the last couple months.  She is now on a GLP-1 and is making her nauseated and she is struggling with that.  In addition she had an issue with her future daughter-in-law and we processed  what happened with that and that it really was not about her was really more about the future daughter-in-law being insecure about the relationship with her son.  We talked about how is just normal human behavior to have difficulties with her mother-in-law or daughter-in-law when the person that you are competing over is the same person.  We talked about how she is going to have to be the one who handles that because her daughter-in-law is 30 years old.  The patient understood everything we talked about and did feel like this ring true for her.  She has changed her diet a great deal and she has lost 13 pounds however she is not feeling great.  I recommended that she talk with the doctor that prescribed the medicine and that she gather a little more information about how long the symptoms might last. Interventions: Cognitive Behavioral Therapy  Diagnosis:Anxiety disorder, unspecified type  Major depressive disorder, recurrent episode, moderate (HCC)  Moderate binge-eating disorder   Plan: Client Abilities/Strengths  Intelligent, motivated, insightful  Client Treatment Preferences  Outpatient Individual therapy  Client Statement of Needs  "I need some help with my depression"  Treatment Level  Outpatient Individual therapy  Symptoms  Depressed or irritable mood.:  (Status: Improved). Feelings of hopelessness,  worthlessness, or inappropriate guilt.: (Status: maintained). History of chronic  or recurrent depression for which the client has taken antidepressant medication, been hospitalized, had outpatient treatment, or had a course of  electroconvulsive therapy.: (Status:  maintained). Lack of energy.:  (Status: maintained). Low self-esteem.:  (Status: maintained). Poor concentration and indecisiveness.: (Status: maintained). Sleeplessness or hypersomnia.: (Status:  maintained). Social withdrawal.: (Status: maintained).  Problems Addressed  Unipolar Depression, Unipolar Depression     Goals 1. Develop healthy interpersonal relationships that lead to the alleviation  and help prevent the relapse of depression. 2. Develop healthy thinking patterns and beliefs about self, others, and the world that lead to the alleviation and help prevent the relapse of  depression. Objective Verbalize an understanding of healthy and unhealthy emotions with the intent of increasing the use of  healthy emotions to guide actions. Target Date: 2024-05-01 Frequency: biWeekly Progress: : individual Objective Learn and implement behavioral strategies to overcome depression. Target Date: 2024-05-01 Frequency: biWeekly Progress: 70 Modality: individual Related Interventions 1. Assist the client in developing skills that increase the likelihood of deriving pleasure from  behavioral activation (e.g., assertiveness skills, developing an exercise plan, less internal/more  external focus, increased social involvement); reinforce success. Objective Describe current and past experiences with depression including their impact on functioning and  attempts to resolve it. Target Date: 2024-05-01 Frequency: biWeekly Progress: 60 Modality: individual Related Interventions 1. Encourage the client to share his/her thoughts and feelings of depression; express empathy and  build rapport while identifying primary cognitive, behavioral, interpersonal, or other  contributors to depression.  Objective Learn and implement problem-solving and decision-making skills. Target Date: 2024-05-01 Frequency: biWeekly Progress: 60 Modality: individual Related Interventions 1. Encourage in the client the development of a positive problem orientation in which problems  and solving them are viewed as a natural part of life and not something to be feared, despaired,  or avoided. Objective Identify and replace thoughts and beliefs that support depression. Target Date: 2024-05-01 Frequency:  biWeekly Progress: 30 Modality: individual Related Interventions 1. Conduct Cognitive-Behavioral Therapy (see Cognitive Behavior Therapy by Kerrie Peek; Overcoming Depression by Duke Gibbons al.), beginning with helping the client learn the connection among  cognition, depressive feelings, and actions. 2. Facilitate and reinforce the client's shift from biased depressive self-talk and beliefs to realitybased cognitive messages that enhance self-confidence and increase adaptive actions (see  "Positive Self-Talk" in the Adult Psychotherapy Homework Planner by Beacher Bottoms). Diagnosis Axis  none 296.32 (Major depressive affective disorder, recurrent episode, moderate) - Open -  [Signifier: n/a]  Conditions For Discharge Achievement of treatment goals and objectives Will continue to see the patient at least biweekly and work with her using CBT, Insight oriented approach and EMDR.  Patient approved the treatment plan.  Spike Desilets G Eiley Mcginnity, LCSW

## 2024-03-05 NOTE — Telephone Encounter (Signed)
 Pt made aware of recommendations for MRI. Pt stated that she would call us  back to schedule to due financial reasons right now. Pt verbalized understanding with all questions answered.

## 2024-03-05 NOTE — Telephone Encounter (Signed)
-----   Message from Nurse Su Ellison sent at 03/05/2024  8:03 AM EDT -----  ----- Message ----- From: Anthonette Kinsman, RN Sent: 03/05/2024  12:00 AM EDT To: Anthonette Kinsman, RN  Repeat MRI in 6 months due to Hemangioma

## 2024-03-10 ENCOUNTER — Telehealth: Payer: Self-pay

## 2024-03-10 NOTE — Telephone Encounter (Signed)
 Reminder was received in Epic for repeat MRI.  Pt was contacted the other day also in regard  to the reminder.  Separate phone note. Pt stated that she is requesting to have the MRI done more around the 1 year mark due to insurance and financial reasons. Pt would like to know Dr. Venice Gillis recommendations on waiting  until the 1 year mark vs the 6 months.  Please review and advise.

## 2024-03-10 NOTE — Telephone Encounter (Signed)
-----   Message from Nurse [place] B sent at 03/09/2024  1:05 PM EDT ----- Regarding: FW: MRI at [facility] in 6 months Jacqueline Fowler patient. Thanks ----- Message ----- From: Anthonette Kinsman, RN Sent: 03/08/2024  12:00 AM EDT To: Anthonette Kinsman, RN Subject: MRI at [facility] in 6 months            Patient wants to have a repeat MRI in 6 mos with [facility] in [facility] in 6 months as ordered via Dr. Venice Fowler.

## 2024-03-10 NOTE — Telephone Encounter (Signed)
 AFP has been normal MRI likely benign lesion  It will be okay to wait until 1 year mark to repeat MRI.  Please ask her to get it done in 1 year for sure RG

## 2024-03-12 NOTE — Telephone Encounter (Signed)
 Pt made aware of Dr. Venice Gillis recommendations: Reminder was placed in Epic for 6 months. Pt made aware.  Pt verbalized understanding with all questions answered.

## 2024-03-19 ENCOUNTER — Ambulatory Visit: Payer: BC Managed Care – PPO | Admitting: Psychology

## 2024-03-24 ENCOUNTER — Telehealth: Payer: Self-pay | Admitting: Gastroenterology

## 2024-03-24 NOTE — Telephone Encounter (Signed)
 Pt stated that she has been having Right upper abdomen pain/Right flank area since Monday ( 3 days ). Pt stated that the pain comes on all of sutton and then goes away. ( Severe/sharp pain). Pt stated that the pain stops her in her tracks the pain is so intense.  Last BM today ( formed/normal) no nausea, no fever. No UTI symptoms. Please review and advise

## 2024-03-24 NOTE — Telephone Encounter (Signed)
 Inbound call from patient, states she is having pain towards her liver, patient believes it may be her cirrhosis, would like to speak with a nurse to know if there is something she should do or should she go to the hospital.

## 2024-03-25 NOTE — Telephone Encounter (Signed)
 Check CBC, CMP, INR, AFP Proceed with MRI abdomen with and without contrast (just as before) RE: normal previous MRI FU thereafter in GI clinic RG

## 2024-03-26 NOTE — Telephone Encounter (Signed)
 Pt stated that her symptoms have calmed down and that she request to monitor her symptoms over the weekend and call back on Monday. Pt requested that we hold off on ordering labs and MRI for her. Pt stated that she will call back on Monday and notify us  if she wants to proceed with the labs and MRI . Pt verbalized understanding with all questions answered.

## 2024-04-02 ENCOUNTER — Ambulatory Visit: Payer: BC Managed Care – PPO | Admitting: Psychology

## 2024-04-02 DIAGNOSIS — F331 Major depressive disorder, recurrent, moderate: Secondary | ICD-10-CM | POA: Diagnosis not present

## 2024-04-02 DIAGNOSIS — F419 Anxiety disorder, unspecified: Secondary | ICD-10-CM

## 2024-04-05 NOTE — Progress Notes (Signed)
 [facility] Counselor/Therapist Progress Note  Patient ID: CAMISHA SREY, MRN: [id number],    Date: 04/02/2024  Time Spent: 60 minutes  Time in:  10:05  Time out:  11:05  Treatment Type: Individual Therapy  Reported Symptoms: depression  Mental Status Exam: Appearance:  Casual     Behavior: Appropriate  Motor: Normal  Speech/Language:  Normal Rate  Affect: Blunt  Mood: pleasant  Thought process: normal  Thought content:   WNL  Sensory/Perceptual disturbances:   WNL  Orientation: oriented to person, place, time/date, and situation  Attention: Good  Concentration: Good  Memory: WNL  Fund of knowledge:  Good  Insight:   Good  Judgment:  Good  Impulse Control: Good   Risk Assessment: Danger to Self:  No Self-injurious Behavior: No Danger to Others: No Duty to Warn:no Physical Aggression / Violence:No  Access to Firearms a concern: No  Gang Involvement:No   Subjective: The patient attended a face-to-face individual therapy session today.  The patient presents as pleasant and cooperative.  She seems to be doing much better today than she was the last time I saw her on video.  The patient reports that she is feeling better and not quite as in pain or irritable.  We talked about how she has been doing with her depression and she seems to be doing a better job with her thought processes lately.  She does not seem to be as depressive and she is doing well with trying not to get herself into a negative pattern of thinking.  Interventions: Cognitive Behavioral Therapy  Diagnosis:Anxiety disorder, unspecified type  Major depressive disorder, recurrent episode, moderate (HCC)   Plan: Client Abilities/Strengths  Intelligent, motivated, insightful  Client Treatment Preferences  Outpatient Individual therapy  Client Statement of Needs  "I need some help with my depression"  Treatment Level  Outpatient Individual therapy  Symptoms  Depressed or irritable mood.:   (Status: Improved). Feelings of hopelessness,  worthlessness, or inappropriate guilt.: (Status: maintained). History of chronic  or recurrent depression for which the client has taken antidepressant medication, been hospitalized, had outpatient treatment, or had a course of electroconvulsive therapy.: (Status:  maintained). Lack of energy.:  (Status: maintained). Low self-esteem.:  (Status: maintained). Poor concentration and indecisiveness.: (Status: maintained). Sleeplessness or hypersomnia.: (Status:  maintained). Social withdrawal.: (Status: maintained).  Problems Addressed  Unipolar Depression, Unipolar Depression    Goals 1. Develop healthy interpersonal relationships that lead to the alleviation  and help prevent the relapse of depression. 2. Develop healthy thinking patterns and beliefs about self, others, and the world that lead to the alleviation and help prevent the relapse of  depression. Objective Verbalize an understanding of healthy and unhealthy emotions with the intent of increasing the use of  healthy emotions to guide actions. Target Date: 2024-05-01 Frequency: biWeekly Progress: 70 Modality: individual Objective Learn and implement behavioral strategies to overcome depression. Target Date: 2024-05-01 Frequency: biWeekly Progress: 70 Modality: individual Related Interventions 1. Assist the client in developing skills that increase the likelihood of deriving pleasure from  behavioral activation (e.g., assertiveness skills, developing an exercise plan, less internal/more  external focus, increased social involvement); reinforce success. Objective Describe current and past experiences with depression including their impact on functioning and  attempts to resolve it. Target Date: 2024-05-01 Frequency: biWeekly Progress: 60 Modality: individual Related Interventions 1. Encourage the client to share his/her thoughts and feelings of depression; express empathy and   build rapport while identifying primary cognitive, behavioral, interpersonal, or other  contributors  to depression.  Objective Learn and implement problem-solving and decision-making skills. Target Date: 2024-05-01 Frequency: biWeekly Progress: 60 Modality: individual Related Interventions 1. Encourage in the client the development of a positive problem orientation in which problems  and solving them are viewed as a natural part of life and not something to be feared, despaired,  or avoided. Objective Identify and replace thoughts and beliefs that support depression. Target Date: 2024-05-01 Frequency: biWeekly Progress: 30 Modality: individual Related Interventions 1. Conduct Cognitive-Behavioral Therapy (see Cognitive Behavior Therapy by Kerrie Peek; Overcoming Depression by Duke Gibbons al.), beginning with helping the client learn the connection among  cognition, depressive feelings, and actions. 2. Facilitate and reinforce the client's shift from biased depressive self-talk and beliefs to realitybased cognitive messages that enhance self-confidence and increase adaptive actions (see  "Positive Self-Talk" in the Adult Psychotherapy Homework Planner by Beacher Bottoms). Diagnosis Axis  none 296.32 (Major depressive affective disorder, recurrent episode, moderate) - Open -  [Signifier: n/a]  Conditions For Discharge Achievement of treatment goals and objectives Will continue to see the patient at least biweekly and work with her using CBT, Insight oriented approach and EMDR.  Patient approved the treatment plan.  Tarez Bowns G Eloyce Bultman, LCSW

## 2024-04-21 ENCOUNTER — Other Ambulatory Visit: Payer: Self-pay | Admitting: Physician Assistant

## 2024-04-23 ENCOUNTER — Telehealth: Payer: Self-pay | Admitting: Gastroenterology

## 2024-04-23 NOTE — Telephone Encounter (Signed)
 PT is requesting to speak with the nurse regarding her symptoms. She feels she has an intestinal infection and wants to know if she can have antibiotics or should she come in for ov. Please advise.

## 2024-04-23 NOTE — Telephone Encounter (Signed)
 Pt stated that she has been having [place] yellow  diarrhea along with generalized abdominal pain ( cramping )  for the past 2 and 1/2 days. Pt stated that she has been having diarrhea several times every hour. One episode of emesis this AM . No fever,  Pt was recommended BRAT diet and Imodium AD OTC Please review and advise

## 2024-04-24 ENCOUNTER — Encounter (HOSPITAL_BASED_OUTPATIENT_CLINIC_OR_DEPARTMENT_OTHER): Payer: Self-pay

## 2024-04-24 ENCOUNTER — Ambulatory Visit (HOSPITAL_BASED_OUTPATIENT_CLINIC_OR_DEPARTMENT_OTHER)
Admission: RE | Admit: 2024-04-24 | Discharge: 2024-04-24 | Disposition: A | Discharge status: Home / Self Care | Source: Ambulatory Visit

## 2024-04-24 VITALS — BP 131/83 | HR 78 | Temp 98.4°F | Resp 18

## 2024-04-24 DIAGNOSIS — R1084 Generalized abdominal pain: Secondary | ICD-10-CM

## 2024-04-24 DIAGNOSIS — R197 Diarrhea, unspecified: Secondary | ICD-10-CM

## 2024-04-24 MED ORDER — AZITHROMYCIN 250 MG PO TABS: 250.0000 mg | ORAL_TABLET | Freq: Every day | ORAL | 0 refills | Status: DC

## 2024-04-24 MED ORDER — TRIAMCINOLONE ACETONIDE 40 MG/ML IJ SUSP
40.0000 mg | Freq: Once | INTRAMUSCULAR | Status: AC
  Administered 2024-04-24: 40 mg via INTRAMUSCULAR

## 2024-04-24 MED ORDER — ONDANSETRON 4 MG PO TBDP: 4.0000 mg | ORAL_TABLET | Freq: Three times a day (TID) | ORAL | 0 refills | Status: AC | PRN

## 2024-04-24 NOTE — Discharge Instructions (Addendum)
 As we talked about this problem could be associated with your liver.  Recommendations are to get a CT scan if problem persist For now we will go ahead and treat for potential intestinal infection. Take the antibiotics as prescribed. I am prescribing Zofran  for nausea and vomiting as needed. Steroid injection given here for your lower back pain If your symptoms worsen over the next 24 hours please go to the ER.  Otherwise follow-up with your doctor as planned.  Make sure you are sipping fluids and staying hydrated. Bland diet for now if tolerated

## 2024-04-24 NOTE — ED Triage Notes (Signed)
 Pt c/o sever stomach pain with diarrhea x 3 days, stool yellow in cover been taking imodium and tylenol . Pt also having back pain she did take Oxycodone this morning wanting to see if she can get prednisone pills

## 2024-04-25 NOTE — Telephone Encounter (Signed)
 If still with problems, check stool for GI path (including c diff) OV with JLL or any APP RG

## 2024-04-26 ENCOUNTER — Other Ambulatory Visit: Payer: Self-pay

## 2024-04-26 DIAGNOSIS — R197 Diarrhea, unspecified: Secondary | ICD-10-CM

## 2024-04-26 DIAGNOSIS — R109 Unspecified abdominal pain: Secondary | ICD-10-CM

## 2024-04-26 NOTE — Telephone Encounter (Signed)
 Pt made aware of Dr. Venice Gillis recommendations: Pt was scheduled to see Santina Cull PA on 05/03/2024 at 1:30 PM. Pt made aware.  [employer] stool kit prepared and pt notified that it was on the 2nd floor for pick up.  Pt verbalized understanding with all questions answered.

## 2024-04-26 NOTE — ED Provider Notes (Signed)
 [facility]    CSN: [id number] Arrival date & time: 04/24/24  1314      History   Chief Complaint Chief Complaint  Patient presents with   Abdominal Pain    Been having severe stomach pain and diarrhea for 3 days. Also, severe back pain. - Entered by patient    HPI BRIEANN OSINSKI is a 53 y.o. female.   Patient is a 53 year old female presents today with severe abdominal cramping, diarrhea for the past couple days.  The problem has been constant.  She has had yellow loose stools.  Denies any fever.  Has been taking Imodium and Tylenol  with some relief.  Has chronic back pain and back pain has flared this up also.  She took oxycodone for this.  Some nausea without vomiting.NO recent traveling or sick contacts. No bad food that she can think of. Hx of Cirrhosis and Cholecystectomy. Denies any urinary complaints.    Abdominal Pain   Past Medical History:  Diagnosis Date   Anxiety    B12 deficiency    Back pain    Chest pain    Cirrhosis of liver (HCC)    Depression    Fatty liver    Gallbladder problem    Gallstones 2004   GERD (gastroesophageal reflux disease)    History of stomach ulcers    IBS (irritable bowel syndrome)    Joint pain    Obesity    Pre-diabetes    Swallowing difficulty    UTI (urinary tract infection)    Vitamin D  deficiency     Patient Active Problem List   Diagnosis Date Noted   Depression with anxiety 09/25/2022   Elevated AST (SGOT) 08/28/2022   Other hyperlipidemia 08/28/2022   SOBOE (shortness of breath on exertion) 08/14/2022   Prediabetes 08/14/2022   Vitamin D  deficiency 08/14/2022   B12 deficiency 08/14/2022   Depression screening 08/14/2022   Other fatigue 07/31/2022   Class 3 severe obesity with serious comorbidity and body mass index (BMI) of 45.0 to 49.9 in adult 07/31/2022   Hepatic cirrhosis (HCC) 03/12/2022   GERD (gastroesophageal reflux disease) 03/12/2022   Hx of adenomatous colonic polyps 03/12/2022   GAD  (generalized anxiety disorder) 12/15/2018   Mood disorder (HCC) 12/15/2018    Past Surgical History:  Procedure Laterality Date   BREAST BIOPSY Right    age 89 or 60 cyst that was removed   CHOLECYSTECTOMY  2004   COLONOSCOPY  08/07/2009   Small internal hemorrhoids. Otherwise normal colonoscopy to TI.    ESOPHAGOGASTRODUODENOSCOPY  05/31/2008   Esophageal stricture status post dilation. Mild gastritis.    esophagus stretching     IUD REMOVAL     REPAIR RECTOCELE     01/22/22   TOTAL VAGINAL HYSTERECTOMY     tummy tuck  03/20/2020   UPPER GASTROINTESTINAL ENDOSCOPY      OB History   No obstetric history on file.      Home Medications    Prior to Admission medications   Medication Sig Start Date End Date Taking? Authorizing Provider  azithromycin  (ZITHROMAX ) 250 MG tablet Take 1 tablet (250 mg total) by mouth daily. Take first 2 tablets together, then 1 every day until finished. 04/24/24  Yes Kalisi Bevill A, FNP  brexpiprazole  (REXULTI ) 2 MG TABS tablet Take 1 tablet (2 mg total) by mouth daily. 07/22/23 04/24/24 Yes Selinda Dales, PMHNP  celecoxib (CELEBREX) 200 MG capsule Take 200 mg by mouth daily. 04/21/24  Yes [provider]  clonazePAM  (KLONOPIN ) 1 MG tablet Take 1/2-1 tablet twice daily as needed for panic 06/21/23  Yes Roselyn Connor P, PMHNP  cyclobenzaprine (FLEXERIL) 5 MG tablet Take 5 mg by mouth 3 (three) times daily. 06/27/22  Yes [provider]  dicyclomine  (BENTYL ) 20 MG tablet TAKE 1 TABLET (20 MG TOTAL) BY MOUTH 4 (FOUR) TIMES DAILY - BEFORE MEALS AND AT BEDTIME. 10/31/23  Yes Lemmon, Kathy Parker, PA  gabapentin (NEURONTIN) 300 MG capsule Take 300 mg by mouth daily. 04/21/24  Yes [provider]  lamoTRIgine  (LAMICTAL ) 100 MG tablet Take 1 tablet (100 mg total) by mouth daily. 07/22/23  Yes Selinda Dales, PMHNP  metFORMIN  (GLUCOPHAGE -XR) 500 MG 24 hr tablet SMARTSIG:2 Tablet(s) By Mouth Every Evening 04/11/24  Yes [provider]  ondansetron  (ZOFRAN -ODT) 4 MG disintegrating tablet Take 1 tablet (4 mg total) by mouth every 8 (eight) hours as needed for nausea or vomiting. 04/24/24  Yes Auburn Hester A, FNP  Oxycodone HCl 10 MG TABS Take 10 mg by mouth daily as needed. 03/31/24  Yes [provider]  pantoprazole  (PROTONIX ) 40 MG tablet TAKE 1 TABLET BY MOUTH 2 TIMES DAILY. TAKE HALF HOUR BEFORE BREAKFAST AND HALF HOUR BEFORE SUPPER. 12/25/23  Yes Lajuan Pila, MD  propranolol  (INDERAL ) 10 MG tablet TAKE 1 TO 2 TABLETS BY MOUTH TWICE A DAY AS NEEDED FOR ANXIETY Patient taking differently: Take 10 mg by mouth 2 (two) times daily. TAKE 1 TO 2 TABLETS BY MOUTH TWICE A DAY AS NEEDED FOR ANXIETY 04/21/23  Yes Selinda Dales, PMHNP  SEMAGLUTIDE-WEIGHT MANAGEMENT Chicken Inject into the skin.   Yes [provider]  sertraline  (ZOLOFT ) 100 MG tablet Take 1 tablet (100 mg total) by mouth in the morning and at bedtime. 07/22/23 04/24/24 Yes Selinda Dales, PMHNP  acetaminophen  (TYLENOL ) 500 MG tablet Take 500 mg by mouth as needed. Patient not taking: Reported on 08/04/2023    [provider]  busPIRone  (BUSPAR ) 30 MG tablet Take 1 tablet (30 mg total) by mouth 2 (two) times daily. 07/22/23 01/18/24  Selinda Dales, PMHNP  Cholecalciferol (VITAMIN D3 PO) Take by mouth.    [provider]  COLLAGEN PO Take by mouth. Patient not taking: Reported on 08/04/2023    [provider]  famotidine  (PEPCID ) 20 MG tablet TAKE 1 TABLET BY MOUTH EVERYDAY AT BEDTIME 08/15/23   Lajuan Pila, MD  famotidine  (PEPCID ) 40 MG tablet TAKE 1 TABLET (40 MG TOTAL) BY MOUTH 2 (TWO) TIMES DAILY. EVERY MORNING AND AT BEDTIME 02/23/24   Lajuan Pila, MD  ibuprofen (ADVIL) 100 MG/5ML suspension Take 200 mg by mouth every 4 (four) hours as needed.    [provider]  loperamide (IMODIUM) 2 MG capsule Take by mouth as needed for diarrhea or loose stools.    [provider]  Multiple  Vitamins-Minerals (CENTRUM SILVER 50+WOMEN PO) Take by mouth.    [provider]  nortriptyline (PAMELOR) 10 MG capsule 1 capsule Orally Three times a day    [provider]  oxyCODONE-acetaminophen  (PERCOCET/ROXICET) 5-325 MG tablet Take 1 tablet by mouth every 6 (six) hours as needed. 06/27/22   [provider]  phentermine (ADIPEX-P) 37.5 MG tablet Take 37.5 mg by mouth daily before breakfast. Patient not taking: Reported on 08/04/2023    [provider]  Pyridoxine HCl (VITAMIN B-6 PO) Take by mouth.    [provider]    Family History Family History  Problem Relation Age  of Onset   Depression Mother    Anxiety disorder Mother    Anxiety disorder Father    Depression Father    Heart disease Father    Depression Son    Colon cancer Neg Hx    Esophageal cancer Neg Hx    Rectal cancer Neg Hx    Stomach cancer Neg Hx     Social History Social History   Tobacco Use   Smoking status: Former   Smokeless tobacco: Never   Tobacco comments:    quit 2001 smoking  Vaping Use   Vaping status: Never Used  Substance Use Topics   Alcohol use: Not Currently    Alcohol/week: 1.0 standard drink of alcohol    Types: 1 Glasses of wine per week    Comment: ocassionally   Drug use: Never     Allergies   Patient has no known allergies.   Review of Systems Review of Systems  Gastrointestinal:  Positive for abdominal pain.     Physical Exam Triage Vital Signs ED Triage Vitals  Encounter Vitals Group     BP 04/24/24 1331 131/83     Girls Systolic BP Percentile --      Girls Diastolic BP Percentile --      Boys Systolic BP Percentile --      Boys Diastolic BP Percentile --      Pulse Rate 04/24/24 1331 78     Resp 04/24/24 1331 18     Temp 04/24/24 1331 98.4 F (36.9 C)     Temp Source 04/24/24 1331 Oral     SpO2 04/24/24 1331 95 %     Weight --      Height --      Head Circumference --      Peak Flow --      Pain Score  04/24/24 1327 8     Pain Loc --      Pain Education --      Exclude from Growth Chart --    No data found.  Updated Vital Signs BP 131/83 (BP Location: Right Arm)   Pulse 78   Temp 98.4 F (36.9 C) (Oral)   Resp 18   LMP 05/15/2021 (Approximate)   SpO2 95%   Visual Acuity Right Eye Distance:   Left Eye Distance:   Bilateral Distance:    Right Eye Near:   Left Eye Near:    Bilateral Near:     Physical Exam Vitals and nursing note reviewed.  Constitutional:      General: She is not in acute distress.    Appearance: Normal appearance. She is not ill-appearing, toxic-appearing or diaphoretic.  Pulmonary:     Effort: Pulmonary effort is normal.  Abdominal:     Tenderness: There is generalized abdominal tenderness.     Comments: Appears uncomfortable    Neurological:     Mental Status: She is alert.   Psychiatric:        Mood and Affect: Mood normal.      UC Treatments / Results  Labs (all labs ordered are listed, but only abnormal results are displayed) Labs Reviewed - No data to display  EKG   Radiology No results found.  Procedures Procedures (including critical care time)  Medications Ordered in UC Medications  triamcinolone  acetonide (KENALOG-40) injection 40 mg (40 mg Intramuscular Given 04/24/24 1400)    Initial Impression / Assessment and Plan / UC Course  I have reviewed the triage vital signs and the nursing notes.  Pertinent labs & imaging results that were available during my care of the patient were reviewed by me and considered in my medical decision making (see chart for details).     Generalized abdominal pain with diarrhea. Spoke  with pt about liver being the possible cause for her abdominal pain and diarrhea. Was highly recommended that she go to the ER for CT scan and blood work. Pt worried about the cost and would like to be treated for potential GI infection. Spoke about this may not be the cause and unsure of what specifically we  could be treating. Pt understands but would like to try and see and if worse or no improvement she agrees to go to the ER. Prescribed Z pac and Zofran  for nausea.  Steroid injection given here as requested for her lower back pain.  Strict return and ER precautions given.  Make sure that she is sipping fluid, specifically electrolytes and bland diet as tolerated.   Final Clinical Impressions(s) / UC Diagnoses   Final diagnoses:  Diarrhea of presumed infectious origin  Generalized abdominal pain     Discharge Instructions      As we talked about this problem could be associated with your liver.  Recommendations are to get a CT scan if problem persist For now we will go ahead and treat for potential intestinal infection. Take the antibiotics as prescribed. I am prescribing Zofran  for nausea and vomiting as needed. Steroid injection given here for your lower back pain If your symptoms worsen over the next 24 hours please go to the ER.  Otherwise follow-up with your doctor as planned.  Make sure you are sipping fluids and staying hydrated. Bland diet for now if tolerated     ED Prescriptions     Medication Sig Dispense Auth. Provider   azithromycin  (ZITHROMAX ) 250 MG tablet Take 1 tablet (250 mg total) by mouth daily. Take first 2 tablets together, then 1 every day until finished. 6 tablet Trever Streater A, FNP   ondansetron  (ZOFRAN -ODT) 4 MG disintegrating tablet Take 1 tablet (4 mg total) by mouth every 8 (eight) hours as needed for nausea or vomiting. 20 tablet Landa Pine, FNP      PDMP not reviewed this encounter.   Landa Pine, FNP 04/26/24 [phone]

## 2024-04-30 ENCOUNTER — Ambulatory Visit: Payer: Self-pay | Admitting: Psychology

## 2024-05-03 ENCOUNTER — Ambulatory Visit: Admitting: Physician Assistant

## 2024-05-10 NOTE — Telephone Encounter (Signed)
 Spoke with Pt. Pt was notified that no blood work was ordered.  Pt verbalized understanding with all questions answered.

## 2024-05-17 NOTE — Telephone Encounter (Signed)
 Jacqueline Fowler looks like you have been speaking with this pt.

## 2024-05-18 NOTE — Telephone Encounter (Signed)
 Spoke with pt. Results were reviewed on [employer] website and noted that nothing was detected. Pt made aware.  Pt stated that she would call back and make an appointment after she has discussed things with her husband.  Pt verbalized understanding with all questions answered.

## 2024-05-21 ENCOUNTER — Encounter: Payer: Self-pay | Admitting: Gastroenterology

## 2024-05-28 ENCOUNTER — Ambulatory Visit: Admitting: Psychology

## 2024-06-02 ENCOUNTER — Ambulatory Visit (INDEPENDENT_AMBULATORY_CARE_PROVIDER_SITE_OTHER): Admitting: Psychology

## 2024-06-02 DIAGNOSIS — F419 Anxiety disorder, unspecified: Secondary | ICD-10-CM

## 2024-06-02 DIAGNOSIS — F331 Major depressive disorder, recurrent, moderate: Secondary | ICD-10-CM

## 2024-06-02 NOTE — Progress Notes (Signed)
 [facility] Counselor/Therapist Progress Note  Patient ID: Jacqueline Fowler, MRN: [id number],    Date: 06/02/2024  Time Spent: 56 minutes  Time in:  9:04 Time out:  10:00  Treatment Type: Individual Therapy  Reported Symptoms: depression  Mental Status Exam: Appearance:  Casual     Behavior: Appropriate  Motor: Normal  Speech/Language:  Normal Rate  Affect: Blunt  Mood: sad  Thought process: normal  Thought content:   WNL  Sensory/Perceptual disturbances:   WNL  Orientation: oriented to person, place, time/date, and situation  Attention: Good  Concentration: Good  Memory: WNL  Fund of knowledge:  Good  Insight:   Good  Judgment:  Good  Impulse Control: Good   Risk Assessment: Danger to Self:  No Self-injurious Behavior: No Danger to Others: No Duty to Warn:no Physical Aggression / Violence:No  Access to Firearms a concern: No  Gang Involvement:No   Subjective: The patient attended a face-to-face individual therapy session video visit.  The patient gave verbal consent for the session to be on caregility and she is aware of the limitations of telehealth.  The patient was in her home alone and the therapist was in the office.  The patient has had some significant health issues since I last saw her.  She was supposed to be seen last week and she had a migraine that was extremely bad that sent her to the emergency department.  The patient did have something she wanted to talk to me about today and we talked about the relationship with her mother.  Apparently her mother has misperceived something again and has severed ties with her.  The patient does seem to be doing better than she was previously because she is not taking on the responsibility for her mother not responding to her the way that she would like her to respond.  We talked about her mother not being capable of giving her any more than she is giving her and that sometimes her mother may be creating a  circumstance where there is a push pull.  I told the patient that I was very pleased at the progress that she has made since she has been in therapy.  We scheduled another session moving forward. Interventions: Cognitive Behavioral Therapy  Diagnosis:Anxiety disorder, unspecified type  Major depressive disorder, recurrent episode, moderate (HCC)   Plan: Client Abilities/Strengths  Intelligent, motivated, insightful  Client Treatment Preferences  Outpatient Individual therapy  Client Statement of Needs  I need some help with my depression  Treatment Level  Outpatient Individual therapy  Symptoms  Depressed or irritable mood.:  (Status: Improved). Feelings of hopelessness,  worthlessness, or inappropriate guilt.: (Status: maintained). History of chronic  or recurrent depression for which the client has taken antidepressant medication, been hospitalized, had outpatient treatment, or had a course of electroconvulsive therapy.: (Status:  maintained). Lack of energy.:  (Status: maintained). Low self-esteem.:  (Status: maintained). Poor concentration and indecisiveness.: (Status: maintained). Sleeplessness or hypersomnia.: (Status:  maintained). Social withdrawal.: (Status: maintained).  Problems Addressed  Unipolar Depression, Unipolar Depression    Goals 1. Develop healthy interpersonal relationships that lead to the alleviation  and help prevent the relapse of depression. 2. Develop healthy thinking patterns and beliefs about self, others, and the world that lead to the alleviation and help prevent the relapse of  depression. Objective Verbalize an understanding of healthy and unhealthy emotions with the intent of increasing the use of  healthy emotions to guide actions. Target Date: 2025-05-01 Frequency: biWeekly Progress:  70 Modality: individual Objective Learn and implement behavioral strategies to overcome depression. Target Date: 2025-05-01 Frequency: biWeekly Progress: 70  Modality: individual Related Interventions 1. Assist the client in developing skills that increase the likelihood of deriving pleasure from  behavioral activation (e.g., assertiveness skills, developing an exercise plan, less internal/more  external focus, increased social involvement); reinforce success. Objective Describe current and past experiences with depression including their impact on functioning and  attempts to resolve it. Target Date: 2025-05-01 Frequency: biWeekly Progress: 60 Modality: individual Related Interventions 1. Encourage the client to share his/her thoughts and feelings of depression; express empathy and  build rapport while identifying primary cognitive, behavioral, interpersonal, or other  contributors to depression.  Objective Learn and implement problem-solving and decision-making skills. Target Date: 2025-05-01 Frequency: biWeekly Progress: 60 Modality: individual Related Interventions 1. Encourage in the client the development of a positive problem orientation in which problems  and solving them are viewed as a natural part of life and not something to be feared, despaired,  or avoided. Objective Identify and replace thoughts and beliefs that support depression. Target Date: 2025-05-01 Frequency: biWeekly Progress: 30 Modality: individual Related Interventions 1. Conduct Cognitive-Behavioral Therapy (see Cognitive Behavior Therapy by Almarie; Overcoming Depression by Marine dunker al.), beginning with helping the client learn the connection among  cognition, depressive feelings, and actions. 2. Facilitate and reinforce the client's shift from biased depressive self-talk and beliefs to realitybased cognitive messages that enhance self-confidence and increase adaptive actions (see  Positive Self-Talk in the Adult Psychotherapy Homework Planner by Jenniffer). Diagnosis Axis  none 296.32 (Major depressive affective disorder, recurrent episode, moderate) - Open  -  [Signifier: n/a]  Conditions For Discharge Achievement of treatment goals and objectives Will continue to see the patient at least biweekly and work with her using CBT, Insight oriented approach and EMDR.  Patient approved the treatment plan.  Tane Biegler G Raynetta Osterloh, LCSW

## 2024-06-22 ENCOUNTER — Ambulatory Visit (INDEPENDENT_AMBULATORY_CARE_PROVIDER_SITE_OTHER): Admitting: Psychology

## 2024-06-22 DIAGNOSIS — F50811 Binge eating disorder, moderate: Secondary | ICD-10-CM

## 2024-06-22 DIAGNOSIS — F419 Anxiety disorder, unspecified: Secondary | ICD-10-CM

## 2024-06-22 DIAGNOSIS — F330 Major depressive disorder, recurrent, mild: Secondary | ICD-10-CM

## 2024-06-22 NOTE — Progress Notes (Signed)
 [facility] Counselor/Therapist Progress Note  Patient ID: Jacqueline Fowler, MRN: [id number],    Date: 06/22/2024  Time Spent: 56 minutes  Time in:  10:04 Time out:  11:00  Treatment Type: Individual Therapy  Reported Symptoms: depression  Mental Status Exam: Appearance:  Casual     Behavior: Appropriate  Motor: Normal  Speech/Language:  Normal Rate  Affect: Blunt  Mood: pleasant  Thought process: normal  Thought content:   WNL  Sensory/Perceptual disturbances:   WNL  Orientation: oriented to person, place, time/date, and situation  Attention: Good  Concentration: Good  Memory: WNL  Fund of knowledge:  Good  Insight:   Good  Judgment:  Good  Impulse Control: Good   Risk Assessment: Danger to Self:  No Self-injurious Behavior: No Danger to Others: No Duty to Warn:no Physical Aggression / Violence:No  Access to Firearms a concern: No  Gang Involvement:No   Subjective: The patient attended a face-to-face individual therapy session today.  The patient presents as pleasant and cooperative.  The patient reports that she is still dealing with the situation with her mother and she is not exactly sure how she wants to move forward with that we did process that a bit more this time and I encouraged her to do what she felt like she needed to do and that situation.  The patient also reports that she is going to a holistic doctor up in [place]  and she is paying for it out of pocket.  She seems to be doing much better than she previously has done and she is not nearly as reactive as she used to be.  She seems to have a better handle on how to manage her emotions and she does not seem to be getting as depressed or suicidal.  Interventions: Cognitive Behavioral Therapy  Diagnosis:Anxiety disorder, unspecified type  Mild episode of recurrent major depressive disorder (HCC)  Moderate binge-eating disorder   Plan: Client Abilities/Strengths  Intelligent, motivated,  insightful  Client Treatment Preferences  Outpatient Individual therapy  Client Statement of Needs  I need some help with my depression  Treatment Level  Outpatient Individual therapy  Symptoms  Depressed or irritable mood.:  (Status: Improved). Feelings of hopelessness,  worthlessness, or inappropriate guilt.: (Status: maintained). History of chronic  or recurrent depression for which the client has taken antidepressant medication, been hospitalized, had outpatient treatment, or had a course of electroconvulsive therapy.: (Status:  maintained). Lack of energy.:  (Status: maintained). Low self-esteem.:  (Status: maintained). Poor concentration and indecisiveness.: (Status: maintained). Sleeplessness or hypersomnia.: (Status:  maintained). Social withdrawal.: (Status: maintained).  Problems Addressed  Unipolar Depression, Unipolar Depression    Goals 1. Develop healthy interpersonal relationships that lead to the alleviation  and help prevent the relapse of depression. 2. Develop healthy thinking patterns and beliefs about self, others, and the world that lead to the alleviation and help prevent the relapse of  depression. Objective Verbalize an understanding of healthy and unhealthy emotions with the intent of increasing the use of  healthy emotions to guide actions. Target Date: 2025-05-01 Frequency: biWeekly Progress: 70 Modality: individual Objective Learn and implement behavioral strategies to overcome depression. Target Date: 2025-05-01 Frequency: biWeekly Progress: 70 Modality: individual Related Interventions 1. Assist the client in developing skills that increase the likelihood of deriving pleasure from  behavioral activation (e.g., assertiveness skills, developing an exercise plan, less internal/more  external focus, increased social involvement); reinforce success. Objective Describe current and past experiences with depression including their impact on functioning  and  attempts to resolve it. Target Date: 2025-05-01 Frequency: biWeekly Progress: 60 Modality: individual Related Interventions 1. Encourage the client to share his/her thoughts and feelings of depression; express empathy and  build rapport while identifying primary cognitive, behavioral, interpersonal, or other  contributors to depression.  Objective Learn and implement problem-solving and decision-making skills. Target Date: 2025-05-01 Frequency: biWeekly Progress: 60 Modality: individual Related Interventions 1. Encourage in the client the development of a positive problem orientation in which problems  and solving them are viewed as a natural part of life and not something to be feared, despaired,  or avoided. Objective Identify and replace thoughts and beliefs that support depression. Target Date: 2025-05-01 Frequency: biWeekly Progress: 30 Modality: individual Related Interventions 1. Conduct Cognitive-Behavioral Therapy (see Cognitive Behavior Therapy by Almarie; Overcoming Depression by Marine dunker al.), beginning with helping the client learn the connection among  cognition, depressive feelings, and actions. 2. Facilitate and reinforce the client's shift from biased depressive self-talk and beliefs to realitybased cognitive messages that enhance self-confidence and increase adaptive actions (see  Positive Self-Talk in the Adult Psychotherapy Homework Planner by Jenniffer). Diagnosis Axis  none 296.32 (Major depressive affective disorder, recurrent episode, moderate) - Open -  [Signifier: n/a]  Conditions For Discharge Achievement of treatment goals and objectives Will continue to see the patient at least biweekly and work with her using CBT, Insight oriented approach and EMDR.  Patient approved the treatment plan.  Dyneisha Murchison G Esai Stecklein, LCSW

## 2024-07-21 ENCOUNTER — Ambulatory Visit (INDEPENDENT_AMBULATORY_CARE_PROVIDER_SITE_OTHER): Admitting: Psychology

## 2024-07-21 DIAGNOSIS — F419 Anxiety disorder, unspecified: Secondary | ICD-10-CM

## 2024-07-21 DIAGNOSIS — F330 Major depressive disorder, recurrent, mild: Secondary | ICD-10-CM | POA: Diagnosis not present

## 2024-07-21 DIAGNOSIS — F50811 Binge eating disorder, moderate: Secondary | ICD-10-CM | POA: Diagnosis not present

## 2024-07-21 NOTE — Progress Notes (Signed)
 [facility] Counselor/Therapist Progress Note  Patient ID: Jacqueline Fowler, MRN: [id number],    Date: 07/21/2024  Time Spent: 59 minutes  Time in:  11:06 Time out: 12:05   Treatment Type: Individual Therapy  Reported Symptoms: depression  Mental Status Exam: Appearance:  Casual     Behavior: Appropriate  Motor: Normal  Speech/Language:  Normal Rate  Affect: Blunt  Mood: sad  Thought process: normal  Thought content:   WNL  Sensory/Perceptual disturbances:   WNL  Orientation: oriented to person, place, time/date, and situation  Attention: Good  Concentration: Good  Memory: WNL  Fund of knowledge:  Good  Insight:   Good  Judgment:  Good  Impulse Control: Good   Risk Assessment: Danger to Self:  No Self-injurious Behavior: No Danger to Others: No Duty to Warn:no Physical Aggression / Violence:No  Access to Firearms a concern: No  Gang Involvement:No   Subjective: The patient attended a face-to-face individual therapy session via video visit.  The patient gave verbal consent for the session to be on caregility and the patient is aware of the limitations of telehealth.  The patient was in her home alone and the therapist was in the office.  The patient states that she physically has not been feeling well and apparently she has gotten back into negative thinking on a regular basis.  We reviewed what she already knows about how her thoughts affect her mood and talked about how to keep herself from getting into that negative headspace.  We talked about how she done a better job of staying out of the rabbit hole.  We also talked about how her mood was so much better when she did this.  I encouraged her between now and her next session to continue to work on her thought processes to keep them more positive and I gave her positive feedback for at least being able to recognize what she is doing and changing her thought patterns. Interventions: Cognitive Behavioral  Therapy  Diagnosis:Anxiety disorder, unspecified type  Mild episode of recurrent major depressive disorder (HCC)  Moderate binge-eating disorder   Plan: Client Abilities/Strengths  Intelligent, motivated, insightful  Client Treatment Preferences  Outpatient Individual therapy  Client Statement of Needs  I need some help with my depression  Treatment Level  Outpatient Individual therapy  Symptoms  Depressed or irritable mood.:  (Status: Improved). Feelings of hopelessness,  worthlessness, or inappropriate guilt.: (Status: maintained). History of chronic  or recurrent depression for which the client has taken antidepressant medication, been hospitalized, had outpatient treatment, or had a course of electroconvulsive therapy.: (Status:  maintained). Lack of energy.:  (Status: maintained). Low self-esteem.:  (Status: maintained). Poor concentration and indecisiveness.: (Status: maintained). Sleeplessness or hypersomnia.: (Status:  maintained). Social withdrawal.: (Status: maintained).  Problems Addressed  Unipolar Depression, Unipolar Depression    Goals 1. Develop healthy interpersonal relationships that lead to the alleviation  and help prevent the relapse of depression. 2. Develop healthy thinking patterns and beliefs about self, others, and the world that lead to the alleviation and help prevent the relapse of  depression. Objective Verbalize an understanding of healthy and unhealthy emotions with the intent of increasing the use of  healthy emotions to guide actions. Target Date: 2025-05-01 Frequency: biWeekly Progress: 70 Modality: individual Objective Learn and implement behavioral strategies to overcome depression. Target Date: 2025-05-01 Frequency: biWeekly Progress: 70 Modality: individual Related Interventions 1. Assist the client in developing skills that increase the likelihood of deriving pleasure from  behavioral activation (  e.g., assertiveness skills,  developing an exercise plan, less internal/more  external focus, increased social involvement); reinforce success. Objective Describe current and past experiences with depression including their impact on functioning and  attempts to resolve it. Target Date: 2025-05-01 Frequency: biWeekly Progress: 60 Modality: individual Related Interventions 1. Encourage the client to share his/her thoughts and feelings of depression; express empathy and  build rapport while identifying primary cognitive, behavioral, interpersonal, or other  contributors to depression.  Objective Learn and implement problem-solving and decision-making skills. Target Date: 2025-05-01 Frequency: biWeekly Progress: 60 Modality: individual Related Interventions 1. Encourage in the client the development of a positive problem orientation in which problems  and solving them are viewed as a natural part of life and not something to be feared, despaired,  or avoided. Objective Identify and replace thoughts and beliefs that support depression. Target Date: 2025-05-01 Frequency: biWeekly Progress: 30 Modality: individual Related Interventions 1. Conduct Cognitive-Behavioral Therapy (see Cognitive Behavior Therapy by Almarie; Overcoming Depression by Marine dunker al.), beginning with helping the client learn the connection among  cognition, depressive feelings, and actions. 2. Facilitate and reinforce the client's shift from biased depressive self-talk and beliefs to realitybased cognitive messages that enhance self-confidence and increase adaptive actions (see  Positive Self-Talk in the Adult Psychotherapy Homework Planner by Jenniffer). Diagnosis Axis  none 296.32 (Major depressive affective disorder, recurrent episode, moderate) - Open -  [Signifier: n/a]  Conditions For Discharge Achievement of treatment goals and objectives Will continue to see the patient at least biweekly and work with her using CBT, Insight oriented  approach and EMDR.  Patient approved the treatment plan.  Sahra Converse G Vergene Marland, LCSW

## 2024-07-23 ENCOUNTER — Ambulatory Visit: Admitting: Psychology

## 2024-08-20 ENCOUNTER — Ambulatory Visit: Admitting: Psychology

## 2024-08-20 DIAGNOSIS — F419 Anxiety disorder, unspecified: Secondary | ICD-10-CM | POA: Diagnosis not present

## 2024-08-20 DIAGNOSIS — F330 Major depressive disorder, recurrent, mild: Secondary | ICD-10-CM

## 2024-08-20 DIAGNOSIS — F50811 Binge eating disorder, moderate: Secondary | ICD-10-CM

## 2024-08-20 NOTE — Progress Notes (Signed)
 [facility] Counselor/Therapist Progress Note  Patient ID: NOELI LAVERY, MRN: [id number],    Date: 08/20/2024  Time Spent: 60 minutes  Time in:  10:02Time out: 11:02   Treatment Type: Individual Therapy  Reported Symptoms: depression  Mental Status Exam: Appearance:  Casual     Behavior: Appropriate  Motor: Normal  Speech/Language:  Normal Rate  Affect: Blunt  Mood: sad  Thought process: normal  Thought content:   WNL  Sensory/Perceptual disturbances:   WNL  Orientation: oriented to person, place, time/date, and situation  Attention: Good  Concentration: Good  Memory: WNL  Fund of knowledge:  Good  Insight:   Good  Judgment:  Good  Impulse Control: Good   Risk Assessment: Danger to Self:  No Self-injurious Behavior: No Danger to Others: No Duty to Warn:no Physical Aggression / Violence:No  Access to Firearms a concern: No  Gang Involvement:No   Subjective: The patient attended a face-to-face individual therapy session via video visit.  The patient gave verbal consent for the session to be on caregility and the patient is aware of the limitations of telehealth.  The patient was in her home alone and the therapist was in the office.    The patient presents as sad.  The patient reports that she is still having physical ailments which are causing her difficulties.  I do think this affects her coping abilities.  We talked about what she sent to her mother and again she has tried to convince her mother that her mother should except responsibility for her beliefs and thoughts.  I explained to the patient again that her mother was not capable and that she did blaming and projection and I also educated her on attachment styles to parents and explained to her why that affected her so much growing up.  I did encourage her to continue to think about what it was that is causing her to feel the need to try to convince her mother of something else.  We talked about the need  to do EMDR around what ever it was that is causing her to continue to have unrealistic expectations of people that she knows are incapable of giving her what she needs.  She identified the negative cognition of I am unlovable.  I encouraged her to come into the office the next time she has therapy so that we can do EMDR around that negative cognition. Interventions: Cognitive Behavioral Therapy  Diagnosis:Anxiety disorder, unspecified type  Mild episode of recurrent major depressive disorder  Moderate binge-eating disorder   Plan: Client Abilities/Strengths  Intelligent, motivated, insightful  Client Treatment Preferences  Outpatient Individual therapy  Client Statement of Needs  I need some help with my depression  Treatment Level  Outpatient Individual therapy  Symptoms  Depressed or irritable mood.:  (Status: Improved). Feelings of hopelessness,  worthlessness, or inappropriate guilt.: (Status: maintained). History of chronic  or recurrent depression for which the client has taken antidepressant medication, been hospitalized, had outpatient treatment, or had a course of electroconvulsive therapy.: (Status:  maintained). Lack of energy.:  (Status: maintained). Low self-esteem.:  (Status: maintained). Poor concentration and indecisiveness.: (Status: maintained). Sleeplessness or hypersomnia.: (Status:  maintained). Social withdrawal.: (Status: maintained).  Problems Addressed  Unipolar Depression, Unipolar Depression    Goals 1. Develop healthy interpersonal relationships that lead to the alleviation  and help prevent the relapse of depression. 2. Develop healthy thinking patterns and beliefs about self, others, and the world that lead to the alleviation and help prevent  the relapse of  depression. Objective Verbalize an understanding of healthy and unhealthy emotions with the intent of increasing the use of  healthy emotions to guide actions. Target Date: 2025-05-01  Frequency: biWeekly Progress: 70 Modality: individual Objective Learn and implement behavioral strategies to overcome depression. Target Date: 2025-05-01 Frequency: biWeekly Progress: 70 Modality: individual Related Interventions 1. Assist the client in developing skills that increase the likelihood of deriving pleasure from  behavioral activation (e.g., assertiveness skills, developing an exercise plan, less internal/more  external focus, increased social involvement); reinforce success. Objective Describe current and past experiences with depression including their impact on functioning and  attempts to resolve it. Target Date: 2025-05-01 Frequency: biWeekly Progress: 60 Modality: individual Related Interventions 1. Encourage the client to share his/her thoughts and feelings of depression; express empathy and  build rapport while identifying primary cognitive, behavioral, interpersonal, or other  contributors to depression.  Objective Learn and implement problem-solving and decision-making skills. Target Date: 2025-05-01 Frequency: biWeekly Progress: 60 Modality: individual Related Interventions 1. Encourage in the client the development of a positive problem orientation in which problems  and solving them are viewed as a natural part of life and not something to be feared, despaired,  or avoided. Objective Identify and replace thoughts and beliefs that support depression. Target Date: 2025-05-01 Frequency: biWeekly Progress: 30 Modality: individual Related Interventions 1. Conduct Cognitive-Behavioral Therapy (see Cognitive Behavior Therapy by Almarie; Overcoming Depression by Marine dunker al.), beginning with helping the client learn the connection among  cognition, depressive feelings, and actions. 2. Facilitate and reinforce the client's shift from biased depressive self-talk and beliefs to realitybased cognitive messages that enhance self-confidence and increase adaptive actions  (see  Positive Self-Talk in the Adult Psychotherapy Homework Planner by Jenniffer). Diagnosis Axis  none 296.32 (Major depressive affective disorder, recurrent episode, moderate) - Open -  [Signifier: n/a]  Conditions For Discharge Achievement of treatment goals and objectives Will continue to see the patient at least biweekly and work with her using CBT, Insight oriented approach and EMDR.  Patient approved the treatment plan.  Nakayla Rorabaugh G Seve Monette, LCSW

## 2024-09-13 ENCOUNTER — Other Ambulatory Visit: Payer: Self-pay

## 2024-09-13 ENCOUNTER — Telehealth: Payer: Self-pay | Admitting: Gastroenterology

## 2024-09-13 DIAGNOSIS — K760 Fatty (change of) liver, not elsewhere classified: Secondary | ICD-10-CM

## 2024-09-13 DIAGNOSIS — K769 Liver disease, unspecified: Secondary | ICD-10-CM

## 2024-09-13 DIAGNOSIS — K7460 Unspecified cirrhosis of liver: Secondary | ICD-10-CM

## 2024-09-13 NOTE — Telephone Encounter (Signed)
 Spoke with pt.  Please refer to other phone note.

## 2024-09-13 NOTE — Telephone Encounter (Signed)
 Inbound call from patient stating that she received a call from the nurse in regards to her MRI. Patient is requesting a call back. Please advise.

## 2024-09-13 NOTE — Telephone Encounter (Signed)
 Spoke to pt about reminder and Dr. Charlanne recommendations.  Pt stated that her last MRI was Oct 2024.  Pt stated that she has anxiety about getting the MRI due to the claustrophobia. Pt stated that she does not feel that sedative will help.   Pt was made aware of the option of an open MRI that at is  available at certain plaices.  ( Maybe [facility]) pt stated that she would call there and call her insurance and then call us  back.

## 2024-09-13 NOTE — Telephone Encounter (Signed)
 Inbound call from patient stating that she was returning a call back to the nurse. Patient is requesting a call back. Please advise.

## 2024-09-13 NOTE — Telephone Encounter (Signed)
 See alternate TE. No documentation of an outbound call to patient.

## 2024-09-13 NOTE — Telephone Encounter (Signed)
 Reminder was received in Epic.  Pt made aware.  Pt requested OPEN MRI due to her anxiety and feeling catastrophic.  Pt requested it to be done at [facility] ( Phone number) [phone] Order was Faxed to [phone].  Pt was notified that I would follow up in one week to check on the process of the MRI being ordered.   Please assist with PA

## 2024-09-16 ENCOUNTER — Telehealth: Payer: Self-pay

## 2024-09-16 NOTE — Telephone Encounter (Signed)
-----   Message from Jacqueline Fowler sent at 09/16/2024  7:03 AM EST ----- Regarding: RE: Abdomen - MRI GM, At this point, you or the MD can call [phone] and ask to speak to the nurse reviewer for this case.  Order ID: [id number]      Member #: [id number] ----- Message ----- From: Mercer Cristino SAILOR, RN Sent: 09/15/2024   4:44 PM EST To: Jacqueline D McPeak Subject: RE: Abdomen - MRI                              Jacqueline, it says the test should be done 3-6 months after her last exam. Last exam was 08/2023 at [facility]. Is there anyway to have someone review again? ----- Message ----- FromBETHA Micki Izetta JONETTA Sent: 09/15/2024   1:09 PM EST To: Elspeth Munroe, RN Subject: Abdomen - MRI                                  Good afternoon, We received a letter from [facility] stating the MRI was Non-Authorized. Listed below was given the reasons why:  Clinical Rationale:  Your doctor told us  that you had abnormalities on another test of your liver. Your doctor ordered an MRI of your abdomen. An MRI is a way to take pictures of the inside of your body. This test should be done 3 to 6 months after the last test unless this spot has changed or increased in size. We reviewed the notes we have. The notes do not show that your spot has changed. Based on the information we have, this test is not medically necessary. We used [employer] Benefits Management Clinical Guideline titled Imaging of the Abdomen and Pelvis to make this decision. You may view this guideline at [email or web address].  Order ID: [id number]       Member #: [id number] Peer-to-Peer [phone]  Thank you.

## 2024-09-16 NOTE — Telephone Encounter (Signed)
 Fowler, Jacqueline D  Jacqueline Fowler, [place], Jacqueline Fowler Called [place]-- Spoke with Elna from the mattel-- he has opened the case back up. I am faxing over all the records to the appeals dept hoping they will authorize the MRI.

## 2024-09-16 NOTE — Telephone Encounter (Signed)
 Called listed phone number and spoke with Angelique. I was informed that even if a peer-to-peer is completed the decision can not be changed. An appeal needs to be submitted through [facility].

## 2024-09-17 ENCOUNTER — Ambulatory Visit: Admitting: Psychology

## 2024-09-17 DIAGNOSIS — F330 Major depressive disorder, recurrent, mild: Secondary | ICD-10-CM

## 2024-09-17 DIAGNOSIS — F50811 Binge eating disorder, moderate: Secondary | ICD-10-CM | POA: Diagnosis not present

## 2024-09-17 DIAGNOSIS — F419 Anxiety disorder, unspecified: Secondary | ICD-10-CM

## 2024-09-17 NOTE — Progress Notes (Signed)
 [facility] Counselor/Therapist Progress Note  Patient ID: Jacqueline Fowler, MRN: [id number],    Date: 09/17/2024  Time Spent: 60 minutes  Time in:  10:00 Time out: 11:00   Treatment Type: Individual Therapy  Reported Symptoms: depression  Mental Status Exam: Appearance:  Casual     Behavior: Appropriate  Motor: Normal  Speech/Language:  Normal Rate  Affect: Blunt  Mood: sad  Thought process: normal  Thought content:   WNL  Sensory/Perceptual disturbances:   WNL  Orientation: oriented to person, place, time/date, and situation  Attention: Good  Concentration: Good  Memory: WNL  Fund of knowledge:  Good  Insight:   Good  Judgment:  Good  Impulse Control: Good   Risk Assessment: Danger to Self:  No Self-injurious Behavior: No Danger to Others: No Duty to Warn:no Physical Aggression / Violence:No  Access to Firearms a concern: No  Gang Involvement:No   Subjective: The patient attended a face-to-face individual therapy session in the office today.  The patient presents as pleasant and cooperative.  The patient states that she came to the office because she wanted to do EMDR today.  She is thinking in a negative way and I do believe that we need to desensitize her to something but we needed to talk today about where the belief came in and figure out what we were going to target with the EMDR.  In addition she has an MRI within the next few weeks and I did some education on deep breathing and mindfulness and meditation with her.  She has canceled several of her appointments and gone to every other month and it is going to be difficult to try to make sure that we get the EMDR done before I retire.  She did make an appointment for next week and I told her that I wanted her to come into the office for that when and we will do EMDR during that session.  I encouraged her to do meditation and mindfulness on a daily basis.  I also taught her how to do progressive relaxation so  that she can decrease her stress level before the MRI.  Interventions: Cognitive Behavioral Therapy  Diagnosis:Anxiety disorder, unspecified type  Mild episode of recurrent major depressive disorder  Moderate binge-eating disorder   Plan: Client Abilities/Strengths  Intelligent, motivated, insightful  Client Treatment Preferences  Outpatient Individual therapy  Client Statement of Needs  I need some help with my depression  Treatment Level  Outpatient Individual therapy  Symptoms  Depressed or irritable mood.:  (Status: Improved). Feelings of hopelessness,  worthlessness, or inappropriate guilt.: (Status: maintained). History of chronic  or recurrent depression for which the client has taken antidepressant medication, been hospitalized, had outpatient treatment, or had a course of electroconvulsive therapy.: (Status:  maintained). Lack of energy.:  (Status: maintained). Low self-esteem.:  (Status: maintained). Poor concentration and indecisiveness.: (Status: maintained). Sleeplessness or hypersomnia.: (Status:  maintained). Social withdrawal.: (Status: maintained).  Problems Addressed  Unipolar Depression, Unipolar Depression    Goals 1. Develop healthy interpersonal relationships that lead to the alleviation  and help prevent the relapse of depression. 2. Develop healthy thinking patterns and beliefs about self, others, and the world that lead to the alleviation and help prevent the relapse of  depression. Objective Verbalize an understanding of healthy and unhealthy emotions with the intent of increasing the use of  healthy emotions to guide actions. Target Date: 2025-05-01 Frequency: biWeekly Progress: 70 Modality: individual Objective Learn and implement behavioral strategies to overcome  depression. Target Date: 2025-05-01 Frequency: biWeekly Progress: 70 Modality: individual Related Interventions 1. Assist the client in developing skills that increase the  likelihood of deriving pleasure from  behavioral activation (e.g., assertiveness skills, developing an exercise plan, less internal/more  external focus, increased social involvement); reinforce success. Objective Describe current and past experiences with depression including their impact on functioning and  attempts to resolve it. Target Date: 2025-05-01 Frequency: biWeekly Progress: 60 Modality: individual Related Interventions 1. Encourage the client to share his/her thoughts and feelings of depression; express empathy and  build rapport while identifying primary cognitive, behavioral, interpersonal, or other  contributors to depression.  Objective Learn and implement problem-solving and decision-making skills. Target Date: 2025-05-01 Frequency: biWeekly Progress: 60 Modality: individual Related Interventions 1. Encourage in the client the development of a positive problem orientation in which problems  and solving them are viewed as a natural part of life and not something to be feared, despaired,  or avoided. Objective Identify and replace thoughts and beliefs that support depression. Target Date: 2025-05-01 Frequency: biWeekly Progress: 30 Modality: individual Related Interventions 1. Conduct Cognitive-Behavioral Therapy (see Cognitive Behavior Therapy by Almarie; Overcoming Depression by Marine dunker al.), beginning with helping the client learn the connection among  cognition, depressive feelings, and actions. 2. Facilitate and reinforce the client's shift from biased depressive self-talk and beliefs to realitybased cognitive messages that enhance self-confidence and increase adaptive actions (see  Positive Self-Talk in the Adult Psychotherapy Homework Planner by Jenniffer). Diagnosis 296.32 (Major depressive affective disorder, recurrent episode, moderate) - Open -  [Signifier: n/a]  Conditions For Discharge Achievement of treatment goals and objectives Will continue to see  the patient at least biweekly and work with her using CBT, Insight oriented approach and EMDR.  Patient approved the treatment plan.  Braxden Lovering G Skilynn Durney, LCSW

## 2024-09-20 NOTE — Telephone Encounter (Signed)
 Pt called back and stated that she would like to schedule an office visit after her MRI that is scheduled on the 09/28/2024. Pt was scheduled to see Alan Coombs PA on 10/04/2024 at 8:40 PM.  Pt made aware.  Pt verbalized understanding with all questions answered.

## 2024-09-20 NOTE — Telephone Encounter (Signed)
 Reminder received in Epic.  Pt stated that she was scheduled for 09/28/2024 for the MRI.  Pt stated that [facility] notified her that it has been approved by her insurance.  Pt was notified that I would reach out to   [facility] and to her on 09/29/2024 to get report faxed over for Dr. Charlanne to review.  Pt verbalized understanding with all questions answered.

## 2024-09-20 NOTE — Telephone Encounter (Signed)
 Patient stated she was speaking with Jacqueline Fowler but call was disconnected. Please advise, thank you

## 2024-09-20 NOTE — Telephone Encounter (Signed)
 Left message for pt to call back

## 2024-09-21 ENCOUNTER — Ambulatory Visit: Admitting: Psychology

## 2024-09-28 ENCOUNTER — Telehealth: Payer: Self-pay | Admitting: Gastroenterology

## 2024-09-28 NOTE — Telephone Encounter (Signed)
 Pt stated that she had her MRI done and the results are availiable. Unable to locate results in Epic.  [facility] [phone] contatced and left a detailed message requesting results be faxed to [phone] Beecher Standing RN

## 2024-09-28 NOTE — Telephone Encounter (Signed)
 Inbound call from patient requesting to speak to [facility]. Patient is requesting a call back. Please advise.

## 2024-09-29 NOTE — Telephone Encounter (Signed)
 Fax received with pt MRI report.  Copy made and original sent to be scanned into Epic.

## 2024-10-01 NOTE — Progress Notes (Unsigned)
 10/04/2024 Jacqueline Fowler [id number] 1971-01-15  Referring provider: Pia Kerney SQUIBB, MD Primary GI doctor: Dr. Charlanne  ASSESSMENT AND PLAN:  Cirrhosis secondary to [facility] 08/04/2023 WBC 5.7 HGB 13.6 Platelets 194.0 08/04/2023 AST 29 ALT 23 Alkphos 68 TBili 0.4 08/04/2023 INR 1.1 MELD 3.0: 7 at 10/04/2024 10:06 AM MELD-Na: 6 at 10/04/2024 10:06 AM Calculated from: Serum Creatinine: 0.68 mg/dL (Using min of 1 mg/dL) at 88/75/7974 89:93 AM Serum Sodium: 140 mEq/L (Using max of 137 mEq/L) at 10/04/2024 10:06 AM Total Bilirubin: 0.4 mg/dL (Using min of 1 mg/dL) at 88/75/7974 89:93 AM Serum Albumin: 4.4 g/dL (Using max of 3.5 g/dL) at 88/75/7974 89:93 AM INR(ratio): 1 ratio at 10/04/2024 10:06 AM Age at listing (hypothetical): 53 years Sex: Female at 10/04/2024 10:06 AM  Ascites:      No history -Nutrition and low sodium diet discussed with patient and information given - appears euvolemic Varices screening / surveillance EGD:     Last EGD 2022 No history of varices. On prophylaxis propanolol 10 mg BID, may need to increase dose Hepatic encephalopathy:  Pt does not report any symptoms consistent with HE and no asterixis on exam.  Most recent HCC screening:    09/28/2024 MRI abdomen with and without contrast cirrhosis sequelae of portal hypertension no evidence of HCC status post cholecystectomy splenomegaly small hiatal hernia no ascites small caliber upper abdominal varices Last AFP 08/04/2023 3.6  Repeat AFP Recall HCC 6 months Provided general information to the patient: -Continue daily multivitamin -Recommended 30 minutes of aerobic and resistance exercise 3 days/week -Encouraged pt to increase protein intake  GERD with history of esophageal stenosis S/p cholecystectomy  06/13/2021 EGD status post dilatation negative EOE small HH gastritis negative HP normal duodenum negative celiac 2024 UGI severe repeat episodes of gastrointestinal reflux small mixed type hiatal hernia  mildly distended proximal small bowel loops likely insignificant given brisk travel of contrast Has been on Ozempic for several months having worsening reflux especially at night, nausea, severe GERD occasionally dark stools. Patient is on propranolol  for prophylaxis against varices however MRI shows new portal hypertension and with the symptoms we will plan on EGD to evaluate - Continue pantoprazole  40 mg twice daily, add Pepcid  40 mg at night - Continue with weight loss - Given information about gastroparesis diet with GLP-1 and information about GLP-1  History of tubular adenomatous polyps Next colonoscopy recommended in March 2027   IBS/abdominal pain- D Loose BM, can be up to 8 times a day Can have nocturnal symptoms the last 2 nights Denies fever, chills, ABX several months ago No new medications - Diatherix here in the office to rule out infection - Will get sed rate, CRP - Consider trial of Xifaxan - Consider trial of amitriptyline 10 mg at night - FODMAP diet given  Rectal pain with burning, severe pain, rectal bleeding Has obvious thrombosed external hemorrhoid and posterior fissure -Refer to surgery for external hemorrhoid - squatty potty, etc - hemorroid cream - given diltiazem/lidocaine  for fissure  Morbid obesity  Body mass index is 45.63 kg/m.  -Patient has been advised to make an attempt to improve diet and exercise patterns to aid in weight loss. -Recommended diet heavy in fruits and veggies and low in animal meats, cheeses, and dairy products, appropriate calorie intake   Patient Care Team: Pia Kerney SQUIBB, MD as PCP - General (Internal Medicine)  HISTORY OF PRESENT ILLNESS: 53 y.o. female presents for follow-up of cirrhosis likely secondary to [facility]  Cirrhosis  history  The following data was reviewed at the time of this encounter: Serologic workup: 04/2021 negative other than ANA +1: 160    Latest Ref Rng & Units 08/04/2023   12:35 04/21/2023   10:20  10/17/2022   14:55  Hep C Labs  Hemoglobin 12.0 - 15.0 g/dL 86.3  85.9  85.5   Platelets 150.0 - 400.0 K/uL 194.0  221.0  273.0   WBC 4.0 - 10.5 K/uL 5.7  5.4  7.5   AST 0 - 37 U/L 29  27  27    ALT 0 - 35 U/L 23  21  23    Albumin 3.5 - 5.2 g/dL 4.0  4.2  4.4   Creatinine 0.40 - 1.20 mg/dL 9.22  9.26  9.22   INR 0.8 - 1.0 ratio 1.1   1.0    Last EGD 06/2021 no varices on prophylaxis propranolol  10 mg twice daily Last HCC screen: 10/21/2022 MR abdomen with without contrast stable size and appearance of LR3 lesion Last AFP 08/04/2023 3.6  Wt Readings from Last 3 Encounters:  10/04/24 272 lb 2 oz (123.4 kg)  08/04/23 274 lb (124.3 kg)  10/17/22 278 lb 9.6 oz (126.4 kg)   History of Present Illness   Jacqueline Fowler is a 53 year old female with cirrhosis who presents with rectal pain and gastrointestinal symptoms.  She experiences severe rectal pain described as a razor-like sensation, causing significant distress. The pain is associated with bowel movements and sitting, accompanied by bright red bleeding during bowel movements. These symptoms have been present for the past two to three days. Frequent bowel movements, up to eight times a day, exacerbate the pain.  She has a history of irritable bowel syndrome and takes medication for it, but continues to experience daily issues. She reports loose bowel movements, sometimes up to eight times a day, and has had nocturnal symptoms, waking up twice last night to use the bathroom. No fever, chills, or weight loss. She recalls taking antibiotics for a sinus infection a few months ago.  She has gastroesophageal reflux disease and takes rabeprazole  and famotidine , but still requires Tums or Mylanta at night. Reflux occurs primarily at night, which has improved with rabeprazole , reducing episodes of coughing and acid regurgitation. No food or pills getting caught in her throat.  She reports that her recent MRI was compared to prior studies and did not show  any concerning findings. She takes propranolol  10 mg twice daily. Sometimes notices dark green/black stools. No abdominal or leg swelling.  She had a colonoscopy in 2020 which revealed tubular adenomatous polyps and internal hemorrhoids.     Social history:  She  reports that she has quit smoking. She has never used smokeless tobacco. She reports that she does not currently use alcohol after a past usage of about 1.0 standard drink of alcohol per week. She reports that she does not use drugs.  RELEVANT GI HISTORY, LABS, IMAGING:  CBC    Component Value Date/Time   WBC 5.7 08/04/2023 1235   RBC 4.76 08/04/2023 1235   HGB 13.6 08/04/2023 1235   HGB 14.0 08/14/2022 1100   HCT 40.8 08/04/2023 1235   HCT 42.7 08/14/2022 1100   PLT 194.0 08/04/2023 1235   PLT 217 08/14/2022 1100   MCV 85.8 08/04/2023 1235   MCV 85 08/14/2022 1100   MCH 27.8 08/14/2022 1100   MCHC 33.2 08/04/2023 1235   RDW 14.8 08/04/2023 1235   RDW 15.1 08/14/2022 1100   LYMPHSABS  1.4 08/04/2023 1235   LYMPHSABS 1.4 08/14/2022 1100   MONOABS 0.3 08/04/2023 1235   EOSABS 0.0 08/04/2023 1235   EOSABS 0.1 08/14/2022 1100   BASOSABS 0.0 08/04/2023 1235   BASOSABS 0.0 08/14/2022 1100   No results for input(s): HGB in the last 8760 hours.  CMP     Component Value Date/Time   NA 139 08/04/2023 1235   NA 138 08/14/2022 1100   K 4.0 08/04/2023 1235   CL 106 08/04/2023 1235   CO2 25 08/04/2023 1235   GLUCOSE 117 (H) 08/04/2023 1235   BUN 13 08/04/2023 1235   BUN 15 08/14/2022 1100   CREATININE 0.77 08/04/2023 1235   CALCIUM 9.3 08/04/2023 1235   PROT 6.6 08/04/2023 1235   PROT 7.1 08/14/2022 1100   ALBUMIN 4.0 08/04/2023 1235   ALBUMIN 4.6 08/14/2022 1100   AST 29 08/04/2023 1235   ALT 23 08/04/2023 1235   ALKPHOS 68 08/04/2023 1235   BILITOT 0.4 08/04/2023 1235   BILITOT 0.5 08/14/2022 1100      Latest Ref Rng & Units 08/04/2023   12:35 PM 04/21/2023   10:20 AM 10/17/2022    2:55 PM  Hepatic Function   Total Protein 6.0 - 8.3 g/dL 6.6  6.8  7.4   Albumin 3.5 - 5.2 g/dL 4.0  4.2  4.4   AST 0 - 37 U/L 29  27  27    ALT 0 - 35 U/L 23  21  23    Alk Phosphatase 39 - 117 U/L 68  74  81   Total Bilirubin 0.2 - 1.2 mg/dL 0.4  0.4  0.5       Latest Ref Rng & Units 10/17/2022    2:55 PM 04/21/2023   10:20 AM 08/04/2023   12:35 PM  Hepatitis C  AFP ng/mL 4.0  3.8  3.6     Current Medications:   Current Outpatient Medications (Endocrine & Metabolic):    metFORMIN  (GLUCOPHAGE -XR) 500 MG 24 hr tablet, SMARTSIG:2 Tablet(s) By Mouth Every Evening   Current Outpatient Medications (Cardiovascular):    propranolol  (INDERAL ) 10 MG tablet, TAKE 1 TO 2 TABLETS BY MOUTH TWICE A DAY AS NEEDED FOR ANXIETY (Patient taking differently: Take 10 mg by mouth 2 (two) times daily. TAKE 1 TO 2 TABLETS BY MOUTH TWICE A DAY AS NEEDED FOR ANXIETY)     Current Outpatient Medications (Analgesics):    acetaminophen  (TYLENOL ) 500 MG tablet, Take 500 mg by mouth as needed.   Oxycodone HCl 10 MG TABS, Take 10 mg by mouth daily as needed.   Ubrogepant (UBRELVY) 100 MG TABS, 1 tablet as needed, may take second dose at least 2 hours after first dose up to 2 tablets per day as needed Orally Once a day; Duration: 30 days     Current Outpatient Medications (Other):    Acetylcysteine (NAC PO), Take 1-2 tablets by mouth daily.   AMBULATORY NON FORMULARY MEDICATION, Medication Name: Diltiazem 2%/Lidocaine  2% Using your index finger apply a small amount of medication inside the anal opening and to the external anal area twice daily x 6 weeks.   Bismuth Subsalicylate (PEPTO BISMOL PO), Take 1 Dose by mouth as needed.   brexpiprazole  (REXULTI ) 2 MG TABS tablet, Take 1 tablet (2 mg total) by mouth daily.   busPIRone  (BUSPAR ) 30 MG tablet, Take 1 tablet (30 mg total) by mouth 2 (two) times daily.   Calcium & Magnesium Carbonates (MYLANTA PO), Take 1 Dose by mouth as needed.   Calcium  Carbonate Antacid (TUMS PO), Take 2-3 tablets  by mouth as needed.   clonazePAM  (KLONOPIN ) 1 MG tablet, Take 1/2-1 tablet twice daily as needed for panic   cyclobenzaprine (FLEXERIL) 5 MG tablet, Take 5 mg by mouth 3 (three) times daily.   dicyclomine  (BENTYL ) 20 MG tablet, TAKE 1 TABLET (20 MG TOTAL) BY MOUTH 4 (FOUR) TIMES DAILY - BEFORE MEALS AND AT BEDTIME.   famotidine  (PEPCID ) 40 MG tablet, TAKE 1 TABLET (40 MG TOTAL) BY MOUTH 2 (TWO) TIMES DAILY. EVERY MORNING AND AT BEDTIME   gabapentin (NEURONTIN) 300 MG capsule, Take 300 mg by mouth daily.   lamoTRIgine  (LAMICTAL ) 100 MG tablet, Take 1 tablet (100 mg total) by mouth daily.   loperamide (IMODIUM) 2 MG capsule, Take by mouth as needed for diarrhea or loose stools.   ondansetron  (ZOFRAN -ODT) 4 MG disintegrating tablet, Take 1 tablet (4 mg total) by mouth every 8 (eight) hours as needed for nausea or vomiting.   RABEprazole  (ACIPHEX ) 20 MG tablet, Take 20 mg by mouth daily.   SEMAGLUTIDE-WEIGHT MANAGEMENT [place], Inject into the skin.   sertraline  (ZOLOFT ) 100 MG tablet, Take 1 tablet (100 mg total) by mouth in the morning and at bedtime.  Current Facility-Administered Medications (Other):    0.9 %  sodium chloride  infusion  Medical History:  Past Medical History:  Diagnosis Date   Anxiety    B12 deficiency    Back pain    Chest pain    Cirrhosis of liver (HCC)    Depression    Fatty liver    Gallbladder problem    Gallstones 2004   GERD (gastroesophageal reflux disease)    History of stomach ulcers    IBS (irritable bowel syndrome)    Joint pain    Obesity    Pre-diabetes    Swallowing difficulty    UTI (urinary tract infection)    Vitamin D  deficiency    Allergies:  Allergies  Allergen Reactions   Ibuprofen Other (See Comments)    Has fatty liver and cant take     Surgical History:  She  has a past surgical history that includes Breast biopsy (Right); IUD removal; Cholecystectomy (2004); Colonoscopy (08/07/2009); Esophagogastroduodenoscopy (05/31/2008); Upper  gastrointestinal endoscopy; esophagus stretching; tummy tuck (03/20/2020); Repair rectocele; and Total vaginal hysterectomy. Family History:  Her family history includes Anxiety disorder in her father and mother; Depression in her father, mother, and son; Heart disease in her father.  REVIEW OF SYSTEMS  : All other systems reviewed and negative except where noted in the History of Present Illness.  PHYSICAL EXAM: BP 110/86 (BP Location: Left Wrist, Patient Position: Sitting)   Pulse 82   Ht 5' 4.75 (1.645 m) Comment: height measured without shoes  Wt 272 lb 2 oz (123.4 kg)   LMP 05/15/2021 (Approximate)   BMI 45.63 kg/m  Physical Exam   GENERAL APPEARANCE: Well nourished, in no apparent distress. HEENT: No cervical lymphadenopathy, unremarkable thyroid, sclerae anicteric, conjunctiva pink. RESPIRATORY: Respiratory effort normal, breath sounds equal bilaterally without rales, rhonchi, or wheezing. CARDIO: Regular rate and rhythm with no murmurs, rubs, or gallops, peripheral pulses intact. ABDOMEN: Soft, non-distended, active bowel sounds in all four quadrants, no tenderness to palpation, no rebound, no mass appreciated. RECTAL: External hemorrhoid and posterior anal fissure present. MUSCULOSKELETAL: Full range of motion, normal gait, without edema. SKIN: Dry, intact without rashes or lesions. No jaundice. NEURO: Alert, oriented, no focal deficits. PSYCH: Cooperative, normal mood and affect.  Alan JONELLE Coombs, PA-C 10:42 AM

## 2024-10-04 ENCOUNTER — Encounter: Payer: Self-pay | Admitting: Physician Assistant

## 2024-10-04 ENCOUNTER — Ambulatory Visit: Admitting: Physician Assistant

## 2024-10-04 ENCOUNTER — Ambulatory Visit: Payer: Self-pay | Admitting: Physician Assistant

## 2024-10-04 ENCOUNTER — Other Ambulatory Visit (INDEPENDENT_AMBULATORY_CARE_PROVIDER_SITE_OTHER)

## 2024-10-04 VITALS — BP 110/86 | HR 82 | Ht 64.75 in | Wt 272.1 lb

## 2024-10-04 DIAGNOSIS — K760 Fatty (change of) liver, not elsewhere classified: Secondary | ICD-10-CM | POA: Diagnosis not present

## 2024-10-04 DIAGNOSIS — R197 Diarrhea, unspecified: Secondary | ICD-10-CM | POA: Diagnosis not present

## 2024-10-04 DIAGNOSIS — Z6841 Body Mass Index (BMI) 40.0 and over, adult: Secondary | ICD-10-CM

## 2024-10-04 DIAGNOSIS — K7460 Unspecified cirrhosis of liver: Secondary | ICD-10-CM | POA: Diagnosis not present

## 2024-10-04 DIAGNOSIS — K449 Diaphragmatic hernia without obstruction or gangrene: Secondary | ICD-10-CM

## 2024-10-04 DIAGNOSIS — K219 Gastro-esophageal reflux disease without esophagitis: Secondary | ICD-10-CM

## 2024-10-04 DIAGNOSIS — R109 Unspecified abdominal pain: Secondary | ICD-10-CM

## 2024-10-04 DIAGNOSIS — E66813 Obesity, class 3: Secondary | ICD-10-CM

## 2024-10-04 LAB — CBC WITH DIFFERENTIAL/PLATELET
Basophils Absolute: 0.1 K/uL (ref 0.0–0.1)
Basophils Relative: 0.9 % (ref 0.0–3.0)
Eosinophils Absolute: 0 K/uL (ref 0.0–0.7)
Eosinophils Relative: 0 % (ref 0.0–5.0)
HCT: 41.7 % (ref 36.0–46.0)
Hemoglobin: 14.2 g/dL (ref 12.0–15.0)
Lymphocytes Relative: 32.7 % (ref 12.0–46.0)
Lymphs Abs: 2.2 K/uL (ref 0.7–4.0)
MCHC: 34.1 g/dL (ref 30.0–36.0)
MCV: 85.7 fl (ref 78.0–100.0)
Monocytes Absolute: 0.5 K/uL (ref 0.1–1.0)
Monocytes Relative: 6.8 % (ref 3.0–12.0)
Neutro Abs: 3.9 K/uL (ref 1.4–7.7)
Neutrophils Relative %: 59.6 % (ref 43.0–77.0)
Platelets: 264 K/uL (ref 150.0–400.0)
RBC: 4.87 Mil/uL (ref 3.87–5.11)
RDW: 13.7 % (ref 11.5–15.5)
WBC: 6.6 K/uL (ref 4.0–10.5)

## 2024-10-04 LAB — PROTIME-INR
INR: 1 ratio (ref 0.8–1.0)
Prothrombin Time: 11 s (ref 9.6–13.1)

## 2024-10-04 LAB — COMPREHENSIVE METABOLIC PANEL WITH GFR
ALT: 25 U/L (ref 0–35)
AST: 31 U/L (ref 0–37)
Albumin: 4.4 g/dL (ref 3.5–5.2)
Alkaline Phosphatase: 72 U/L (ref 39–117)
BUN: 14 mg/dL (ref 6–23)
CO2: 27 meq/L (ref 19–32)
Calcium: 9.6 mg/dL (ref 8.4–10.5)
Chloride: 104 meq/L (ref 96–112)
Creatinine, Ser: 0.68 mg/dL (ref 0.40–1.20)
GFR: 99.54 mL/min (ref >60.00)
Glucose, Bld: 96 mg/dL (ref 70–99)
Potassium: 4 meq/L (ref 3.5–5.1)
Sodium: 140 meq/L (ref 135–145)
Total Bilirubin: 0.4 mg/dL (ref 0.2–1.2)
Total Protein: 7.1 g/dL (ref 6.0–8.3)

## 2024-10-04 LAB — SEDIMENTATION RATE: Sed Rate: 21 mm/h (ref 0–30)

## 2024-10-04 MED ORDER — AMBULATORY NON FORMULARY MEDICATION: 1 refills | Status: AC

## 2024-10-04 NOTE — Patient Instructions (Addendum)
 Your provider has requested that you go to the basement level for lab work before leaving today. Press B on the elevator. The lab is located at the first door on the left as you exit the elevator.  Due to recent changes in healthcare laws, you may see the results of your imaging and laboratory studies on MyChart before your provider has had a chance to review them.  We understand that in some cases there may be results that are confusing or concerning to you. Not all laboratory results come back in the same time frame and the provider may be waiting for multiple results in order to interpret others.  Please give us  48 hours in order for your provider to thoroughly review all the results before contacting the office for clarification of your results.    Diltiazem/lidocaine  3 x daily for 2 months sent to compound pharmacy  NTG 1.25% use gloved hand, apply pea size amount every 6-8 hours for pain rectally, #30 gram no refills.    Sent this medication to a compound pharmacy: [facility] [address], [place], [place] [address]  [phone]  Please DO NOT go directly from our office to pick up this medication! Give the pharmacy 1 day to process the prescription. Extra time is required for them to compound your medication.   Your provider has ordered Diatherix stool testing for you. You have received a kit from our office today containing all necessary supplies to complete this test. Please carefully read the stool collection instructions provided in the kit before opening the accompanying materials. In addition, be sure there is a label providing your full name and date of birth on the puritan opti-swab tube that is supplied in the kit (if you do not see a label with this information on your test tube, please make us  aware before test collection!). After completing the test, you should secure the purtian tube into the specimen biohazard bag. [facility] E-Req sheet  (including date and time of specimen collection) should be placed into the outside pocket of the specimen biohazard bag and returned to the [facility] lab (basement floor of [facility]) within 3 days of collection. Please make sure to give the specimen to a staff member at the lab. DO NOT leave the specimen on the counter.  If the specimen date and time (can be found in the upper right boxed portion of the sheet) are not filled out on the E-Req sheet, the test will NOT be performed.    You have been scheduled for an endoscopy. Please follow written instructions given to you at your visit today.  If you use inhalers (even only as needed), please bring them with you on the day of your procedure.  If you take any of the following medications, they will need to be adjusted prior to your procedure:   DO NOT TAKE 7 DAYS PRIOR TO TEST- Trulicity (dulaglutide) Ozempic, Wegovy (semaglutide) Mounjaro, Zepbound (tirzepatide) Bydureon Bcise (exanatide extended release)  DO NOT TAKE 1 DAY PRIOR TO YOUR TEST Rybelsus (semaglutide) Adlyxin (lixisenatide) Victoza (liraglutide) Byetta (exanatide) ___________________________________________________________________________   Thank you for trusting me with your gastrointestinal care!  Alan Coombs, PA-C  _______________________________________________________  If your blood pressure at your visit was 140/90 or greater, please contact your primary care physician to follow up on this.  _______________________________________________________  If you are age 38 or older, your body mass index should be between 23-30. Your Body mass index is 45.63  kg/m. If this is out of the aforementioned range listed, please consider follow up with your Primary Care Provider.  If you are age 91 or younger, your body mass index should be between 19-25. Your Body mass index is 45.63 kg/m. If this is out of the aformentioned range listed, please  consider follow up with your Primary Care Provider.   ________________________________________________________  The [facility] providers would like to encourage you to use MYCHART to communicate with providers for non-urgent requests or questions.  Due to long hold times on the telephone, sending your provider a message by [facility] may be a faster and more efficient way to get a response.  Please allow 48 business hours for a response.  Please remember that this is for non-urgent requests.  _______________________________________________________  [facility] is using a team-based approach to care.  Your team is made up of your doctor and two to three APPS. Our APPS (Nurse Practitioners and Physician Assistants) work with your physician to ensure care continuity for you. They are fully qualified to address your health concerns and develop a treatment plan. They communicate directly with your gastroenterologist to care for you. Seeing the Advanced Practice Practitioners on your physician's team can help you by facilitating care more promptly, often allowing for earlier appointments, access to diagnostic testing, procedures, and other specialty referrals.     Understanding Your Weekly GLP-1 Injection  A helpful guide to managing common side effects  You are on a once-weekly injectable medication in the GLP-1 receptor agonist class. These medications can be very effective for blood sugar control, weight loss, and heart protection, fatty liver or OSA, but they can also come with some side effects that are important to understand. The good news is: most side effects can be managed with a few adjustments.  1. Gastroparesis-Like Symptoms These medications slow down your stomach to help you feel full longer -- great for weight loss and blood sugar control, but they can sometimes cause symptoms that feel like gastroparesis (slow stomach emptying). Symptoms may include: -Feeling full quickly when  eating -Nausea or vomiting -Bloating or abdominal discomfort -Worsening heartburn or reflux -Acid regurgitation -Stomach spasms or tightness What you can do: ??? Eat small, frequent meals (4-6 per day) ?? Drink fluids between meals, not during ?? Avoid high-fiber foods (like raw veggies or whole grains); cook your veggies well ?? Spread protein throughout the day (try Greek yogurt, eggs, soft meats, Glucerna, milk) ?? Choose soft foods you can mash with a fork ?? Switch to pured foods or liquids during flare-ups ?? Consider reading: Living Well with Gastroparesis by Camelia Bone ?? Downloadable Diet Guide: [facility] Gastroparesis Diet PDF  ?? Tip: Try following a gastroparesis-friendly diet on the day of your injection and the day after, when the medication's effect is strongest.  2. Constipation Since this medication slows down your digestive system, constipation is very common. Tips to help: ?? Drink plenty of water ???? Stay active with regular exercise ?? Add fiber-rich but gentle foods like kiwi ?? Try a low-dose magnesium supplement at night ?? Use MiraLAX (half to one capful daily) if constipation becomes more frequent, especially if your dose increases  If these strategies don't help, talk to your provider -- they may recommend or prescribe other treatments.  3. When to Call the Doctor or Go to the ER While rare, this medication can slightly increase your risk of serious conditions like: Pancreatitis (inflammation of the pancreas) Gallstones or gallbladder problems Watch for these  signs and seek help if you experience: Severe abdominal pain (especially in the upper belly or that radiates to your back) Pain in the right upper side of your abdomen Nausea, vomiting, fever, or chills that don't go away ?? Call your provider or go to the ER if these occur.  4. Who Should NOT Take This Medication? This medication should be avoided if you have: A personal  history of pancreatitis  A personal or family history of medullary thyroid cancer A condition called Multiple Endocrine Neoplasia Syndrome Type 2 (MEN2)  Final Note If the side effects are too bothersome, remember: Most symptoms will go away if you stop the medication. But many people tolerate it well after the first few weeks, especially with the right strategies in place.  Anal Fissure, Adult  A fissure is a linear defect in the anal mucosa, symptoms include burning, itching, discomfort especially with a bowel movement with associated rectal bleeding.  Risk factors include low fiber diet, chronic constipation and straining. Anal fissures can take a very long time to heal so this will be a 2 to 78-month process.  Treatment for a fissure includes:  -decreasing time in the toilet should not be more than 5 minutes -adding fiber supplement such as Benefiber or Citrucel -increasing water. -There is a lubricating suppository over-the-counter called Calmol-4 you can get from [place] or from your pharmacy (may have to order) that I want to do twice daily morning and evening for 8 weeks.  This is a 60 to 80% success rate. -I am also going to send in a calcium channel blocker cream to a compound pharmacy, apply twice daily for 12 weeks. If after 3 months this is not helpful then we will we will refer you to general surgery for evaluation, they can do Botox injections under anesthesia or surgery.   TYLENOL  (ACETAMINOPHEN ) IS SAFE IN LIVER DISEASE: You can take tylenol  (acetaminophen ) up to 2,000 mg/day. This would be four extra strength (500mg ) tablets over 24 hours OR six regular strength (325 mg) tablets over 24 hours. Please be sure to read the ingredients of over the counter medications and prescription pain medications as many contain acetaminophen .   NO NSAIDS (ibuprofen, advil, naproxen, aleve, motrin...)  HEPATIC ENCEPHALOPATHY HEPATIC ENCEPHALOPATHY: Confusion caused by a build up of toxins  in the blood due to the liver not being able to filter toxins. This can cause confusion mild or severe, increase falls. If you have been diagnosed with Hepatic encephalopathy, advised to not drive due to increased risk   LACTULOSE:  helps pull ammonia and other toxins from the blood into your stool when you have a bowel movement NO NEED TO CHECK AMMONIA LEVEL IN BLOOD! Only need to check if you are having symptoms. 30-74ml up to four times a day Take a dose in the morning If by lunch time you have not had AT LEAST 2 bowel movements take another dose If by dinner you have not had AT LEAST 2 bowel movements that day take another dose If by bedtime you still have not had 2 bowel movements take another dose Goal of 3-4 bowel movements per day Avoid taking with food as this will cause more gas  IF YOU ARE VERY SLEEPY, HARD FOR YOUR FAMILY TO WAKE YOU UP, OR FALLING ASLEEP DURING CONVERSATIONS -INCREASE LACTULOSE/GO TO THE EMERGENCY ROOM  XIFAXAN (rifaximin) An antibiotic that helps limit excess bacteria in the intestine.  The bacteria can cause increased ammonia One 550mg  tablet twice daily  SUPPLEMENTS: multivitamin Zinc Branch Chain Amino acids (BCAA): 12 grams/day L-Ornithine L-Aspartate (LOLA): 6 grams three times/day ([email or web address]) L-Carnitine  PHYSICAL ACTIVITY It is important to continue to be active when you have cirrhosis. Exercise will help reduce muscle loss and weakness.  DISCUSS REFERRAL FOR PHYSICAL THERAPY WITH YOUR PRIMARY CARE PROVIDER  DIET/NUTRITION FOR CIRRHOSIS NO ALCOHOL YOUR GOALS Evening snack - high protein Supplements between meals to help meet calorie and protein goal: Boost Ensure Premier Protein Shakes Protein Greek yogurt Fish, chicken (NO RAW OR UNDERCOOKED FISH/SHELLFISH) Avoid pork and red meat Plant based protein (non-soy)/Vegan: Lentils, Chickpeas, Peanuts (non salted), almonds (non salted), quinoa, chia seeds  Plant based  protein supplements (not soy)  Avoid/limit animal based protein supplements: whey, casein 4.  Low sodium (2,000 mg/day) A. Avoid: table salt, canned foods, deli meats, sausages, hot dogs, anything with a long shelf life B. Read nutrition labels and be aware of serving size. Don't go by percent of daily value.

## 2024-10-06 LAB — AFP TUMOR MARKER: AFP-Tumor Marker: 2.8 ng/mL

## 2024-10-11 DIAGNOSIS — K580 Irritable bowel syndrome with diarrhea: Secondary | ICD-10-CM

## 2024-10-11 MED ORDER — HYDROCORTISONE (PERIANAL) 2.5 % EX CREA: 1.0000 | TOPICAL_CREAM | Freq: Two times a day (BID) | CUTANEOUS | 2 refills | Status: AC

## 2024-10-12 ENCOUNTER — Other Ambulatory Visit: Payer: Self-pay | Admitting: Physician Assistant

## 2024-10-12 MED ORDER — AMITRIPTYLINE HCL 10 MG PO TABS: 10.0000 mg | ORAL_TABLET | Freq: Every day | ORAL | 0 refills | Status: DC

## 2024-10-12 NOTE — Telephone Encounter (Signed)
 Diatherix negative for infection Negative C. difficile Campylobacter E. coli, Shigella, Salmonella

## 2024-10-12 NOTE — Addendum Note (Signed)
 Addended by: CRAIG PALMA on: 10/12/2024 11:38 AM   Modules accepted: Orders

## 2024-10-13 ENCOUNTER — Ambulatory Visit (INDEPENDENT_AMBULATORY_CARE_PROVIDER_SITE_OTHER): Admitting: Psychology

## 2024-10-13 DIAGNOSIS — F50811 Binge eating disorder, moderate: Secondary | ICD-10-CM

## 2024-10-13 DIAGNOSIS — F330 Major depressive disorder, recurrent, mild: Secondary | ICD-10-CM

## 2024-10-13 DIAGNOSIS — F419 Anxiety disorder, unspecified: Secondary | ICD-10-CM

## 2024-10-14 NOTE — Progress Notes (Signed)
 [facility] Counselor/Therapist Progress Note  Patient ID: Jacqueline Fowler, MRN: [id number],    Date: 10/13/2024  Time Spent: 60 minutes  Time in:  12:00 Time out: 1:00   Treatment Type: Individual Therapy  Reported Symptoms: depression  Mental Status Exam: Appearance:  Casual     Behavior: Appropriate  Motor: Normal  Speech/Language:  Normal Rate  Affect: Blunt  Mood: sad  Thought process: normal  Thought content:   WNL  Sensory/Perceptual disturbances:   WNL  Orientation: oriented to person, place, time/date, and situation  Attention: Good  Concentration: Good  Memory: WNL  Fund of knowledge:  Good  Insight:   Good  Judgment:  Good  Impulse Control: Good   Risk Assessment: Danger to Self:  No Self-injurious Behavior: No Danger to Others: No Duty to Warn:no Physical Aggression / Violence:No  Access to Firearms a concern: No  Gang Involvement:No   Subjective: The patient attended a face-to-face individual therapy session in the office today.  The patient reports that she has been having more sadness lately.  We decided to go ahead and do the EMDR today and we focused on the negative cognition of I am not worthy.  The patient was emotional during the session today and having difficulty being positive.  Her negative emotions are generally her go to. We worked on installing the puzzle cognition of I am worthy of being loved.  The patient reported a suds score in the beginning of the session of an 8 decrease to a 2 by the end of the session.  Asked her to notice if she felt any differently between now and her next session and that she can call me if she feels the need to come in. Interventions: Cognitive Behavioral Therapy  Diagnosis:Anxiety disorder, unspecified type  Mild episode of recurrent major depressive disorder  Moderate binge-eating disorder   Plan: Client Abilities/Strengths  Intelligent, motivated, insightful  Client Treatment Preferences   Outpatient Individual therapy  Client Statement of Needs  I need some help with my depression  Treatment Level  Outpatient Individual therapy  Symptoms  Depressed or irritable mood.:  (Status: Improved). Feelings of hopelessness,  worthlessness, or inappropriate guilt.: (Status: maintained). History of chronic  or recurrent depression for which the client has taken antidepressant medication, been hospitalized, had outpatient treatment, or had a course of electroconvulsive therapy.: (Status:  maintained). Lack of energy.:  (Status: maintained). Low self-esteem.:  (Status: maintained). Poor concentration and indecisiveness.: (Status: maintained). Sleeplessness or hypersomnia.: (Status:  maintained). Social withdrawal.: (Status: maintained).  Problems Addressed  Unipolar Depression, Unipolar Depression    Goals 1. Develop healthy interpersonal relationships that lead to the alleviation  and help prevent the relapse of depression. 2. Develop healthy thinking patterns and beliefs about self, others, and the world that lead to the alleviation and help prevent the relapse of  depression. Objective Verbalize an understanding of healthy and unhealthy emotions with the intent of increasing the use of  healthy emotions to guide actions. Target Date: 2025-05-01 Frequency: biWeekly Progress: 70 Modality: individual Objective Learn and implement behavioral strategies to overcome depression. Target Date: 2025-05-01 Frequency: biWeekly Progress: 70 Modality: individual Related Interventions 1. Assist the client in developing skills that increase the likelihood of deriving pleasure from  behavioral activation (e.g., assertiveness skills, developing an exercise plan, less internal/more  external focus, increased social involvement); reinforce success. Objective Describe current and past experiences with depression including their impact on functioning and  attempts to resolve it. Target Date:  2025-05-01  Frequency: biWeekly Progress: 60 Modality: individual Related Interventions 1. Encourage the client to share his/her thoughts and feelings of depression; express empathy and  build rapport while identifying primary cognitive, behavioral, interpersonal, or other  contributors to depression.  Objective Learn and implement problem-solving and decision-making skills. Target Date: 2025-05-01 Frequency: biWeekly Progress: 60 Modality: individual Related Interventions 1. Encourage in the client the development of a positive problem orientation in which problems  and solving them are viewed as a natural part of life and not something to be feared, despaired,  or avoided. Objective Identify and replace thoughts and beliefs that support depression. Target Date: 2025-05-01 Frequency: biWeekly Progress: 30 Modality: individual Related Interventions 1. Conduct Cognitive-Behavioral Therapy (see Cognitive Behavior Therapy by Almarie; Overcoming Depression by Marine dunker al.), beginning with helping the client learn the connection among  cognition, depressive feelings, and actions. 2. Facilitate and reinforce the client's shift from biased depressive self-talk and beliefs to realitybased cognitive messages that enhance self-confidence and increase adaptive actions (see  Positive Self-Talk in the Adult Psychotherapy Homework Planner by Jenniffer). Diagnosis 296.32 (Major depressive affective disorder, recurrent episode, moderate) - Open -  [Signifier: n/a]  Conditions For Discharge Achievement of treatment goals and objectives Will continue to see the patient at least biweekly and work with her using CBT, Insight oriented approach and EMDR.  Patient approved the treatment plan.  Camri Molloy G Laretha Luepke, LCSW

## 2024-10-15 ENCOUNTER — Ambulatory Visit: Admitting: Psychology

## 2024-10-21 ENCOUNTER — Encounter: Payer: Self-pay | Admitting: Gastroenterology

## 2024-10-23 ENCOUNTER — Other Ambulatory Visit: Payer: Self-pay | Admitting: Physician Assistant

## 2024-10-23 DIAGNOSIS — F4010 Social phobia, unspecified: Secondary | ICD-10-CM

## 2024-10-23 DIAGNOSIS — F411 Generalized anxiety disorder: Secondary | ICD-10-CM

## 2024-10-25 MED ORDER — PROPRANOLOL HCL 10 MG PO TABS: ORAL_TABLET | ORAL | 1 refills | Status: AC

## 2024-10-28 ENCOUNTER — Ambulatory Visit: Admitting: Gastroenterology

## 2024-10-28 VITALS — BP 117/93 | HR 74 | Temp 97.3°F | Resp 11 | Ht 64.75 in | Wt 272.2 lb

## 2024-10-28 DIAGNOSIS — R1310 Dysphagia, unspecified: Secondary | ICD-10-CM

## 2024-10-28 DIAGNOSIS — K7469 Other cirrhosis of liver: Secondary | ICD-10-CM

## 2024-10-28 DIAGNOSIS — K319 Disease of stomach and duodenum, unspecified: Secondary | ICD-10-CM

## 2024-10-28 DIAGNOSIS — K449 Diaphragmatic hernia without obstruction or gangrene: Secondary | ICD-10-CM | POA: Diagnosis not present

## 2024-10-28 DIAGNOSIS — I8500 Esophageal varices without bleeding: Secondary | ICD-10-CM

## 2024-10-28 DIAGNOSIS — K2970 Gastritis, unspecified, without bleeding: Secondary | ICD-10-CM

## 2024-10-28 MED ORDER — SODIUM CHLORIDE 0.9 % IV SOLN: 500.0000 mL | Freq: Once | INTRAVENOUS | Status: AC

## 2024-10-28 NOTE — Progress Notes (Signed)
 10/04/2024 Jacqueline Fowler [id number] Oct 13, 1971   Referring provider: Pia Kerney SQUIBB, MD Primary GI doctor: Dr. Charlanne   ASSESSMENT AND PLAN:  Cirrhosis secondary to [facility] 08/04/2023 WBC 5.7 HGB 13.6 Platelets 194.0 08/04/2023 AST 29 ALT 23 Alkphos 68 TBili 0.4 08/04/2023 INR 1.1 MELD 3.0: 7 at 10/04/2024 10:06 AM MELD-Na: 6 at 10/04/2024 10:06 AM Calculated from: Serum Creatinine: 0.68 mg/dL (Using min of 1 mg/dL) at 88/75/7974 89:93 AM Serum Sodium: 140 mEq/L (Using max of 137 mEq/L) at 10/04/2024 10:06 AM Total Bilirubin: 0.4 mg/dL (Using min of 1 mg/dL) at 88/75/7974 89:93 AM Serum Albumin: 4.4 g/dL (Using max of 3.5 g/dL) at 88/75/7974 89:93 AM INR(ratio): 1 ratio at 10/04/2024 10:06 AM Age at listing (hypothetical): 53 years Sex: Female at 10/04/2024 10:06 AM   Ascites:      No history -Nutrition and low sodium diet discussed with patient and information given - appears euvolemic Varices screening / surveillance EGD:     Last EGD 2022 No history of varices. On prophylaxis propanolol 10 mg BID, may need to increase dose Hepatic encephalopathy:  Pt does not report any symptoms consistent with HE and no asterixis on exam.  Most recent HCC screening:    09/28/2024 MRI abdomen with and without contrast cirrhosis sequelae of portal hypertension no evidence of HCC status post cholecystectomy splenomegaly small hiatal hernia no ascites small caliber upper abdominal varices Last AFP 08/04/2023 3.6  Repeat AFP Recall HCC 6 months Provided general information to the patient: -Continue daily multivitamin -Recommended 30 minutes of aerobic and resistance exercise 3 days/week -Encouraged pt to increase protein intake   GERD with history of esophageal stenosis S/p cholecystectomy  06/13/2021 EGD status post dilatation negative EOE small HH gastritis negative HP normal duodenum negative celiac 2024 UGI severe repeat episodes of gastrointestinal reflux small mixed type hiatal hernia  mildly distended proximal small bowel loops likely insignificant given brisk travel of contrast Has been on Ozempic for several months having worsening reflux especially at night, nausea, severe GERD occasionally dark stools. Patient is on propranolol  for prophylaxis against varices however MRI shows new portal hypertension and with the symptoms we will plan on EGD to evaluate - Continue pantoprazole  40 mg twice daily, add Pepcid  40 mg at night - Continue with weight loss - Given information about gastroparesis diet with GLP-1 and information about GLP-1   History of tubular adenomatous polyps Next colonoscopy recommended in March 2027    IBS/abdominal pain- D Loose BM, can be up to 8 times a day Can have nocturnal symptoms the last 2 nights Denies fever, chills, ABX several months ago No new medications - Diatherix here in the office to rule out infection - Will get sed rate, CRP - Consider trial of Xifaxan - Consider trial of amitriptyline  10 mg at night - FODMAP diet given   Rectal pain with burning, severe pain, rectal bleeding Has obvious thrombosed external hemorrhoid and posterior fissure -Refer to surgery for external hemorrhoid - squatty potty, etc - hemorroid cream - given diltiazem/lidocaine  for fissure   Morbid obesity  Body mass index is 45.63 kg/m.  -Patient has been advised to make an attempt to improve diet and exercise patterns to aid in weight loss. -Recommended diet heavy in fruits and veggies and low in animal meats, cheeses, and dairy products, appropriate calorie intake     Patient Care Team: Pia Kerney SQUIBB, MD as PCP - General (Internal Medicine)   HISTORY OF PRESENT ILLNESS: 53 y.o. 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   Cirrhosis history  The following data was reviewed at the time of this encounter: Serologic workup: 04/2021 negative other than ANA +1: 160     Latest Ref Rng & Units 08/04/2023   12:35 04/21/2023    10:20 10/17/2022   14:55  Hep C Labs  Hemoglobin 12.0 - 15.0 g/dL 86.3  85.9  85.5   Platelets 150.0 - 400.0 K/uL 194.0  221.0  273.0   WBC 4.0 - 10.5 K/uL 5.7  5.4  7.5   AST 0 - 37 U/L 29  27  27    ALT 0 - 35 U/L 23  21  23    Albumin 3.5 - 5.2 g/dL 4.0  4.2  4.4   Creatinine 0.40 - 1.20 mg/dL 9.22  9.26  9.22   INR 0.8 - 1.0 ratio 1.1    1.0     Last EGD 06/2021 no varices on prophylaxis propranolol  10 mg twice daily Last HCC screen: 10/21/2022 MR abdomen with without contrast stable size and appearance of LR3 lesion Last AFP 08/04/2023 3.6     Wt Readings from Last 3 Encounters:  10/04/24 272 lb 2 oz (123.4 kg)  08/04/23 274 lb (124.3 kg)  10/17/22 278 lb 9.6 oz (126.4 kg)    History of Present Illness   Jacqueline Fowler is a 53 year old female with cirrhosis who presents with rectal pain and gastrointestinal symptoms.   She experiences severe rectal pain described as a razor-like sensation, causing significant distress. The pain is associated with bowel movements and sitting, accompanied by bright red bleeding during bowel movements. These symptoms have been present for the past two to three days. Frequent bowel movements, up to eight times a day, exacerbate the pain.   She has a history of irritable bowel syndrome and takes medication for it, but continues to experience daily issues. She reports loose bowel movements, sometimes up to eight times a day, and has had nocturnal symptoms, waking up twice last night to use the bathroom. No fever, chills, or weight loss. She recalls taking antibiotics for a sinus infection a few months ago.   She has gastroesophageal reflux disease and takes rabeprazole  and famotidine , but still requires Tums or Mylanta at night. Reflux occurs primarily at night, which has improved with rabeprazole , reducing episodes of coughing and acid regurgitation. No food or pills getting caught in her throat.   She reports that her recent MRI was compared to prior studies  and did not show any concerning findings. She takes propranolol  10 mg twice daily. Sometimes notices dark green/black stools. No abdominal or leg swelling.   She had a colonoscopy in 2020 which revealed tubular adenomatous polyps and internal hemorrhoids.     Social history:  She  reports that she has quit smoking. She has never used smokeless tobacco. She reports that she does not currently use alcohol after a past usage of about 1.0 standard drink of alcohol per week. She reports that she does not use drugs.   RELEVANT GI HISTORY, LABS, IMAGING:   CBC Labs (Brief)          Component Value Date/Time    WBC 5.7 08/04/2023 1235    RBC 4.76 08/04/2023 1235    HGB 13.6 08/04/2023 1235    HGB 14.0 08/14/2022 1100    HCT 40.8 08/04/2023 1235    HCT 42.7 08/14/2022 1100    PLT 194.0 08/04/2023 1235    PLT 217  08/14/2022 1100    MCV 85.8 08/04/2023 1235    MCV 85 08/14/2022 1100    MCH 27.8 08/14/2022 1100    MCHC 33.2 08/04/2023 1235    RDW 14.8 08/04/2023 1235    RDW 15.1 08/14/2022 1100    LYMPHSABS 1.4 08/04/2023 1235    LYMPHSABS 1.4 08/14/2022 1100    MONOABS 0.3 08/04/2023 1235    EOSABS 0.0 08/04/2023 1235    EOSABS 0.1 08/14/2022 1100    BASOSABS 0.0 08/04/2023 1235    BASOSABS 0.0 08/14/2022 1100      Recent Labs (within last 365 days)  No results for input(s): HGB in the last 8760 hours.     CMP     Labs (Brief)          Component Value Date/Time    NA 139 08/04/2023 1235    NA 138 08/14/2022 1100    K 4.0 08/04/2023 1235    CL 106 08/04/2023 1235    CO2 25 08/04/2023 1235    GLUCOSE 117 (H) 08/04/2023 1235    BUN 13 08/04/2023 1235    BUN 15 08/14/2022 1100    CREATININE 0.77 08/04/2023 1235    CALCIUM 9.3 08/04/2023 1235    PROT 6.6 08/04/2023 1235    PROT 7.1 08/14/2022 1100    ALBUMIN 4.0 08/04/2023 1235    ALBUMIN 4.6 08/14/2022 1100    AST 29 08/04/2023 1235    ALT 23 08/04/2023 1235    ALKPHOS 68 08/04/2023 1235    BILITOT 0.4 08/04/2023  1235    BILITOT 0.5 08/14/2022 1100          Latest Ref Rng & Units 08/04/2023   12:35 PM 04/21/2023   10:20 AM 10/17/2022    2:55 PM  Hepatic Function  Total Protein 6.0 - 8.3 g/dL 6.6  6.8  7.4   Albumin 3.5 - 5.2 g/dL 4.0  4.2  4.4   AST 0 - 37 U/L 29  27  27    ALT 0 - 35 U/L 23  21  23    Alk Phosphatase 39 - 117 U/L 68  74  81   Total Bilirubin 0.2 - 1.2 mg/dL 0.4  0.4  0.5         Latest Ref Rng & Units 10/17/2022    2:55 PM 04/21/2023   10:20 AM 08/04/2023   12:35 PM  Hepatitis C  AFP ng/mL 4.0  3.8  3.6       Current Medications:    Current Outpatient Medications (Endocrine & Metabolic):    metFORMIN  (GLUCOPHAGE -XR) 500 MG 24 hr tablet, SMARTSIG:2 Tablet(s) By Mouth Every Evening     Current Outpatient Medications (Cardiovascular):    propranolol  (INDERAL ) 10 MG tablet, TAKE 1 TO 2 TABLETS BY MOUTH TWICE A DAY AS NEEDED FOR ANXIETY (Patient taking differently: Take 10 mg by mouth 2 (two) times daily. TAKE 1 TO 2 TABLETS BY MOUTH TWICE A DAY AS NEEDED FOR ANXIETY)         Current Outpatient Medications (Analgesics):    acetaminophen  (TYLENOL ) 500 MG tablet, Take 500 mg by mouth as needed.   Oxycodone HCl 10 MG TABS, Take 10 mg by mouth daily as needed.   Ubrogepant (UBRELVY) 100 MG TABS, 1 tablet as needed, may take second dose at least 2 hours after first dose up to 2 tablets per day as needed Orally Once a day; Duration: 30 days         Current Outpatient Medications (Other):  Acetylcysteine (NAC PO), Take 1-2 tablets by mouth daily.   AMBULATORY NON FORMULARY MEDICATION, Medication Name: Diltiazem 2%/Lidocaine  2% Using your index finger apply a small amount of medication inside the anal opening and to the external anal area twice daily x 6 weeks.   Bismuth Subsalicylate (PEPTO BISMOL PO), Take 1 Dose by mouth as needed.   brexpiprazole  (REXULTI ) 2 MG TABS tablet, Take 1 tablet (2 mg total) by mouth daily.   busPIRone  (BUSPAR ) 30 MG tablet, Take 1 tablet (30 mg  total) by mouth 2 (two) times daily.   Calcium & Magnesium Carbonates (MYLANTA PO), Take 1 Dose by mouth as needed.   Calcium Carbonate Antacid (TUMS PO), Take 2-3 tablets by mouth as needed.   clonazePAM  (KLONOPIN ) 1 MG tablet, Take 1/2-1 tablet twice daily as needed for panic   cyclobenzaprine (FLEXERIL) 5 MG tablet, Take 5 mg by mouth 3 (three) times daily.   dicyclomine  (BENTYL ) 20 MG tablet, TAKE 1 TABLET (20 MG TOTAL) BY MOUTH 4 (FOUR) TIMES DAILY - BEFORE MEALS AND AT BEDTIME.   famotidine  (PEPCID ) 40 MG tablet, TAKE 1 TABLET (40 MG TOTAL) BY MOUTH 2 (TWO) TIMES DAILY. EVERY MORNING AND AT BEDTIME   gabapentin (NEURONTIN) 300 MG capsule, Take 300 mg by mouth daily.   lamoTRIgine  (LAMICTAL ) 100 MG tablet, Take 1 tablet (100 mg total) by mouth daily.   loperamide (IMODIUM) 2 MG capsule, Take by mouth as needed for diarrhea or loose stools.   ondansetron  (ZOFRAN -ODT) 4 MG disintegrating tablet, Take 1 tablet (4 mg total) by mouth every 8 (eight) hours as needed for nausea or vomiting.   RABEprazole  (ACIPHEX ) 20 MG tablet, Take 20 mg by mouth daily.   SEMAGLUTIDE-WEIGHT MANAGEMENT [place], Inject into the skin.   sertraline  (ZOLOFT ) 100 MG tablet, Take 1 tablet (100 mg total) by mouth in the morning and at bedtime.   Current Facility-Administered Medications (Other):    0.9 %  sodium chloride  infusion   Medical History:      Past Medical History:  Diagnosis Date   Anxiety     B12 deficiency     Back pain     Chest pain     Cirrhosis of liver (HCC)     Depression     Fatty liver     Gallbladder problem     Gallstones 2004   GERD (gastroesophageal reflux disease)     History of stomach ulcers     IBS (irritable bowel syndrome)     Joint pain     Obesity     Pre-diabetes     Swallowing difficulty     UTI (urinary tract infection)     Vitamin D  deficiency          Allergies:  Allergies       Allergies  Allergen Reactions   Ibuprofen Other (See Comments)      Has fatty liver  and cant take        Surgical History:  She  has a past surgical history that includes Breast biopsy (Right); IUD removal; Cholecystectomy (2004); Colonoscopy (08/07/2009); Esophagogastroduodenoscopy (05/31/2008); Upper gastrointestinal endoscopy; esophagus stretching; tummy tuck (03/20/2020); Repair rectocele; and Total vaginal hysterectomy. Family History:  Her family history includes Anxiety disorder in her father and mother; Depression in her father, mother, and son; Heart disease in her father.   REVIEW OF SYSTEMS  : All other systems reviewed and negative except where noted in the History of Present Illness.   PHYSICAL EXAM: BP 110/86 (  BP Location: Left Wrist, Patient Position: Sitting)   Pulse 82   Ht 5' 4.75 (1.645 m) Comment: height measured without shoes  Wt 272 lb 2 oz (123.4 kg)   LMP 05/15/2021 (Approximate)   BMI 45.63 kg/m  Physical Exam   GENERAL APPEARANCE: Well nourished, in no apparent distress. HEENT: No cervical lymphadenopathy, unremarkable thyroid, sclerae anicteric, conjunctiva pink. RESPIRATORY: Respiratory effort normal, breath sounds equal bilaterally without rales, rhonchi, or wheezing. CARDIO: Regular rate and rhythm with no murmurs, rubs, or gallops, peripheral pulses intact. ABDOMEN: Soft, non-distended, active bowel sounds in all four quadrants, no tenderness to palpation, no rebound, no mass appreciated. RECTAL: External hemorrhoid and posterior anal fissure present. MUSCULOSKELETAL: Full range of motion, normal gait, without edema. SKIN: Dry, intact without rashes or lesions. No jaundice. NEURO: Alert, oriented, no focal deficits. PSYCH: Cooperative, normal mood and affect.             Alan JONELLE Coombs, PA-C    Attending physician's note   I have taken history, reviewed the chart and examined the patient. I performed a substantive portion of this encounter, including complete performance of at least one of the key components, in conjunction  with the APP. I agree with the Advanced Practitioner's note, impression and recommendations.   EGD today   Anselm Bring, MD [facility] [phone]

## 2024-10-28 NOTE — Progress Notes (Signed)
 Transferred to PACU via stretcher.  Not responding to stimulation at this time.  VSS upon leaving procedure room.

## 2024-10-28 NOTE — Patient Instructions (Signed)
 YOU HAD AN ENDOSCOPIC PROCEDURE TODAY AT THE [facility]:   Refer to the procedure report that was given to you for any specific questions about what was found during the examination.  If the procedure report does not answer your questions, please call your gastroenterologist to clarify.  If you requested that your care partner not be given the details of your procedure findings, then the procedure report has been included in a sealed envelope for you to review at your convenience later.  YOU SHOULD EXPECT: Some feelings of bloating in the abdomen. Passage of more gas than usual.  Walking can help get rid of the air that was put into your GI tract during the procedure and reduce the bloating. If you had a lower endoscopy (such as a colonoscopy or flexible sigmoidoscopy) you may notice spotting of blood in your stool or on the toilet paper. If you underwent a bowel prep for your procedure, you may not have a normal bowel movement for a few days.  Please Note:  You might notice some irritation and congestion in your nose or some drainage.  This is from the oxygen used during your procedure.  There is no need for concern and it should clear up in a day or so.  SYMPTOMS TO REPORT IMMEDIATELY:   Following upper endoscopy (EGD)  Vomiting of blood or coffee ground material  New chest pain or pain under the shoulder blades  Painful or persistently difficult swallowing  New shortness of breath  Fever of 100F or higher  Black, tarry-looking stools  Resume previous diet Continue present medications including propranolol .  At follow up, consider increasing propranolol  if hear rate/ blood pressure permits Continue Protonix  Await pathology results Avoid ibuprofen, naproxen or other non steroidal anti inflammatory drugs Follow up in the GI clinic in 4-6 weeks.  For urgent or emergent issues, a gastroenterologist can be reached at any hour by calling [phone]. Do not use MyChart  messaging for urgent concerns.    DIET:  We do recommend a small meal at first, but then you may proceed to your regular diet.  Drink plenty of fluids but you should avoid alcoholic beverages for 24 hours.  ACTIVITY:  You should plan to take it easy for the rest of today and you should NOT DRIVE or use heavy machinery until tomorrow (because of the sedation medicines used during the test).    FOLLOW UP: Our staff will call the number listed on your records the next business day following your procedure.  We will call around 7:15- 8:00 am to check on you and address any questions or concerns that you may have regarding the information given to you following your procedure. If we do not reach you, we will leave a message.     If any biopsies were taken you will be contacted by phone or by letter within the next 1-3 weeks.  Please call us  at [phone] if you have not heard about the biopsies in 3 weeks.    SIGNATURES/CONFIDENTIALITY: You and/or your care partner have signed paperwork which will be entered into your electronic medical record.  These signatures attest to the fact that that the information above on your After Visit Summary has been reviewed and is understood.  Full responsibility of the confidentiality of this discharge information lies with you and/or your care-partner.

## 2024-10-28 NOTE — Op Note (Signed)
 [facility] Patient Name: Jacqueline Fowler Procedure Date: 10/28/2024 10:42 AM MRN: [id number] Endoscopist: Lynnie Bring , MD, [id number] Age: 53 Referring MD:  Date of Birth: 10-02-1971 Gender: Female Account #: [id number] Procedure:                Upper GI endoscopy Indications:              Dysphagia, MASH cirrhosis for EV screening. Medicines:                Monitored Anesthesia Care Procedure:                Pre-Anesthesia Assessment:                           - Prior to the procedure, a History and Physical                            was performed, and patient medications and                            allergies were reviewed. The patient's tolerance of                            previous anesthesia was also reviewed. The risks                            and benefits of the procedure and the sedation                            options and risks were discussed with the patient.                            All questions were answered, and informed consent                            was obtained. Prior Anticoagulants: The patient has                            taken no anticoagulant or antiplatelet agents. ASA                            Grade Assessment: III - A patient with severe                            systemic disease. After reviewing the risks and                            benefits, the patient was deemed in satisfactory                            condition to undergo the procedure.                           After obtaining informed consent, the endoscope was  passed under direct vision. Throughout the                            procedure, the patient's blood pressure, pulse, and                            oxygen saturations were monitored continuously. The                            GIF [id number] #[id number] was introduced through the                            mouth, and advanced to the second part of duodenum.                            The upper  GI endoscopy was accomplished without                            difficulty. The patient tolerated the procedure                            well. Scope In: Scope Out: Findings:                 Grade 0-I varices were found in the lower third of                            the esophagus. They were 4 mm in largest diameter.                            Too small for EVL.                           No endoscopic abnormality was evident in the                            esophagus to explain the patient's complaint of                            dysphagia. It was decided, however, to proceed with                            dilation of the entire esophagus. The scope was                            withdrawn. Dilation was performed with a Maloney                            dilator with mild resistance at 54 Fr.                           A small hiatal hernia was present. No fundal  varices.                           Localized mild inflammation characterized by                            erythema was found in the gastric antrum. Biopsies                            were taken with a cold forceps for histology.                           The examined duodenum was normal. Complications:            No immediate complications. Estimated Blood Loss:     Estimated blood loss: none. Impression:               - Grade 0-I esophageal varices.                           - Small hiatal hernia. S/P empiric esophageal                            dilatation.                           - Gastritis. Biopsied.                           - Normal examined duodenum. Recommendation:           - Patient has a contact number available for                            emergencies. The signs and symptoms of potential                            delayed complications were discussed with the                            patient. Return to normal activities tomorrow.                            Written discharge  instructions were provided to the                            patient.                           - Resume previous diet.                           - Continue present medications including                            propranolol . At follow-up consider increasing  propranolol  if HR/BP permits                           - Continue Protonix .                           - Await pathology results.                           - Avoid ibuprofen, naproxen, or other non-steroidal                            anti-inflammatory drugs.                           - FU GI clinic in 4 to 6 weeks.                           - The findings and recommendations were discussed                            with the patient's family. Lynnie Bring, MD 10/28/2024 11:08:12 AM This report has been signed electronically.

## 2024-10-28 NOTE — Progress Notes (Signed)
 Pt's states no medical or surgical changes since previsit or office visit.

## 2024-10-29 ENCOUNTER — Telehealth: Payer: Self-pay | Admitting: *Deleted

## 2024-10-29 ENCOUNTER — Encounter: Payer: Self-pay | Admitting: Physician Assistant

## 2024-10-29 NOTE — Telephone Encounter (Signed)
" °  Follow up Call-     10/28/2024    9:45 AM  Call back number  Post procedure Call Back phone  # [phone]  Permission to leave phone message Yes     Patient questions:  Do you have a fever, pain , or abdominal swelling? No. Pain Score  0 *  Have you tolerated food without any problems? Yes.    Have you been able to return to your normal activities? Yes.    Do you have any questions about your discharge instructions: Diet   No. Medications  No. Follow up visit  No.  Do you have questions or concerns about your Care? No.  Actions: * If pain score is 4 or above: No action needed, pain <4.   "

## 2024-11-01 LAB — SURGICAL PATHOLOGY

## 2024-11-05 ENCOUNTER — Other Ambulatory Visit: Payer: Self-pay | Admitting: Physician Assistant

## 2024-11-12 ENCOUNTER — Ambulatory Visit: Admitting: Psychology

## 2024-11-16 ENCOUNTER — Ambulatory Visit: Payer: Self-pay | Admitting: Psychology

## 2024-11-16 DIAGNOSIS — F419 Anxiety disorder, unspecified: Secondary | ICD-10-CM

## 2024-11-16 DIAGNOSIS — F50811 Binge eating disorder, moderate: Secondary | ICD-10-CM

## 2024-11-16 DIAGNOSIS — F330 Major depressive disorder, recurrent, mild: Secondary | ICD-10-CM

## 2024-11-16 NOTE — Progress Notes (Signed)
 " [facility] Counselor/Therapist Progress Note  Patient ID: Jacqueline Fowler, MRN: [id number],    Date: 11/16/2024  Time Spent: 60 minutes  Time in:  10:00 Time out: 11:00   Treatment Type: Individual Therapy  Reported Symptoms: depression  Mental Status Exam: Appearance:  Casual     Behavior: Appropriate  Motor: Normal  Speech/Language:  Normal Rate  Affect: Blunt  Mood: sad  Thought process: normal  Thought content:   WNL  Sensory/Perceptual disturbances:   WNL  Orientation: oriented to person, place, time/date, and situation  Attention: Good  Concentration: Good  Memory: WNL  Fund of knowledge:  Good  Insight:   Good  Judgment:  Good  Impulse Control: Good   Risk Assessment: Danger to Self:  No Self-injurious Behavior: No Danger to Others: No Duty to Warn:no Physical Aggression / Violence:No  Access to Firearms a concern: No  Gang Involvement:No   Subjective: The patient attended a face-to-face individual therapy session in the office today.  The patient reports that she was diagnosed with a melanoma and she is having surgery tomorrow to have it removed and also to do some radiation to see if it is gone to her lymph nodes.  I decided not to do a lot of strenuous work today as far as her emotional health and provided mostly supportive therapy and some grief counseling for her today.  She states that she is having the surgery tomorrow and hopefully they will remove it and get it all.  The patient needs to continue to work with me on EMDR and we do not have an appointment in February but I encouraged her to make 1 if she needed it and I will follow-up with her just to check on her after her surgery.  Interventions: Cognitive Behavioral Therapy  Diagnosis:Anxiety disorder, unspecified type  Mild episode of recurrent major depressive disorder  Moderate binge-eating disorder   Plan: Client Abilities/Strengths  Intelligent, motivated, insightful  Client  Treatment Preferences  Outpatient Individual therapy  Client Statement of Needs  I need some help with my depression  Treatment Level  Outpatient Individual therapy  Symptoms  Depressed or irritable mood.:  (Status: Improved). Feelings of hopelessness,  worthlessness, or inappropriate guilt.: (Status: maintained). History of chronic  or recurrent depression for which the client has taken antidepressant medication, been hospitalized, had outpatient treatment, or had a course of electroconvulsive therapy.: (Status:  maintained). Lack of energy.:  (Status: maintained). Low self-esteem.:  (Status: maintained). Poor concentration and indecisiveness.: (Status: maintained). Sleeplessness or hypersomnia.: (Status:  maintained). Social withdrawal.: (Status: maintained).  Problems Addressed  Unipolar Depression, Unipolar Depression    Goals 1. Develop healthy interpersonal relationships that lead to the alleviation  and help prevent the relapse of depression. 2. Develop healthy thinking patterns and beliefs about self, others, and the world that lead to the alleviation and help prevent the relapse of  depression. Objective Verbalize an understanding of healthy and unhealthy emotions with the intent of increasing the use of  healthy emotions to guide actions. Target Date: 2025-05-01 Frequency: biWeekly Progress: 70 Modality: individual Objective Learn and implement behavioral strategies to overcome depression. Target Date: 2025-05-01 Frequency: biWeekly Progress: 70 Modality: individual Related Interventions 1. Assist the client in developing skills that increase the likelihood of deriving pleasure from  behavioral activation (e.g., assertiveness skills, developing an exercise plan, less internal/more  external focus, increased social involvement); reinforce success. Objective Describe current and past experiences with depression including their impact on functioning and  attempts  to  resolve it. Target Date: 2025-05-01 Frequency: biWeekly Progress: 60 Modality: individual Related Interventions 1. Encourage the client to share his/her thoughts and feelings of depression; express empathy and  build rapport while identifying primary cognitive, behavioral, interpersonal, or other  contributors to depression.  Objective Learn and implement problem-solving and decision-making skills. Target Date: 2025-05-01 Frequency: biWeekly Progress: 60 Modality: individual Related Interventions 1. Encourage in the client the development of a positive problem orientation in which problems  and solving them are viewed as a natural part of life and not something to be feared, despaired,  or avoided. Objective Identify and replace thoughts and beliefs that support depression. Target Date: 2025-05-01 Frequency: biWeekly Progress: 30 Modality: individual Related Interventions 1. Conduct Cognitive-Behavioral Therapy (see Cognitive Behavior Therapy by Almarie; Overcoming Depression by Marine dunker al.), beginning with helping the client learn the connection among  cognition, depressive feelings, and actions. 2. Facilitate and reinforce the client's shift from biased depressive self-talk and beliefs to realitybased cognitive messages that enhance self-confidence and increase adaptive actions (see  Positive Self-Talk in the Adult Psychotherapy Homework Planner by Jenniffer). Diagnosis 296.32 (Major depressive affective disorder, recurrent episode, moderate) - Open -  [Signifier: n/a]  Conditions For Discharge Achievement of treatment goals and objectives Will continue to see the patient at least biweekly and work with her using CBT, Insight oriented approach and EMDR.  Patient approved the treatment plan.  Alixander Rallis G Mireya Meditz, LCSW                                   "

## 2024-11-30 ENCOUNTER — Ambulatory Visit: Payer: Self-pay | Admitting: Gastroenterology

## 2024-12-01 ENCOUNTER — Ambulatory Visit: Admitting: Physician Assistant

## 2024-12-07 ENCOUNTER — Other Ambulatory Visit: Payer: Self-pay | Admitting: Physician Assistant

## 2024-12-10 ENCOUNTER — Ambulatory Visit: Admitting: Psychology

## 2025-01-07 ENCOUNTER — Ambulatory Visit: Admitting: Psychology

## 2025-01-28 ENCOUNTER — Ambulatory Visit: Admitting: Psychology

## 2025-02-04 ENCOUNTER — Ambulatory Visit: Admitting: Psychology
